# Patient Record
Sex: Male | Born: 1950 | Race: Black or African American | Hispanic: No | Smoking: Former smoker
Health system: Southern US, Community
[De-identification: ages and names within clinical notes are randomized; demographics above are authoritative.]

## PROBLEM LIST (undated history)

## (undated) DIAGNOSIS — L309 Dermatitis, unspecified: Secondary | ICD-10-CM

## (undated) DIAGNOSIS — F1911 Other psychoactive substance abuse, in remission: Secondary | ICD-10-CM

## (undated) DIAGNOSIS — N529 Male erectile dysfunction, unspecified: Secondary | ICD-10-CM

## (undated) DIAGNOSIS — J45909 Unspecified asthma, uncomplicated: Secondary | ICD-10-CM

## (undated) DIAGNOSIS — J302 Other seasonal allergic rhinitis: Secondary | ICD-10-CM

## (undated) DIAGNOSIS — K219 Gastro-esophageal reflux disease without esophagitis: Secondary | ICD-10-CM

## (undated) DIAGNOSIS — Z87828 Personal history of other (healed) physical injury and trauma: Secondary | ICD-10-CM

## (undated) DIAGNOSIS — M2550 Pain in unspecified joint: Secondary | ICD-10-CM

## (undated) DIAGNOSIS — M1990 Unspecified osteoarthritis, unspecified site: Secondary | ICD-10-CM

## (undated) DIAGNOSIS — G8929 Other chronic pain: Secondary | ICD-10-CM

## (undated) DIAGNOSIS — C61 Malignant neoplasm of prostate: Secondary | ICD-10-CM

## (undated) HISTORY — DX: Malignant neoplasm of prostate: C61

## (undated) HISTORY — DX: Male erectile dysfunction, unspecified: N52.9

## (undated) HISTORY — DX: Unspecified osteoarthritis, unspecified site: M19.90

---

## 1997-11-30 ENCOUNTER — Emergency Department (HOSPITAL_COMMUNITY): Admission: EM | Admit: 1997-11-30 | Discharge: 1997-11-30 | Payer: Self-pay | Admitting: Emergency Medicine

## 1998-05-13 ENCOUNTER — Emergency Department (HOSPITAL_COMMUNITY): Admission: EM | Admit: 1998-05-13 | Discharge: 1998-05-13 | Payer: Self-pay | Admitting: Emergency Medicine

## 1998-05-13 ENCOUNTER — Encounter: Payer: Self-pay | Admitting: Emergency Medicine

## 1998-08-08 ENCOUNTER — Emergency Department (HOSPITAL_COMMUNITY): Admission: EM | Admit: 1998-08-08 | Discharge: 1998-08-08 | Payer: Self-pay | Admitting: Emergency Medicine

## 1998-08-08 ENCOUNTER — Encounter: Payer: Self-pay | Admitting: Emergency Medicine

## 1999-02-04 ENCOUNTER — Emergency Department (HOSPITAL_COMMUNITY): Admission: EM | Admit: 1999-02-04 | Discharge: 1999-02-04 | Payer: Self-pay

## 2000-10-23 ENCOUNTER — Emergency Department (HOSPITAL_COMMUNITY): Admission: EM | Admit: 2000-10-23 | Discharge: 2000-10-23 | Payer: Self-pay | Admitting: Emergency Medicine

## 2001-04-27 ENCOUNTER — Emergency Department (HOSPITAL_COMMUNITY): Admission: EM | Admit: 2001-04-27 | Discharge: 2001-04-27 | Payer: Self-pay | Admitting: Emergency Medicine

## 2001-06-29 ENCOUNTER — Emergency Department (HOSPITAL_COMMUNITY): Admission: EM | Admit: 2001-06-29 | Discharge: 2001-06-29 | Payer: Self-pay | Admitting: Emergency Medicine

## 2003-11-08 ENCOUNTER — Emergency Department (HOSPITAL_COMMUNITY): Admission: EM | Admit: 2003-11-08 | Discharge: 2003-11-08 | Payer: Self-pay | Admitting: Emergency Medicine

## 2003-12-02 ENCOUNTER — Ambulatory Visit (HOSPITAL_COMMUNITY): Admission: RE | Admit: 2003-12-02 | Discharge: 2003-12-02 | Payer: Self-pay | Admitting: Family Medicine

## 2004-01-10 ENCOUNTER — Ambulatory Visit (HOSPITAL_COMMUNITY): Admission: RE | Admit: 2004-01-10 | Discharge: 2004-01-10 | Payer: Self-pay | Admitting: Orthopedic Surgery

## 2004-01-10 ENCOUNTER — Ambulatory Visit (HOSPITAL_BASED_OUTPATIENT_CLINIC_OR_DEPARTMENT_OTHER): Admission: RE | Admit: 2004-01-10 | Discharge: 2004-01-10 | Payer: Self-pay | Admitting: Orthopedic Surgery

## 2004-01-10 HISTORY — PX: ROTATOR CUFF REPAIR: SHX139

## 2004-02-21 ENCOUNTER — Ambulatory Visit: Payer: Self-pay | Admitting: Family Medicine

## 2004-03-02 ENCOUNTER — Encounter: Admission: RE | Admit: 2004-03-02 | Discharge: 2004-05-10 | Payer: Self-pay | Admitting: Orthopedic Surgery

## 2004-03-08 ENCOUNTER — Ambulatory Visit: Payer: Self-pay | Admitting: Family Medicine

## 2004-04-03 ENCOUNTER — Ambulatory Visit: Payer: Self-pay | Admitting: *Deleted

## 2004-04-03 ENCOUNTER — Ambulatory Visit: Payer: Self-pay | Admitting: Family Medicine

## 2004-10-18 ENCOUNTER — Ambulatory Visit: Payer: Self-pay | Admitting: Family Medicine

## 2004-11-20 ENCOUNTER — Ambulatory Visit: Payer: Self-pay | Admitting: Family Medicine

## 2005-06-12 ENCOUNTER — Ambulatory Visit: Payer: Self-pay | Admitting: Family Medicine

## 2005-06-13 ENCOUNTER — Encounter (INDEPENDENT_AMBULATORY_CARE_PROVIDER_SITE_OTHER): Payer: Self-pay | Admitting: Family Medicine

## 2005-07-11 ENCOUNTER — Encounter (INDEPENDENT_AMBULATORY_CARE_PROVIDER_SITE_OTHER): Payer: Self-pay | Admitting: Family Medicine

## 2005-07-11 LAB — CONVERTED CEMR LAB: PSA: 2.75 ng/mL

## 2005-07-12 ENCOUNTER — Ambulatory Visit: Payer: Self-pay | Admitting: Family Medicine

## 2005-09-19 ENCOUNTER — Emergency Department (HOSPITAL_COMMUNITY): Admission: EM | Admit: 2005-09-19 | Discharge: 2005-09-19 | Payer: Self-pay | Admitting: Emergency Medicine

## 2005-09-28 ENCOUNTER — Ambulatory Visit: Payer: Self-pay | Admitting: *Deleted

## 2005-11-28 ENCOUNTER — Emergency Department (HOSPITAL_COMMUNITY): Admission: EM | Admit: 2005-11-28 | Discharge: 2005-11-28 | Payer: Self-pay | Admitting: Emergency Medicine

## 2005-12-26 ENCOUNTER — Emergency Department (HOSPITAL_COMMUNITY): Admission: EM | Admit: 2005-12-26 | Discharge: 2005-12-26 | Payer: Self-pay | Admitting: Emergency Medicine

## 2006-01-02 ENCOUNTER — Ambulatory Visit: Payer: Self-pay | Admitting: Family Medicine

## 2006-03-06 ENCOUNTER — Emergency Department (HOSPITAL_COMMUNITY): Admission: EM | Admit: 2006-03-06 | Discharge: 2006-03-06 | Payer: Self-pay | Admitting: Emergency Medicine

## 2006-03-14 ENCOUNTER — Ambulatory Visit: Payer: Self-pay | Admitting: Family Medicine

## 2006-06-10 ENCOUNTER — Ambulatory Visit: Payer: Self-pay | Admitting: Family Medicine

## 2006-06-18 ENCOUNTER — Ambulatory Visit (HOSPITAL_COMMUNITY): Admission: RE | Admit: 2006-06-18 | Discharge: 2006-06-18 | Payer: Self-pay | Admitting: Family Medicine

## 2006-09-26 ENCOUNTER — Emergency Department (HOSPITAL_COMMUNITY): Admission: EM | Admit: 2006-09-26 | Discharge: 2006-09-26 | Payer: Self-pay | Admitting: Emergency Medicine

## 2006-10-04 ENCOUNTER — Ambulatory Visit: Payer: Self-pay | Admitting: Family Medicine

## 2006-11-02 ENCOUNTER — Emergency Department (HOSPITAL_COMMUNITY): Admission: EM | Admit: 2006-11-02 | Discharge: 2006-11-03 | Payer: Self-pay | Admitting: Emergency Medicine

## 2006-11-22 ENCOUNTER — Encounter (INDEPENDENT_AMBULATORY_CARE_PROVIDER_SITE_OTHER): Payer: Self-pay | Admitting: Family Medicine

## 2006-11-22 DIAGNOSIS — N400 Enlarged prostate without lower urinary tract symptoms: Secondary | ICD-10-CM | POA: Insufficient documentation

## 2006-11-22 DIAGNOSIS — Z87448 Personal history of other diseases of urinary system: Secondary | ICD-10-CM | POA: Insufficient documentation

## 2006-11-22 DIAGNOSIS — J45909 Unspecified asthma, uncomplicated: Secondary | ICD-10-CM | POA: Insufficient documentation

## 2006-11-22 DIAGNOSIS — M5137 Other intervertebral disc degeneration, lumbosacral region: Secondary | ICD-10-CM

## 2006-11-26 DIAGNOSIS — M169 Osteoarthritis of hip, unspecified: Secondary | ICD-10-CM

## 2006-11-26 DIAGNOSIS — M1710 Unilateral primary osteoarthritis, unspecified knee: Secondary | ICD-10-CM

## 2006-11-26 DIAGNOSIS — E1165 Type 2 diabetes mellitus with hyperglycemia: Secondary | ICD-10-CM

## 2007-01-01 ENCOUNTER — Encounter (INDEPENDENT_AMBULATORY_CARE_PROVIDER_SITE_OTHER): Payer: Self-pay | Admitting: *Deleted

## 2007-04-29 ENCOUNTER — Ambulatory Visit: Payer: Self-pay | Admitting: Family Medicine

## 2007-04-29 LAB — CONVERTED CEMR LAB
ALT: 12 units/L (ref 0–53)
AST: 10 units/L (ref 0–37)
Albumin: 4 g/dL (ref 3.5–5.2)
Alkaline Phosphatase: 83 units/L (ref 39–117)
Basophils Relative: 0 % (ref 0–1)
Calcium: 9.4 mg/dL (ref 8.4–10.5)
Chloride: 107 meq/L (ref 96–112)
Eosinophils Relative: 3 % (ref 0–5)
Glucose, Bld: 187 mg/dL — ABNORMAL HIGH (ref 70–99)
HDL: 70 mg/dL (ref >39)
Lymphocytes Relative: 34 % (ref 12–46)
MCHC: 32.2 g/dL (ref 30.0–36.0)
MCV: 91.1 fL (ref 78.0–100.0)
Microalb, Ur: 0.37 mg/dL (ref 0.00–1.89)
Monocytes Absolute: 0.6 10*3/uL (ref 0.1–1.0)
Neutro Abs: 3.8 10*3/uL (ref 1.7–7.7)
Neutrophils Relative %: 55 % (ref 43–77)
Platelets: 284 10*3/uL (ref 150–400)
Sodium: 141 meq/L (ref 135–145)
Total Protein: 7.3 g/dL (ref 6.0–8.3)
Triglycerides: 103 mg/dL (ref <150)
VLDL: 21 mg/dL (ref 0–40)

## 2007-09-18 ENCOUNTER — Ambulatory Visit: Payer: Self-pay | Admitting: Internal Medicine

## 2008-01-02 ENCOUNTER — Ambulatory Visit: Payer: Self-pay | Admitting: Internal Medicine

## 2008-01-23 ENCOUNTER — Ambulatory Visit: Payer: Self-pay | Admitting: Family Medicine

## 2008-06-08 ENCOUNTER — Emergency Department (HOSPITAL_COMMUNITY): Admission: EM | Admit: 2008-06-08 | Discharge: 2008-06-08 | Payer: Self-pay | Admitting: Emergency Medicine

## 2008-06-18 ENCOUNTER — Emergency Department (HOSPITAL_COMMUNITY): Admission: EM | Admit: 2008-06-18 | Discharge: 2008-06-18 | Payer: Self-pay | Admitting: Emergency Medicine

## 2008-06-24 ENCOUNTER — Ambulatory Visit: Payer: Self-pay | Admitting: Family Medicine

## 2008-10-14 ENCOUNTER — Ambulatory Visit: Payer: Self-pay | Admitting: Family Medicine

## 2008-12-17 ENCOUNTER — Ambulatory Visit: Payer: Self-pay | Admitting: Family Medicine

## 2008-12-30 ENCOUNTER — Ambulatory Visit: Payer: Self-pay | Admitting: Family Medicine

## 2009-05-06 ENCOUNTER — Ambulatory Visit: Payer: Self-pay | Admitting: Family Medicine

## 2009-11-18 ENCOUNTER — Ambulatory Visit: Payer: Self-pay | Admitting: Family Medicine

## 2009-11-18 LAB — CONVERTED CEMR LAB
ALT: 21 units/L (ref 0–53)
Albumin: 4.3 g/dL (ref 3.5–5.2)
Alkaline Phosphatase: 51 units/L (ref 39–117)
BUN: 23 mg/dL (ref 6–23)
Basophils Absolute: 0 10*3/uL (ref 0.0–0.1)
Basophils Relative: 0 % (ref 0–1)
Cholesterol: 163 mg/dL (ref 0–200)
Creatinine, Ser: 0.94 mg/dL (ref 0.40–1.50)
Eosinophils Absolute: 0.2 10*3/uL (ref 0.0–0.7)
Glucose, Bld: 131 mg/dL — ABNORMAL HIGH (ref 70–99)
Lymphs Abs: 2.1 10*3/uL (ref 0.7–4.0)
MCHC: 31.8 g/dL (ref 30.0–36.0)
MCV: 92.8 fL (ref 78.0–100.0)
PSA: 4.52 ng/mL — ABNORMAL HIGH (ref 0.10–4.00)
Potassium: 4.6 meq/L (ref 3.5–5.3)
Sodium: 142 meq/L (ref 135–145)
Total Bilirubin: 0.3 mg/dL (ref 0.3–1.2)
Total CHOL/HDL Ratio: 2.8
Total Protein: 6.9 g/dL (ref 6.0–8.3)
VLDL: 13 mg/dL (ref 0–40)
WBC: 6.9 10*3/uL (ref 4.0–10.5)

## 2009-11-22 ENCOUNTER — Ambulatory Visit (HOSPITAL_COMMUNITY): Admission: RE | Admit: 2009-11-22 | Discharge: 2009-11-22 | Payer: Self-pay | Admitting: Family Medicine

## 2010-05-07 ENCOUNTER — Encounter: Payer: Self-pay | Admitting: Family Medicine

## 2010-05-09 ENCOUNTER — Encounter (INDEPENDENT_AMBULATORY_CARE_PROVIDER_SITE_OTHER): Payer: Self-pay | Admitting: Family Medicine

## 2010-05-09 LAB — CONVERTED CEMR LAB: Microalb, Ur: 0.5 mg/dL (ref 0.00–1.89)

## 2010-09-01 NOTE — Op Note (Signed)
NAMEOSRIC, KLOPF            ACCOUNT NO.:  [id number]   MEDICAL RECORD NO.:  [id number]          PATIENT TYPE:  AMB   LOCATION:  DSC                          FACILITY:  [facility]   PHYSICIAN:  John L. Rendall III, M.D.DATE OF BIRTH:  [birth year]   DATE OF PROCEDURE:  01/10/2004  DATE OF DISCHARGE:                                 OPERATIVE REPORT   PREOPERATIVE DIAGNOSIS:  Massive rotator cuff tear, right shoulder with  retraction--chronic.   POSTOPERATIVE DIAGNOSIS:  Massive rotator cuff tear, right shoulder with  retraction--chronic.   OPERATION PERFORMED:  Acromioplasty and repair of  chronic massive rotator  cuff tear.   SURGEON:  John L. Rendall, M.D.   ANESTHESIA:  General.   DESCRIPTION OF PROCEDURE:  Under general anesthesia, the patient was placed  semisitting with a bump under the shoulder and the arm was prepared with  DuraPrep and draped as a sterile field.  An oblique 3 inch incision was made  beginning at the anterolateral acromion extending in the line of the deltoid  fibers.  Dissection was carried through skin and subcutaneous tissue.  The  deltoid was taken down from the anterolateral acromion with electrocautery.  The deltoid was split for approximately 3.5 cm.  An acromioplasty was  carried out.  Following this, an extensive bursectomy was carried out.  About 10% of the deep fibers of the cuff were found to be still attached to  the greater tuberosity but 95% of the tendon was totally avulsed and  retracted posteriorly.  It was brought forward.  The end was freshened.  A  trough was made at the greater tuberosity.  Four drill holes were made and  two OrthoCore sutures were placed in a modified Tujima stitch pulling the  tendon down into the trough.  It is sort of an L-shaped avulsion and the  side-to-side repair with the supraspinatus anteriorly was repaired side-to-  side with #2 Ethibond.  Once this was completed, the shoulder was stable  through a good  range of motion.  The deltoid was then reattached to the  acromion with 2-0 Ethibond.  The deltoid fibers were reapproximated with 2-0  Vicryl, subcutaneous with 2-0 Vicryl and skin with clips.   OPERATIVE TIME:  Approximately one hour.   The shoulder has a shoulder block and it was injected with Marcaine with  epinephrine with 4 mg of morphine.  Shoulder immobilizer was applied.  The  patient was kept overnight.  He was given preoperative Kefzol to be released  in the morning with Percocet to return to the office in one week.       JLR/MEDQ  D:  01/10/2004  T:  01/10/2004  Job:  [id number]

## 2011-01-23 ENCOUNTER — Other Ambulatory Visit: Payer: Self-pay | Admitting: Gastroenterology

## 2011-01-23 ENCOUNTER — Ambulatory Visit (HOSPITAL_COMMUNITY)
Admission: RE | Admit: 2011-01-23 | Discharge: 2011-01-23 | Disposition: A | Discharge status: Home / Self Care | Payer: Medicare Other | Source: Ambulatory Visit | Attending: Gastroenterology | Admitting: Gastroenterology

## 2011-01-23 DIAGNOSIS — N400 Enlarged prostate without lower urinary tract symptoms: Secondary | ICD-10-CM | POA: Insufficient documentation

## 2011-01-23 DIAGNOSIS — D126 Benign neoplasm of colon, unspecified: Secondary | ICD-10-CM | POA: Insufficient documentation

## 2011-01-23 DIAGNOSIS — E119 Type 2 diabetes mellitus without complications: Secondary | ICD-10-CM | POA: Insufficient documentation

## 2011-01-23 DIAGNOSIS — I10 Essential (primary) hypertension: Secondary | ICD-10-CM | POA: Insufficient documentation

## 2011-01-23 DIAGNOSIS — J45909 Unspecified asthma, uncomplicated: Secondary | ICD-10-CM | POA: Insufficient documentation

## 2011-01-23 DIAGNOSIS — Z1211 Encounter for screening for malignant neoplasm of colon: Secondary | ICD-10-CM | POA: Insufficient documentation

## 2011-01-23 LAB — GLUCOSE, CAPILLARY: Glucose-Capillary: 104 mg/dL — ABNORMAL HIGH (ref 70–99)

## 2011-06-15 ENCOUNTER — Encounter: Payer: Self-pay | Admitting: *Deleted

## 2011-06-15 DIAGNOSIS — C61 Malignant neoplasm of prostate: Secondary | ICD-10-CM | POA: Insufficient documentation

## 2011-06-15 DIAGNOSIS — I10 Essential (primary) hypertension: Secondary | ICD-10-CM | POA: Insufficient documentation

## 2011-06-15 DIAGNOSIS — M1990 Unspecified osteoarthritis, unspecified site: Secondary | ICD-10-CM | POA: Insufficient documentation

## 2011-06-15 NOTE — Progress Notes (Signed)
Path:05/01/11= Prostate ZOX:[id number], Gleason=3+3=6,Volume=68.8cc,PSA=7.40 PSA 2012=7.40 2011=4.52 2009=3.25 2007=2.75 No Dysuria, does have frequency voiding, has arthritic pain generalized, has knot left forehead above eyebrow,states:calcium deoposit"  Single, 4sons,2 daughters,Maternal 2  Uncles Prostate Ca,  Allergies:NKDA

## 2011-06-18 ENCOUNTER — Ambulatory Visit
Admission: RE | Admit: 2011-06-18 | Discharge: 2011-06-18 | Disposition: A | Discharge status: Home / Self Care | Payer: Medicare Other | Source: Ambulatory Visit | Attending: Radiation Oncology | Admitting: Radiation Oncology

## 2011-06-18 ENCOUNTER — Encounter: Payer: Self-pay | Admitting: Radiation Oncology

## 2011-06-18 DIAGNOSIS — Z8042 Family history of malignant neoplasm of prostate: Secondary | ICD-10-CM | POA: Insufficient documentation

## 2011-06-18 DIAGNOSIS — Z7982 Long term (current) use of aspirin: Secondary | ICD-10-CM | POA: Insufficient documentation

## 2011-06-18 DIAGNOSIS — E119 Type 2 diabetes mellitus without complications: Secondary | ICD-10-CM | POA: Insufficient documentation

## 2011-06-18 DIAGNOSIS — C61 Malignant neoplasm of prostate: Secondary | ICD-10-CM | POA: Insufficient documentation

## 2011-06-18 DIAGNOSIS — Z79899 Other long term (current) drug therapy: Secondary | ICD-10-CM | POA: Insufficient documentation

## 2011-06-18 DIAGNOSIS — I10 Essential (primary) hypertension: Secondary | ICD-10-CM | POA: Insufficient documentation

## 2011-06-18 NOTE — Progress Notes (Signed)
Please see the Nurse Progress Note in the MD Initial Consult Encounter for this patient. 

## 2011-06-18 NOTE — Progress Notes (Signed)
Encounter addended by: Lowella Petties, RN on: 06/18/2011  2:51 PM<BR>     Documentation filed: Visit Diagnoses, Notes Section

## 2011-06-18 NOTE — Progress Notes (Signed)
Radiation Oncology         [phone] ________________________________  Initial outpatient Consultation  Name: Cole Conrad MRN: [id number]  Date: 06/18/2011  DOB: 1950-06-22  CC:No primary provider on file.  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 male who is is followed by the health service clinic. He had a PSA level 2.75 on 07/12/2005. This increased to 3.25 on 04/29/2007. His PSA increased further to 4.52 on 11/20/2009. In short interval followup, his PSA on 05/09/2010 increased to 7.4. The patient was counseled referred to light urology rate was evaluated on 11/29/2010. Digital rectal exam at that time revealed a 3+ prostate with no nodules.  The patient proceeded to transrectal ultrasound with prostate biopsy under the care of Dr. Vernie Ammons on January 15 of 2013. The prostate volume was measured to be 68.8 cubic centimeter. There were some hypoechoic lesions in the right mid gland. 12 core biopsies were obtained in total and 2/12 were positive in the left lateral base and left lateral mid sections at 10% and 5% involvement, respectively. The patient reviewed the biopsy results with Dr. Vernie Ammons, and he is currently been referred today for discussion of possible radiation treatment options.  PREVIOUS RADIATION THERAPY: No  PAST MEDICAL HISTORY:  has a past medical history of Prostate cancer (05/01/2011 BX); Arthritis; Asthma; Diabetes mellitus; Hypertension; ED (erectile dysfunction); DJD (degenerative joint disease); and DJD (degenerative joint disease).    PAST SURGICAL HISTORY: Past Surgical History  Procedure Date  . Rotator cuff repair   . Prostate biopsy     FAMILY HISTORY: family history includes Prostate cancer in his maternal uncle.  SOCIAL HISTORY:  reports that he has never smoked.  He does not have any smokeless tobacco history on file. He reports that he drinks alcohol. He reports that he does not use illicit drugs.  ALLERGIES: Review of patient's allergies indicates no known allergies.  MEDICATIONS:  Current Outpatient Prescriptions  Medication Sig Dispense Refill  . aspirin 81 MG tablet Take 81 mg by mouth daily.      . famotidine (PEPCID) 20 MG tablet Take 20 mg by mouth daily.      Marland Kitchen glimepiride (AMARYL) 4 MG tablet Take 4 mg by mouth daily before breakfast.      . lisinopril (PRINIVIL,ZESTRIL) 5 MG tablet Take 5 mg by mouth daily.      . metformin (FORTAMET) 1000 MG (OSM) 24 hr tablet Take 1,000 mg by mouth 2 (two) times daily with a meal.       . Multiple Vitamins-Minerals (MULTIVITAMIN PO) Take 1 tablet by mouth daily.      . naproxen sodium (ANAPROX) 220 MG tablet Take 220 mg by mouth as needed.      . traMADol (ULTRAM) 50 MG tablet Take 50 mg by mouth every 6 (six) hours as needed.        REVIEW OF SYSTEMS:  A 15 point review of systems is documented in the electronic medical record. This was obtained by the nursing staff. However, I reviewed this with the patient to discuss relevant findings and make appropriate changes.  Pertinent items are noted in HPI.  the patient did fill out a urinary symptom questionnaire. He described awaking 3 times nightly with nocturia and had an overall score of 16 on the IP SS scale suggesting moderate  urinary outflow chart of symptoms. He also fill out the erectile function questionnaire indicating that he remains sexually active and is able to achieve erections capable of completion of sexual activity at or above half the time.   PHYSICAL EXAM:  weight is 210 lb 4.8 oz (95.391 kg). His oral temperature is 99.4 F (37.4 C). His blood pressure is 120/74 and his pulse is 68. His respiration is 20.   Patient is in no acute distress today. Is alert and oriented. Please note the digital rectal exam findings described above. No prostate  nodules were noted.  LABORATORY DATA:  Lab Results  Component Value Date   WBC 6.9 11/18/2009   HGB 12.7* 11/18/2009   HCT 40.0 11/18/2009   MCV 92.8 11/18/2009   PLT 268 11/18/2009   Lab Results  Component Value Date   NA 142 11/18/2009   K 4.6 11/18/2009   CL 108 11/18/2009   CO2 20 11/18/2009   Lab Results  Component Value Date   ALT 21 11/18/2009   AST 14 11/18/2009   ALKPHOS 51 11/18/2009   BILITOT 0.3 11/18/2009     RADIOGRAPHY: No results found.    IMPRESSION: The patient is a very nice 61 year old gentleman with stage TI C. adenocarcinoma prostate Gleason score 3+3 PSA of 7.4. He falls into in the favorable risk group and is eligible for a variety of treatment options including active surveillance, prostate brachy therapy, prostatectomy, and external beam radiation treatment.  He does have a relatively large prostate gland but only moderate urinary symptoms and no medical therapy for obstructive symptoms as yet. In that sense, I do not think that he would necessarily require neoadjuvant Lupron therapy. Rather, it may be worth considering initiation of Avodart for modest gland downsizing along with possible Flomax or other urinary symptom medication.  PLAN: Today I reviewed with Cole Conrad the findings and workup thus far.  We discussed the natural history of prostate cancer.  We reviewed the the implications of T-stage, Gleason's Score, and PSA on decision-making and outcomes in prostate cancer.  We discussed radiation treatment in the management of prostate cancer with regard to the logistics and delivery of external beam radiation treatment as well as the logistics and delivery of prostate brachytherapy.  We compared and contrasted each of these approaches and also compared these against prostatectomy.  The patient expressed interest in prostate brachytherapy.  I filled out a patient counseling form for him with relevant treatment diagrams and we retained a copy for our records.   The  patient would like to proceed with prostate brachytherapy.  I will move forward with scheduling the procedure in the near future.     I enjoyed meeting Cole Conrad today.  I will look forward to participating in the care of this very nice gentleman.   I spent 60 minutes minutes face to face with the patient and more than 50% of that time was spent in counseling and/or coordination of care.   ------------------------------------------------  Artist Pais. Kathrynn Running, M.D.

## 2011-06-19 ENCOUNTER — Telehealth: Payer: Self-pay | Admitting: *Deleted

## 2011-06-19 NOTE — Telephone Encounter (Signed)
XXX

## 2011-06-26 ENCOUNTER — Ambulatory Visit (HOSPITAL_BASED_OUTPATIENT_CLINIC_OR_DEPARTMENT_OTHER)
Admission: RE | Admit: 2011-06-26 | Discharge: 2011-06-26 | Disposition: A | Discharge status: Home / Self Care | Payer: Medicare Other | Source: Ambulatory Visit | Attending: Urology | Admitting: Urology

## 2011-06-26 ENCOUNTER — Other Ambulatory Visit: Payer: Self-pay

## 2011-06-26 ENCOUNTER — Ambulatory Visit
Admission: RE | Admit: 2011-06-26 | Discharge: 2011-06-26 | Disposition: A | Discharge status: Home / Self Care | Payer: Medicare Other | Source: Ambulatory Visit | Attending: Anesthesiology | Admitting: Anesthesiology

## 2011-06-26 DIAGNOSIS — Z7982 Long term (current) use of aspirin: Secondary | ICD-10-CM | POA: Insufficient documentation

## 2011-06-26 DIAGNOSIS — C61 Malignant neoplasm of prostate: Secondary | ICD-10-CM | POA: Insufficient documentation

## 2011-06-26 DIAGNOSIS — Z79899 Other long term (current) drug therapy: Secondary | ICD-10-CM | POA: Insufficient documentation

## 2011-06-26 DIAGNOSIS — E119 Type 2 diabetes mellitus without complications: Secondary | ICD-10-CM | POA: Insufficient documentation

## 2011-06-26 DIAGNOSIS — I10 Essential (primary) hypertension: Secondary | ICD-10-CM | POA: Insufficient documentation

## 2011-06-28 ENCOUNTER — Telehealth: Payer: Self-pay | Admitting: *Deleted

## 2011-06-28 NOTE — Telephone Encounter (Signed)
XXXX 

## 2011-06-29 ENCOUNTER — Ambulatory Visit
Admission: RE | Admit: 2011-06-29 | Discharge: 2011-06-29 | Disposition: A | Discharge status: Home / Self Care | Payer: Medicare Other | Source: Ambulatory Visit | Attending: Radiation Oncology | Admitting: Radiation Oncology

## 2011-06-29 DIAGNOSIS — C61 Malignant neoplasm of prostate: Secondary | ICD-10-CM

## 2011-07-02 ENCOUNTER — Encounter: Payer: Self-pay | Admitting: Radiation Oncology

## 2011-07-02 NOTE — Progress Notes (Signed)
  Radiation Oncology         [phone] ________________________________  Name: Saurav Crumble MRN: [id number]  Date: 07/02/2011  DOB: [birth year]  SIMULATION AND TREATMENT PLANNING NOTE PUBIC ARCH STUDY  CC:No primary provider on file.  Garnett Farm, MD  DIAGNOSIS: 61 year old gentlemen with stage T1c adenocarcinoma of the prostate with a Gleason of 3+3 and a PSA of 7.4.  NARRATIVE:  The patient presented today for evaluation for possible prostate seed implant. He was brought to the radiation planning suite and placed supine on the CT couch. A 3-dimensional image study set was obtained in upload to the planning computer. There, on each axial slice, I contoured the prostate gland. Then, using three-dimensional radiation planning tools I reconstructed the prostate in view of the structures from the transperineal needle pathway to assess for possible pubic arch interference. In doing so, I did not appreciate any pubic arch interference. Also, the patient's prostate volume was estimated based on the drawn structure. The volume was 55 cc.  Given the pubic arch appearance and prostate volume, patient remains a good candidate to proceed with prostate seed implant. Today, he freely provided informed written consent to proceed.    PLAN: The patient will undergo prostate seed implant to 145 Gy.   ________________________________  Artist Pais. Kathrynn Running, M.D.

## 2011-07-26 ENCOUNTER — Telehealth: Payer: Self-pay | Admitting: *Deleted

## 2011-07-26 NOTE — Telephone Encounter (Signed)
CALLED PATIENT TO INFORM THAT IMPLANT DATE BEING MOVED TO MAY 17 AT 9:30 AM, SPOKE W/PATIENT AND HE IS AWARE OF IMPLANT BEING MOVED

## 2011-08-30 ENCOUNTER — Telehealth: Payer: Self-pay | Admitting: *Deleted

## 2011-08-30 NOTE — Telephone Encounter (Signed)
Called patient to remind of appt., spoke with patient and he is aware of this appt. 

## 2011-08-31 LAB — COMPREHENSIVE METABOLIC PANEL
ALT: 33 U/L (ref 0–53)
AST: 23 U/L (ref 0–37)
Albumin: 3.7 g/dL (ref 3.5–5.2)
Alkaline Phosphatase: 63 U/L (ref 39–117)
BUN: 21 mg/dL (ref 6–23)
CO2: 25 mEq/L (ref 19–32)
Calcium: 9 mg/dL (ref 8.4–10.5)
Chloride: 104 mEq/L (ref 96–112)
Creatinine, Ser: 1.04 mg/dL (ref 0.50–1.35)
GFR calc Af Amer: 90 mL/min — ABNORMAL LOW (ref >90)
GFR calc non Af Amer: 77 mL/min — ABNORMAL LOW (ref >90)
Glucose, Bld: 81 mg/dL (ref 70–99)
Potassium: 4.2 mEq/L (ref 3.5–5.1)
Sodium: 138 mEq/L (ref 135–145)
Total Bilirubin: 0.3 mg/dL (ref 0.3–1.2)
Total Protein: 7.3 g/dL (ref 6.0–8.3)

## 2011-08-31 LAB — CBC
HCT: 39.5 % (ref 39.0–52.0)
Hemoglobin: 13.7 g/dL (ref 13.0–17.0)
MCH: 30.6 pg (ref 26.0–34.0)
MCHC: 34.7 g/dL (ref 30.0–36.0)
MCV: 88.2 fL (ref 78.0–100.0)
Platelets: 266 10*3/uL (ref 150–400)
RBC: 4.48 MIL/uL (ref 4.22–5.81)
RDW: 12.9 % (ref 11.5–15.5)
WBC: 7.9 10*3/uL (ref 4.0–10.5)

## 2011-08-31 LAB — APTT: aPTT: 29 seconds (ref 24–37)

## 2011-08-31 LAB — PROTIME-INR
INR: 0.94 (ref 0.00–1.49)
Prothrombin Time: 12.8 seconds (ref 11.6–15.2)

## 2011-09-03 ENCOUNTER — Encounter (HOSPITAL_BASED_OUTPATIENT_CLINIC_OR_DEPARTMENT_OTHER): Payer: Self-pay | Admitting: *Deleted

## 2011-09-03 NOTE — Progress Notes (Signed)
NPO AFTER MN. ARRIVES AT 0945. CURRENT LAB RESULTS , EKG AND CXR IN EPIC. WILL TAKE PRILOSEC AM OF SURG. W/  SIP OF WATER AND DO FLEET ENEMA.

## 2011-09-04 NOTE — Progress Notes (Signed)
Encounter addended by: Lowella Petties, RN on: 09/04/2011 10:17 AM<BR>     Documentation filed: Charges VN

## 2011-09-06 ENCOUNTER — Telehealth: Payer: Self-pay | Admitting: *Deleted

## 2011-09-06 NOTE — Telephone Encounter (Signed)
CALLED PATIENT TO REMIND OF PROCEDURE FOR 09-07-11, CONFIRMED PROCEDURE WITH PATIENT.

## 2011-09-06 NOTE — H&P (Signed)
History of Present Illness   Adenocarcinoma of the prostate: He was found to have a rising PSA. He was noted on DRE to have no nodularity or induration however his prostate was noted to be enlarged.  PSAs:  2007 - 2.75 2009 - 3.25 2011 - 4.52 2012 - 7.40    On 04/30/10 he underwent TRUS/BX which revealed a 68.8 cc prostate with no obvious lesions.  Pathology: adenocarcinoma Gleason score 3+3 = 6 in 2/12 cores from the posterior left lobe of the prostate with one core positive for 10% and the second core positive for 5%.   Past Medical History Problems  1. History of  Arthritis V13.4 2. History of  Asthma 493.90 3. History of  Diabetes Mellitus 250.00 4. History of  Hypertension 401.9  Surgical History Problems  1. History of  Biopsy Of The Prostate Needle 2. History of  Rotator Cuff Repair  Current Meds 1. Aleve TABS; Therapy: (Recorded:15Aug2012) to 2. Aspirin 81 MG Oral Tablet; Therapy: (Recorded:15Aug2012) to 3. Famotidine 20 MG Oral Tablet; Therapy: (Recorded:15Aug2012) to 4. Glimepiride 4 MG Oral Tablet; Therapy: (Recorded:15Aug2012) to 5. Lisinopril 5 MG Oral Tablet; Therapy: (Recorded:15Aug2012) to 6. MetFORMIN HCl 1000 MG Oral Tablet; Therapy: (Recorded:15Aug2012) to 7. Multi-Vitamin TABS; Therapy: (Recorded:15Aug2012) to 8. TraMADol HCl 50 MG Oral Tablet; Therapy: 05Nov2012 to  Allergies Medication  1. No Known Drug Allergies  Family History Problems  1. Family history of  Death In The Family Father passed at age 2 from heart problems 2. Maternal uncle's history of  Diabetes Mellitus V18.0 3. Maternal aunt's history of  Diabetes Mellitus V18.0 4. Family history of  Family Health Status Number Of Children 4 sons and 2 daughters 5. Maternal history of  Hypertension V17.49 6. Son's history of  Hypertension V17.49 7. Maternal uncle's history of  Prostate Cancer V16.42  Social History Problems  1. Caffeine Use 2 glasses per day 2. Marital History -  Single 3. Never A Smoker 4. Unemployed Denied  5. History of  Alcohol Use  Results/Data      Partin table results: Probability that this is indolent cancer is 14%. His probability of organ confined disease is 83%, the probability of extracapsular extension is 10%, the probability of seminal vesicle involvement is 3% and there is a 1.6% probability of lymph node involvement. His five-year progression free probability with brachytherapy is 85% and with radical prostatectomy he is 5 and 10 year progression free probability is 97 and 96% respectively.  Review of Systems Genitourinary, constitutional, skin, eye, otolaryngeal, hematologic/lymphatic, cardiovascular, pulmonary, endocrine, musculoskeletal, gastrointestinal, neurological and psychiatric system(s) were reviewed and pertinent findings if present are noted.  Genitourinary: urinary frequency, nocturia and urinary stream starts and stops.  Integumentary: skin rash/lesion and pruritus.  ENT: sinus problems.  Musculoskeletal: back pain and joint pain.    Vitals Vital Signs  BMI Calculated: 32.51 BSA Calculated: 2.13 Height: 5 ft 8.5 in Weight: 217 lb  Blood Pressure: 128 / 78 Temperature: 98 F Heart Rate: 70  Physical Exam Constitutional: Well nourished and well developed . No acute distress. The patient appears well hydrated.  ENT:. The ears and nose are normal in appearance.  Neck: The appearance of the neck is normal.  Pulmonary: No respiratory distress.  Cardiovascular: Heart rate and rhythm are normal.  Abdomen: The abdomen is flat. The abdomen is soft and nontender. No suprapubic tenderness. No CVA tenderness. Bowel sounds are normal. No hernias are palpable. No hepatosplenomegaly noted.  Rectal: Rectal exam demonstrates rectal tenderness and  an external hemorrhoid, but normal sphincter tone and the anus is normal on inspection. Estimated prostate size is 3+. Normal rectal tone, no rectal masses, prostate is smooth, symmetric  and non-tender. The prostate has no nodularity, is not indurated, is not tender and is not fluctuant. The perineum is normal on inspection, no perineal tenderness.  Genitourinary: Examination of the penis demonstrates a normal meatus. The penis is circumcised. The scrotum is normal in appearance. The right testis is palpably normal, not enlarged and non-tender. The left testis is normal, not enlarged and non-tender.  Lymphatics: The femoral and inguinal nodes are not enlarged or tender.  Skin: Normal skin turgor and normal skin color and pigmentation.  Neuro/Psych:. Mood and affect are appropriate.    Assessment Assessed  1. Adenocarcinoma Of The Prostate Gland 185 1    Plan   Discussion/Summary       The patient was counseled about the natural history of prostate cancer and the standard treatment options that are available for prostate cancer. It was explained to him how his age and life expectancy, clinical stage, Gleason score, and PSA affect his prognosis, the decision to proceed with additional staging studies, as well as how that information influences recommended treatment strategies. We discussed the roles for active surveillance, radiation therapy, surgical therapy, androgen deprivation, as well as ablative therapy options for the treatment of prostate cancer as appropriate to his individual cancer situation. We discussed the risks and benefits of these options with regard to their impact on cancer control and also in terms of potential adverse events, complications, and impact on quiality of life particularly related to urinary, bowel, and sexual function. The patient was encouraged to ask questions throughout the discussion today and all questions were answered to his stated satisfaction. In addition, the patient was provided with and/or directed to appropriate resources and literature for further education about prostate cancer and treatment options.   45/50 minutes were spent in face  to face consultation with patient today.     I went over all the treatment options with him again. He did not want to consider active surveillance. We discussed surgery versus radiation and he was more interested in radiation. We discussed external beam versus radioactive seeds. He wanted to know about seeds primarily so we discussed this at length. His prostate measured 69 cc and therefore we discussed downsizing the prostate with Lupron and its potential benefits and side effects. I am not 100% sure that downsizing would be necessary however I'm going to schedule him an appointment with Dr. Kathrynn Running and then after he has reviewed the case a decision will be made as to whether downsizing we'll be necessary prior to radioactive seed implantation which is how he would like to proceed.

## 2011-09-07 ENCOUNTER — Ambulatory Visit (HOSPITAL_COMMUNITY): Payer: Medicare Other

## 2011-09-07 ENCOUNTER — Encounter (HOSPITAL_BASED_OUTPATIENT_CLINIC_OR_DEPARTMENT_OTHER): Payer: Self-pay | Admitting: Anesthesiology

## 2011-09-07 ENCOUNTER — Encounter (HOSPITAL_BASED_OUTPATIENT_CLINIC_OR_DEPARTMENT_OTHER)
Admission: RE | Disposition: A | Discharge status: Home / Self Care | Payer: Self-pay | Source: Ambulatory Visit | Attending: Urology

## 2011-09-07 ENCOUNTER — Ambulatory Visit (HOSPITAL_BASED_OUTPATIENT_CLINIC_OR_DEPARTMENT_OTHER): Payer: Medicare Other | Admitting: Anesthesiology

## 2011-09-07 ENCOUNTER — Encounter (HOSPITAL_BASED_OUTPATIENT_CLINIC_OR_DEPARTMENT_OTHER): Payer: Self-pay | Admitting: *Deleted

## 2011-09-07 ENCOUNTER — Ambulatory Visit (HOSPITAL_BASED_OUTPATIENT_CLINIC_OR_DEPARTMENT_OTHER)
Admission: RE | Admit: 2011-09-07 | Discharge: 2011-09-07 | Disposition: A | Discharge status: Home / Self Care | Payer: Medicare Other | Source: Ambulatory Visit | Attending: Urology | Admitting: Urology

## 2011-09-07 DIAGNOSIS — I10 Essential (primary) hypertension: Secondary | ICD-10-CM | POA: Insufficient documentation

## 2011-09-07 DIAGNOSIS — C61 Malignant neoplasm of prostate: Secondary | ICD-10-CM | POA: Insufficient documentation

## 2011-09-07 DIAGNOSIS — E119 Type 2 diabetes mellitus without complications: Secondary | ICD-10-CM | POA: Insufficient documentation

## 2011-09-07 DIAGNOSIS — Z7982 Long term (current) use of aspirin: Secondary | ICD-10-CM | POA: Insufficient documentation

## 2011-09-07 DIAGNOSIS — Z79899 Other long term (current) drug therapy: Secondary | ICD-10-CM | POA: Insufficient documentation

## 2011-09-07 HISTORY — DX: Dermatitis, unspecified: L30.9

## 2011-09-07 HISTORY — PX: RADIOACTIVE SEED IMPLANT: SHX5150

## 2011-09-07 HISTORY — DX: Personal history of other (healed) physical injury and trauma: Z87.828

## 2011-09-07 HISTORY — DX: Pain in unspecified joint: M25.50

## 2011-09-07 HISTORY — PX: CYSTOSCOPY: SHX5120

## 2011-09-07 HISTORY — DX: Other chronic pain: G89.29

## 2011-09-07 HISTORY — DX: Gastro-esophageal reflux disease without esophagitis: K21.9

## 2011-09-07 HISTORY — DX: Other seasonal allergic rhinitis: J30.2

## 2011-09-07 HISTORY — DX: Other psychoactive substance abuse, in remission: F19.11

## 2011-09-07 HISTORY — DX: Unspecified asthma, uncomplicated: J45.909

## 2011-09-07 LAB — GLUCOSE, CAPILLARY: Glucose-Capillary: 109 mg/dL — ABNORMAL HIGH (ref 70–99)

## 2011-09-07 SURGERY — INSERTION, RADIATION SOURCE, PROSTATE
Anesthesia: General | Site: Prostate | Wound class: Clean Contaminated

## 2011-09-07 MED ORDER — FENTANYL CITRATE 0.05 MG/ML IJ SOLN
INTRAMUSCULAR | Status: DC | PRN
  Administered 2011-09-07 (×4): 25 ug via INTRAVENOUS
  Administered 2011-09-07: 100 ug via INTRAVENOUS
  Administered 2011-09-07 (×4): 25 ug via INTRAVENOUS

## 2011-09-07 MED ORDER — METOCLOPRAMIDE HCL 5 MG/ML IJ SOLN
INTRAMUSCULAR | Status: DC | PRN
  Administered 2011-09-07: 5 mg via INTRAVENOUS

## 2011-09-07 MED ORDER — IOHEXOL 350 MG/ML SOLN
INTRAVENOUS | Status: DC | PRN
  Administered 2011-09-07: 7 mL

## 2011-09-07 MED ORDER — MIDAZOLAM HCL 5 MG/5ML IJ SOLN
INTRAMUSCULAR | Status: DC | PRN
  Administered 2011-09-07: 2 mg via INTRAVENOUS

## 2011-09-07 MED ORDER — ONDANSETRON HCL 4 MG/2ML IJ SOLN
INTRAMUSCULAR | Status: DC | PRN
  Administered 2011-09-07: 4 mg via INTRAVENOUS

## 2011-09-07 MED ORDER — PROMETHAZINE HCL 25 MG/ML IJ SOLN: 6.2500 mg | INTRAMUSCULAR | Status: DC | PRN

## 2011-09-07 MED ORDER — CIPROFLOXACIN HCL 500 MG PO TABS: 500.0000 mg | ORAL_TABLET | Freq: Two times a day (BID) | ORAL | Status: AC

## 2011-09-07 MED ORDER — HYDROCODONE-ACETAMINOPHEN 10-325 MG PO TABS: 1.0000 | ORAL_TABLET | ORAL | Status: AC | PRN

## 2011-09-07 MED ORDER — PROPOFOL 10 MG/ML IV EMUL
INTRAVENOUS | Status: DC | PRN
  Administered 2011-09-07: 200 mg via INTRAVENOUS
  Administered 2011-09-07: 30 mg via INTRAVENOUS

## 2011-09-07 MED ORDER — STERILE WATER FOR IRRIGATION IR SOLN
Status: DC | PRN
  Administered 2011-09-07: 3000 mL

## 2011-09-07 MED ORDER — LACTATED RINGERS IV SOLN
INTRAVENOUS | Status: DC
  Administered 2011-09-07 (×2): via INTRAVENOUS

## 2011-09-07 MED ORDER — DEXAMETHASONE SODIUM PHOSPHATE 4 MG/ML IJ SOLN
INTRAMUSCULAR | Status: DC | PRN
  Administered 2011-09-07: 8 mg via INTRAVENOUS

## 2011-09-07 MED ORDER — FENTANYL CITRATE 0.05 MG/ML IJ SOLN: 25.0000 ug | INTRAMUSCULAR | Status: DC | PRN

## 2011-09-07 MED ORDER — LIDOCAINE HCL (CARDIAC) 20 MG/ML IV SOLN
INTRAVENOUS | Status: DC | PRN
  Administered 2011-09-07: 60 mg via INTRAVENOUS

## 2011-09-07 MED ORDER — FLEET ENEMA 7-19 GM/118ML RE ENEM: 1.0000 | ENEMA | Freq: Once | RECTAL | Status: DC

## 2011-09-07 MED ORDER — CIPROFLOXACIN IN D5W 400 MG/200ML IV SOLN
400.0000 mg | INTRAVENOUS | Status: AC
  Administered 2011-09-07: 400 mg via INTRAVENOUS

## 2011-09-07 SURGICAL SUPPLY — 25 items
BAG URINE DRAINAGE (UROLOGICAL SUPPLIES) ×3 IMPLANT
BLADE SURG ROTATE 9660 (MISCELLANEOUS) ×3 IMPLANT
CATH FOLEY 2WAY SLVR  5CC 16FR (CATHETERS) ×2
CATH FOLEY 2WAY SLVR 5CC 16FR (CATHETERS) ×4 IMPLANT
CATH ROBINSON RED A/P 20FR (CATHETERS) ×3 IMPLANT
CLOTH BEACON ORANGE TIMEOUT ST (SAFETY) ×3 IMPLANT
COVER MAYO STAND STRL (DRAPES) ×3 IMPLANT
COVER TABLE BACK 60X90 (DRAPES) ×3 IMPLANT
DRSG TEGADERM 4X4.75 (GAUZE/BANDAGES/DRESSINGS) ×3 IMPLANT
DRSG TEGADERM 8X12 (GAUZE/BANDAGES/DRESSINGS) ×3 IMPLANT
GLOVE BIO SURGEON STRL SZ7.5 (GLOVE) IMPLANT
GLOVE BIO SURGEON STRL SZ8 (GLOVE) ×6 IMPLANT
GLOVE ECLIPSE 8.0 STRL XLNG CF (GLOVE) ×5 IMPLANT
GOWN STRL REIN XL XLG (GOWN DISPOSABLE) ×2 IMPLANT
GOWN XL W/COTTON TOWEL STD (GOWNS) ×2 IMPLANT
HOLDER FOLEY CATH W/STRAP (MISCELLANEOUS) ×3 IMPLANT
IV NS IRRIG 3000ML ARTHROMATIC (IV SOLUTION) IMPLANT
IV WATER IRR. 1000ML (IV SOLUTION) ×2 IMPLANT
PACK CYSTOSCOPY (CUSTOM PROCEDURE TRAY) ×3 IMPLANT
SPONGE GAUZE 4X4 12PLY (GAUZE/BANDAGES/DRESSINGS) ×1 IMPLANT
SYRINGE 10CC LL (SYRINGE) ×3 IMPLANT
UNDERPAD 30X30 INCONTINENT (UNDERPADS AND DIAPERS) ×6 IMPLANT
WATER STERILE IRR 3000ML UROMA (IV SOLUTION) ×1 IMPLANT
WATER STERILE IRR 500ML POUR (IV SOLUTION) ×3 IMPLANT
nucletron selectseed ×1 IMPLANT

## 2011-09-07 NOTE — Op Note (Signed)
  Radiation Oncology         [phone] ________________________________  Name: Cole Conrad MRN: [id number]  Date: 06/20/2011  DOB: Sep 01, 1950       Prostate Seed Implant  CC:No primary provider on file.  No ref. 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  RADIATION DOSE: 145 Gy, definitive therapy.  TECHNIQUE: Cole Conrad was brought to the operating room with the urologist. He was placed in the dorsolithotomy position. He was catheterized and a rectal tube was inserted. The perineum was shaved, prepped and draped. The ultrasound probe was then introduced into the rectum to see the prostate gland.  TREATMENT DEVICE: A needle grid was attached to the ultrasound probe stand and anchor needles were placed.  COMPLEX ISODOSE CALCULATION: The prostate was imaged in 3D using a sagittal sweep of the prostate probe. These images were transferred to the planning computer. There, the prostate, urethra and rectum were defined on each axial reconstructed image. Then, the software created an optimized plan and a few seed positions were adjusted. Then the accepted plan was uploaded to the seed Selectron afterloading unit.  SPECIAL TREATMENT PROCEDURE/SUPERVISION AND HANDLING: The Nucletron FIRST system was used to place the needles under sagittal guidance. A total of 24 needles were used to deposit 84 seeds in the prostate gland. The individual seed activity was 49.056 mCi for a total implant activity of 0.584 mCi.  COMPLEX SIMULATION: At the end of the procedure, an anterior radiograph of the pelvis was obtained to document seed positioning and count. Cystoscopy was performed to check the urethra and bladder.  MICRODOSIMETRY: At the end of the procedure, the patient was emitting 0.48 mrem/hr at 1 meter. Accordingly, he was considered safe  for hospital discharge.  PLAN: The patient will return to the radiation oncology clinic for post implant CT dosimetry in three weeks.   ________________________________  Artist Pais Kathrynn Running, M.D.

## 2011-09-07 NOTE — Anesthesia Postprocedure Evaluation (Signed)
  Anesthesia Post-op Note  Patient: Cole Conrad  Procedure(s) Performed: Procedure(s) (LRB): RADIOACTIVE SEED IMPLANT (N/A) CYSTOSCOPY (N/A)  Patient Location: PACU  Anesthesia Type: General  Level of Consciousness: awake and alert   Airway and Oxygen Therapy: Patient Spontanous Breathing  Post-op Pain: mild  Post-op Assessment: Post-op Vital signs reviewed, Patient's Cardiovascular Status Stable, Respiratory Function Stable, Patent Airway and No signs of Nausea or vomiting  Post-op Vital Signs: stable  Complications: No apparent anesthesia complications

## 2011-09-07 NOTE — Transfer of Care (Signed)
Immediate Anesthesia Transfer of Care Note  Patient: Cole Conrad  Procedure(s) Performed: Procedure(s) (LRB): RADIOACTIVE SEED IMPLANT (N/A) CYSTOSCOPY (N/A)  Patient Location: PACU  Anesthesia Type: General  Level of Consciousness: drowsy  Airway & Oxygen Therapy: Patient Spontanous Breathing and Patient connected to face mask oxygen  Post-op Assessment: Report given to PACU RN and Post -op Vital signs reviewed and stable  Post vital signs: Reviewed and stable  Complications: No apparent anesthesia complications

## 2011-09-07 NOTE — Interval H&P Note (Signed)
History and Physical Interval Note:  09/07/2011 10:29 AM  Cole Conrad  has presented today for surgery, with the diagnosis of PROSTATE CANCER  The various methods of treatment have been discussed with the patient and family. After consideration of risks, benefits and other options for treatment, the patient has consented to  Procedure(s) (LRB): RADIOACTIVE SEED IMPLANT (N/A) as a surgical intervention .  The patients' history has been reviewed, patient examined, no change in status, stable for surgery.  I have reviewed the patients' chart and labs.  Questions were answered to the patient's satisfaction.     Garnett Farm

## 2011-09-07 NOTE — Anesthesia Procedure Notes (Signed)
Procedure Name: LMA Insertion Date/Time: 09/07/2011 11:27 AM Performed by: Norva Pavlov Pre-anesthesia Checklist: Patient identified, Emergency Drugs available, Suction available and Patient being monitored Patient Re-evaluated:Patient Re-evaluated prior to inductionOxygen Delivery Method: Circle System Utilized Preoxygenation: Pre-oxygenation with 100% oxygen Intubation Type: IV induction Ventilation: Mask ventilation without difficulty LMA: LMA inserted LMA Size: 5.0 Number of attempts: 1 Airway Equipment and Method: bite block Placement Confirmation: positive ETCO2 Tube secured with: Tape Dental Injury: Teeth and Oropharynx as per pre-operative assessment

## 2011-09-07 NOTE — Anesthesia Preprocedure Evaluation (Signed)
Anesthesia Evaluation  Patient identified by MRN, date of birth, ID band Patient awake    Reviewed: Allergy & Precautions, H&P , NPO status , Patient's Chart, lab work & pertinent test results  Airway Mallampati: II TM Distance: >3 FB Neck ROM: Full    Dental No notable dental hx.    Pulmonary asthma ,  breath sounds clear to auscultation  Pulmonary exam normal       Cardiovascular Exercise Tolerance: Good hypertension, Pt. on medications Rhythm:Regular Rate:Normal     Neuro/Psych PSYCHIATRIC DISORDERS negative neurological ROS     GI/Hepatic negative GI ROS, Neg liver ROS, GERD-  Medicated and Controlled,  Endo/Other  negative endocrine ROSDiabetes mellitus-, Type 2, Oral Hypoglycemic Agents  Renal/GU negative Renal ROS  negative genitourinary   Musculoskeletal negative musculoskeletal ROS (+)   Abdominal (+) + obese,   Peds negative pediatric ROS (+)  Hematology negative hematology ROS (+)   Anesthesia Other Findings   Reproductive/Obstetrics negative OB ROS                           Anesthesia Physical Anesthesia Plan  ASA: III  Anesthesia Plan: General   Post-op Pain Management:    Induction: Intravenous  Airway Management Planned: LMA  Additional Equipment:   Intra-op Plan:   Post-operative Plan: Extubation in OR  Informed Consent: I have reviewed the patients History and Physical, chart, labs and discussed the procedure including the risks, benefits and alternatives for the proposed anesthesia with the patient or authorized representative who has indicated his/her understanding and acceptance.   Dental advisory given  Plan Discussed with: CRNA  Anesthesia Plan Comments:         Anesthesia Quick Evaluation

## 2011-09-07 NOTE — Op Note (Signed)
PATIENT:  Cole Conrad  PRE-OPERATIVE DIAGNOSIS:  Adenocarcinoma of the prostate  POST-OPERATIVE DIAGNOSIS:  Same  PROCEDURE:  Procedure(s): 1. I-125 radioactive seed implantation 2. Cystoscopy  SURGEON:  Surgeon(s): Garnett Farm  Radiation oncologist: Dr. Margaretmary Dys  ANESTHESIA:  General  EBL:  Minimal  DRAINS: 16 French Foley catheter  INDICATION: Cole Conrad  Description of procedure: After informed consent the patient was brought to the major OR, placed on the table and administered general anesthesia. He was then moved to the modified lithotomy position with his perineum perpendicular to the floor. His perineum and genitalia were then sterilely prepped. An official timeout was then performed. A 16 French Foley catheter was then placed in the bladder and filled with dilute contrast, a rectal tube was placed in the rectum and the transrectal ultrasound probe was placed in the rectum and affixed to the stand. He was then sterilely draped.  Real time ultrasonography was used along with the seed planning software spot-pro version 3.1-00. This was used to develop the seed plan including the number of needles as well as number of seeds required for complete and adequate coverage. Real-time ultrasonography was then used along with the previously developed plan and the Nucletron device to implant a total of 84 seeds using 24 needles. This proceeded without difficulty or complication.  A Foley catheter was then removed as well as the transrectal ultrasound probe and rectal probe. Flexible cystoscopy was then performed using the 17 French flexible scope which revealed a normal urethra throughout its length down to the sphincter which appeared intact. The prostatic urethra revealed bilobar hypertrophy but no evidence of obstruction, seeds, spacers or lesions. The bladder was then entered and fully and systematically inspected. The ureteral orifices were noted to be of normal  configuration and position. The mucosa revealed no evidence of tumors. There were also no stones identified within the bladder. I noted no seeds or spacers on the floor of the bladder and retroflexion of the scope revealed no seeds protruding from the base of the prostate.  The cystoscope was then removed and a new 16 French Foley catheter was then inserted and the balloon was filled with 10 cc of sterile water. This was connected to closed system drainage and the patient was awakened and taken to recovery room in stable and satisfactory condition. He tolerated procedure well and there were no intraoperative complications.

## 2011-09-07 NOTE — Discharge Instructions (Signed)
DISCHARGE INSTRUCTIONS FOR PROSTATE SEED IMPLANTATION  Removal of catheter Remove the foley catheter after 24 hours ( day after the procedure).can be done easily by cutting the side port of the catheter, whichallow the balloon to deflate.  You will see 1-2 teaspoons of clear water as the balloon deflates and then the catheter can be slid out without difficulty.        Cut here  Antibiotics You may be given a prescription for an antibiotic to take when you arrive home. If so, be sure to take every tablet in the bottle, even if you are feeling better before the prescription is finished. If you begin itching, notice a rash or start to swell on your trunk, arms, legs and/or throat, immediately stop taking the antibiotic and call your Urologist. Diet Resume your usual diet when you return home. To keep your bowels moving easily and softly, drink prune, apple and cranberry juice at room temperature. You may also take a stool softener, such as Colace, which is available without prescription at local pharmacies. Daily activities   No driving or heavy lifting for at least two days after the implant.   No bike riding, horseback riding or riding lawn mowers for the first month after the implant.   Any strenuous physical activity should be approved by your doctor before you resume it. Sexual relations You may resume sexual relations two weeks after the procedure. A condom should be used for the first two weeks. Your semen may be dark brown or black; this is normal and is related bleeding that may have occurred during the implant. Postoperative swelling Expect swelling and bruising of the scrotum and perineum (the area between the scrotum and anus). Both the swelling and the bruising should resolve in l or 2 weeks. Ice packs and over- the-counter medications such as Tylenol, Advil or Aleve may lessen your discomfort. Postoperative urination Most men experience burning on urination and/or urinary frequency.  If this becomes bothersome, contact your Urologist.  Medication can be prescribed to relieve these problems.  It is normal to have some blood in your urine for a few days after the implant. Special instructions related to the seeds It is unlikely that you will pass an Iodine-125 seed in your urine. The seeds are silver in color and are about as large as a grain of rice. If you pass a seed, do not handle it with your fingers. Use a spoon to place it in an envelope or jar in place this in base occluded area such as the garage or basement for return to the radiation clinic at your convenience.  Contact your doctor for   Temperature greater than 101 F   Increasing pain   Inability to urinate Follow-up  You should have follow up with your urologist and radiation oncologist about 3 weeks after the procedure. General information regarding Iodine seeds   Iodine-125 is a low energy radioactive material. It is not deeply penetrating and loses energy at short distances. Your prostate will absorb the radiation. Objects that are touched or used by the patient do not become radioactive.   Body wastes (urine and stool) or body fluids (saliva, tears, semen or blood) are not radioactive.   The Nuclear Regulatory Commission [facility]) has determined that no radiation precautions are needed for patients undergoing Iodine-125 seed implantation. The [facility] states that such patients do not present a risk to the people around them, including young children and pregnant women. However, in keeping with the general principle  that radiation exposure should be kept as low reasonably possible, we suggest the following:   Children and pets should not sit on the patient's lap for the first two (2) weeks after the implant.   Pregnant (or possibly pregnant) women should avoid prolonged, close contact with the patient for the first two (2) weeks after the implant.   A distance of three (3) feet is acceptable. At a distance of three (3)  feet, there is no limit to the length of time anyone can be with the patientRadioactive Seed Implant Home Care Instructions   Activity:    Rest for the remainder of the day.  Do not drive or            operate equipment today.  You may resume normal     activities in a few days as instructed by your      physician, without risk of harmful radiation       exposure to those around you, provided you follow     the time and distance precautions on the Radiation     Oncology Instruction Sheet.   Meals: Drink plenty of lipuids and eat light foods, such as     gelatin or soup this evening .  You may return to normal meal    plan tomorrow.  Return To Work: You may return to work as instructed by Designer, multimedia.  Special Instruction: Remove foley catheter ***.  To remove foley, cut      short end of  "Y" on foley catheter.  Drain water.  After the    water has drained, pull catheter from bladder.   If any seeds are    found, use tweezers to pick up seeds and place in a glass    container of any kind and bring to your physician's office.  Call your physician if any of these symptoms occur:  Persistent or heavy bleeding Urine stream diminishes or stops completely after catheter is removed Fever equal to or greater than 101 degrees F Cloudy urine with a strong foul odor Severe pain  You may feel some burning pain and/or hesitancy when you urinate after the catheter is removed.  These symptoms may increase over the next few weeks, but should diminish within forur to six weeks.  Applying moist heat to the lower abdomen or a hot tub bath may help relieve the pain.  If the discomfort becomes severe, please call your physician for additional medications.  Follow-up (Date of Return Visit to Physician): ***  Patient:_______________________________   @date @  Nurse:________________________________ @date @   Post Anesthesia Home Care Instructions  Activity: Get plenty of rest for the remainder of the day.  A responsible adult should stay with you for 24 hours following the procedure.  For the next 24 hours, DO NOT: -Drive a car -Advertising copywriter -Drink alcoholic beverages -Take any medication unless instructed by your physician -Make any legal decisions or sign important papers.  Meals: Start with liquid foods such as gelatin or soup. Progress to regular foods as tolerated. Avoid greasy, spicy, heavy foods. If nausea and/or vomiting occur, drink only clear liquids until the nausea and/or vomiting subsides. Call your physician if vomiting continues.  Special Instructions/Symptoms: Your throat may feel dry or sore from the anesthesia or the breathing tube placed in your throat during surgery. If this causes discomfort, gargle with warm salt water. The discomfort should disappear within 24 hours.   Post Anesthesia Home Care Instructions  Activity: Get plenty of  rest for the remainder of the day. A responsible adult should stay with you for 24 hours following the procedure.  For the next 24 hours, DO NOT: -Drive a car -Advertising copywriter -Drink alcoholic beverages -Take any medication unless instructed by your physician -Make any legal decisions or sign important papers.  Meals: Start with liquid foods such as gelatin or soup. Progress to regular foods as tolerated. Avoid greasy, spicy, heavy foods. If nausea and/or vomiting occur, drink only clear liquids until the nausea and/or vomiting subsides. Call your physician if vomiting continues.  Special Instructions/Symptoms: Your throat may feel dry or sore from the anesthesia or the breathing tube placed in your throat during surgery. If this causes discomfort, gargle with warm salt water. The discomfort should disappear within 24 hours.    Marland Kitchen

## 2011-09-11 ENCOUNTER — Encounter (HOSPITAL_BASED_OUTPATIENT_CLINIC_OR_DEPARTMENT_OTHER): Payer: Self-pay | Admitting: Urology

## 2011-09-11 LAB — GLUCOSE, CAPILLARY: Glucose-Capillary: 132 mg/dL — ABNORMAL HIGH (ref 70–99)

## 2011-09-11 NOTE — Progress Notes (Signed)
Had blood with BM.   Had taken stool softener and the movement was very large.  Instructed to get off of feet and increase fluid and touch base with surgeon if noted more blood.  Patient stated the blood went away after the BM.

## 2011-09-26 ENCOUNTER — Telehealth: Payer: Self-pay | Admitting: *Deleted

## 2011-09-26 NOTE — Telephone Encounter (Signed)
CALLED PATIENT TO REMIND OF APPTS. FOR 09-27-11, LVM FOR A RETURN CALL

## 2011-09-27 ENCOUNTER — Ambulatory Visit
Admission: RE | Admit: 2011-09-27 | Discharge: 2011-09-27 | Disposition: A | Discharge status: Home / Self Care | Payer: Medicare Other | Source: Ambulatory Visit | Attending: Radiation Oncology | Admitting: Radiation Oncology

## 2011-09-27 ENCOUNTER — Encounter: Payer: Self-pay | Admitting: Radiation Oncology

## 2011-09-27 VITALS — BP 130/88 | HR 80 | Temp 98.2°F | Wt 199.7 lb

## 2011-09-27 DIAGNOSIS — Z8042 Family history of malignant neoplasm of prostate: Secondary | ICD-10-CM | POA: Insufficient documentation

## 2011-09-27 DIAGNOSIS — Z79899 Other long term (current) drug therapy: Secondary | ICD-10-CM | POA: Insufficient documentation

## 2011-09-27 DIAGNOSIS — E119 Type 2 diabetes mellitus without complications: Secondary | ICD-10-CM | POA: Insufficient documentation

## 2011-09-27 DIAGNOSIS — I10 Essential (primary) hypertension: Secondary | ICD-10-CM | POA: Insufficient documentation

## 2011-09-27 DIAGNOSIS — C61 Malignant neoplasm of prostate: Secondary | ICD-10-CM

## 2011-09-27 DIAGNOSIS — Z7982 Long term (current) use of aspirin: Secondary | ICD-10-CM | POA: Insufficient documentation

## 2011-09-27 NOTE — Progress Notes (Signed)
Radiation Oncology         ([phone]) [phone] ________________________________  Name: Cole Conrad MRN: [id number]  Date: 09/27/2011  DOB: Nov 18, 1950  Follow-Up Visit Note  CC: No primary provider on file.  Garnett Farm, MD  Diagnosis:   61 year old gentleman with stage TI C. adenocarcinoma prostate with Gleason score 3+3 PSA of 7.4  Interval Since Last Radiation:  1 months  Narrative:  The patient returns today for routine follow-up.  He is complaining of increased urinary frequency and urinary hesitation symptoms. He filled out a questionnaire regarding urinary function today providing and overall IPSS score of 13 characterizing his symptoms as moderate.  His pre-implant score was 16. He denies any bowel symptoms except difficulty distinguishing between a full bladder and full rectum.  ALLERGIES:   has no known allergies.  Meds: Current Outpatient Prescriptions  Medication Sig Dispense Refill  . aspirin 81 MG tablet Take 81 mg by mouth daily.      . cyclobenzaprine (FLEXERIL) 10 MG tablet Take 10 mg by mouth 3 (three) times daily.      Marland Kitchen glimepiride (AMARYL) 4 MG tablet Take 4 mg by mouth daily before breakfast.      . lisinopril (PRINIVIL,ZESTRIL) 5 MG tablet Take 5 mg by mouth daily.       . Multiple Vitamins-Minerals (MULTIVITAMIN PO) Take 1 tablet by mouth daily.      . naproxen sodium (ANAPROX) 220 MG tablet Take 220 mg by mouth as needed.      Marland Kitchen omeprazole (PRILOSEC) 20 MG capsule Take 20 mg by mouth every morning.      . traMADol (ULTRAM) 50 MG tablet Take 50 mg by mouth every 6 (six) hours as needed.      . metformin (FORTAMET) 1000 MG (OSM) 24 hr tablet Take 1,000 mg by mouth 2 (two) times daily with a meal.         Physical Findings: The patient is in no acute distress. Patient is alert and oriented.  weight is 199 lb 11.2 oz (90.583 kg). His temperature is 98.2 F (36.8 C). His blood pressure is 130/88 and his pulse is 80. .  No significant changes.  Lab  Findings: Lab Results  Component Value Date   WBC 7.9 08/31/2011   HGB 13.7 08/31/2011   HCT 39.5 08/31/2011   MCV 88.2 08/31/2011   PLT 266 08/31/2011    Radiographic Findings:  Patient underwent CT imaging in our clinic for post implant dosimetry. The CT appears to demonstrate an adequate distribution of radioactive seeds throughout the prostate gland. There no seeds in her near the rectum. I suspect the final radiation plan and dosimetry will show appropriate coverage of the prostate gland.   Impression: The patient is recovering from the effects of radiation. His urinary symptoms should gradually improve over the next 4-6 months. We talked about this today. He is encouraged by his improvement already and is otherwise please with his outcome.   Plan: Today, I spent time talking to the patient about his prostate seed implant and resolving urinary symptoms. Which for long-term followup for prostate cancer following seed implant. He understands that ongoing PSA determinations and digital rectal exams will help perform surveillance to rule out disease recurrence. He understands what to expect with his PSA measures. Patient was also educated today about some of the long-term effects would radiation including the Small risk for rectal bleeding and possibly erectile dysfunction. Talked about some of the general management approaches to these potential complications.  However, I did encourage the patient to contact her office or return at any point if he has questions or concerns related to his previous radiation and prostate cancer.   _____________________________________  Artist Pais. Kathrynn Running, M.D.

## 2011-09-27 NOTE — Progress Notes (Signed)
Patient here for routine follow up post post seed implant. Denies pain on urination just has feeling of heaviness.IPSS score 13 Continued back pain radiating down right leg. Needs to get refill for flexeril.

## 2011-09-27 NOTE — Progress Notes (Signed)
  Radiation Oncology         [phone]) [phone] ________________________________  Name: Cole Conrad MRN: [id number]  Date: 09/27/2011  DOB: August 03, 1950  COMPLEX SIMULATION NOTE  NARRATIVE:  The patient was brought to the CT Simulation planning suite today following prostate seed implantation approximately one month ago.  Identity was confirmed.  All relevant records and images related to the planned course of therapy were reviewed.  Then, the patient was set-up supine.  CT images were obtained.  The CT images were loaded into the planning software.  Then the prostate and rectum were contoured.  Treatment planning then occurred.  The implanted iodine 125 seeds were identified by the physics staff for projection of radiation distribution  I have requested : 3D Simulation  I have requested a DVH of the following structures: Prostate and rectum.    ________________________________  Artist Pais Kathrynn Running, M.D.

## 2011-10-22 ENCOUNTER — Encounter: Payer: Self-pay | Admitting: Radiation Oncology

## 2011-10-28 NOTE — Progress Notes (Signed)
  Radiation Oncology         ([phone]) [phone] ________________________________  Name: Demarco Bacci MRN: [id number]  Date: 10/22/2011  DOB: 01/25/1951  3-D Planning Note Prostate Brachytherapy  CC:  Dr. Vernie Ammons  Diagnosis: 61 year old gentleman with stage T1c adenocarcinoma of the prostate with a Gleason's score of 3+3 and PSA of 7.4  Narrative: Dwyane Dee returned following prostate seed implantation for post implant planning. He underwent CT scan to delineate the three-dimensional structures of the pelvis and demonstrate the radiation distribution.  Results:   Prostate Coverage - The dose of radiation delivered to the 90% or more of the prostate gland (D90) was 117.77% of the prescription dose. This exceeds our goal of greater than 90%. Rectal Sparing - The volume of rectal tissue receiving the prescription dose or higher was 0.18 cc. This falls under our thresholds tolerance of 1.0 cc.  Impression: The prostate seed implant appears to show adequate target coverage and appropriate rectal sparing.  Plan:  The patient will continue to follow with urology for ongoing PSA determinations. I would anticipate a high likelihood for local tumor control with minimal risk for rectal morbidity.   Artist Pais Kathrynn Running, M.D.

## 2011-11-11 ENCOUNTER — Inpatient Hospital Stay (HOSPITAL_COMMUNITY)
Admission: EM | Admit: 2011-11-11 | Discharge: 2011-11-19 | DRG: 682 | Disposition: A | Discharge status: Home Health | Payer: Medicare Other | Attending: Family Medicine | Admitting: Family Medicine

## 2011-11-11 ENCOUNTER — Emergency Department (HOSPITAL_COMMUNITY): Payer: Medicare Other

## 2011-11-11 ENCOUNTER — Encounter (HOSPITAL_COMMUNITY): Payer: Self-pay | Admitting: *Deleted

## 2011-11-11 DIAGNOSIS — K560 Paralytic ileus: Secondary | ICD-10-CM | POA: Diagnosis present

## 2011-11-11 DIAGNOSIS — Z87448 Personal history of other diseases of urinary system: Secondary | ICD-10-CM

## 2011-11-11 DIAGNOSIS — M1710 Unilateral primary osteoarthritis, unspecified knee: Secondary | ICD-10-CM

## 2011-11-11 DIAGNOSIS — A419 Sepsis, unspecified organism: Secondary | ICD-10-CM | POA: Diagnosis present

## 2011-11-11 DIAGNOSIS — E1169 Type 2 diabetes mellitus with other specified complication: Secondary | ICD-10-CM | POA: Diagnosis present

## 2011-11-11 DIAGNOSIS — N179 Acute kidney failure, unspecified: Principal | ICD-10-CM | POA: Diagnosis present

## 2011-11-11 DIAGNOSIS — J45909 Unspecified asthma, uncomplicated: Secondary | ICD-10-CM | POA: Diagnosis present

## 2011-11-11 DIAGNOSIS — M1610 Unilateral primary osteoarthritis, unspecified hip: Secondary | ICD-10-CM | POA: Diagnosis present

## 2011-11-11 DIAGNOSIS — R339 Retention of urine, unspecified: Secondary | ICD-10-CM | POA: Diagnosis present

## 2011-11-11 DIAGNOSIS — D649 Anemia, unspecified: Secondary | ICD-10-CM | POA: Diagnosis present

## 2011-11-11 DIAGNOSIS — IMO0001 Reserved for inherently not codable concepts without codable children: Secondary | ICD-10-CM | POA: Diagnosis present

## 2011-11-11 DIAGNOSIS — I10 Essential (primary) hypertension: Secondary | ICD-10-CM | POA: Diagnosis present

## 2011-11-11 DIAGNOSIS — M169 Osteoarthritis of hip, unspecified: Secondary | ICD-10-CM | POA: Diagnosis present

## 2011-11-11 DIAGNOSIS — E162 Hypoglycemia, unspecified: Secondary | ICD-10-CM

## 2011-11-11 DIAGNOSIS — T50905A Adverse effect of unspecified drugs, medicaments and biological substances, initial encounter: Secondary | ICD-10-CM

## 2011-11-11 DIAGNOSIS — E875 Hyperkalemia: Secondary | ICD-10-CM | POA: Diagnosis present

## 2011-11-11 DIAGNOSIS — E871 Hypo-osmolality and hyponatremia: Secondary | ICD-10-CM | POA: Diagnosis present

## 2011-11-11 DIAGNOSIS — K219 Gastro-esophageal reflux disease without esophagitis: Secondary | ICD-10-CM | POA: Diagnosis present

## 2011-11-11 DIAGNOSIS — Z79899 Other long term (current) drug therapy: Secondary | ICD-10-CM

## 2011-11-11 DIAGNOSIS — M51379 Other intervertebral disc degeneration, lumbosacral region without mention of lumbar back pain or lower extremity pain: Secondary | ICD-10-CM | POA: Diagnosis present

## 2011-11-11 DIAGNOSIS — C61 Malignant neoplasm of prostate: Secondary | ICD-10-CM | POA: Diagnosis present

## 2011-11-11 DIAGNOSIS — Z7982 Long term (current) use of aspirin: Secondary | ICD-10-CM

## 2011-11-11 DIAGNOSIS — K567 Ileus, unspecified: Secondary | ICD-10-CM | POA: Diagnosis not present

## 2011-11-11 DIAGNOSIS — N400 Enlarged prostate without lower urinary tract symptoms: Secondary | ICD-10-CM

## 2011-11-11 DIAGNOSIS — R509 Fever, unspecified: Secondary | ICD-10-CM | POA: Diagnosis present

## 2011-11-11 DIAGNOSIS — M5137 Other intervertebral disc degeneration, lumbosacral region: Secondary | ICD-10-CM | POA: Diagnosis present

## 2011-11-11 DIAGNOSIS — M1990 Unspecified osteoarthritis, unspecified site: Secondary | ICD-10-CM

## 2011-11-11 DIAGNOSIS — R21 Rash and other nonspecific skin eruption: Secondary | ICD-10-CM | POA: Diagnosis present

## 2011-11-11 DIAGNOSIS — E11649 Type 2 diabetes mellitus with hypoglycemia without coma: Secondary | ICD-10-CM

## 2011-11-11 LAB — HEPATIC FUNCTION PANEL
ALT: 40 U/L (ref 0–53)
AST: 23 U/L (ref 0–37)
Albumin: 3 g/dL — ABNORMAL LOW (ref 3.5–5.2)
Alkaline Phosphatase: 69 U/L (ref 39–117)
Total Protein: 7.1 g/dL (ref 6.0–8.3)

## 2011-11-11 LAB — BASIC METABOLIC PANEL
BUN: 75 mg/dL — ABNORMAL HIGH (ref 6–23)
BUN: 76 mg/dL — ABNORMAL HIGH (ref 6–23)
CO2: 14 mEq/L — ABNORMAL LOW (ref 19–32)
Calcium: 8.8 mg/dL (ref 8.4–10.5)
Creatinine, Ser: 10.34 mg/dL — ABNORMAL HIGH (ref 0.50–1.35)
GFR calc Af Amer: 6 mL/min — ABNORMAL LOW (ref >90)
GFR calc non Af Amer: 5 mL/min — ABNORMAL LOW (ref >90)
GFR calc non Af Amer: 5 mL/min — ABNORMAL LOW (ref >90)
Glucose, Bld: 23 mg/dL — CL (ref 70–99)
Glucose, Bld: 33 mg/dL — CL (ref 70–99)
Potassium: 6.4 mEq/L (ref 3.5–5.1)
Potassium: 7 mEq/L (ref 3.5–5.1)

## 2011-11-11 LAB — PROTIME-INR: INR: 1.04 (ref 0.00–1.49)

## 2011-11-11 LAB — URINALYSIS, ROUTINE W REFLEX MICROSCOPIC
Bilirubin Urine: NEGATIVE
Glucose, UA: NEGATIVE mg/dL
Hgb urine dipstick: NEGATIVE
Ketones, ur: NEGATIVE mg/dL
Nitrite: NEGATIVE
Specific Gravity, Urine: 1.021 (ref 1.005–1.030)
pH: 5.5 (ref 5.0–8.0)

## 2011-11-11 LAB — CBC WITH DIFFERENTIAL/PLATELET
Basophils Relative: 0 % (ref 0–1)
Eosinophils Relative: 1 % (ref 0–5)
HCT: 35.1 % — ABNORMAL LOW (ref 39.0–52.0)
Hemoglobin: 12.1 g/dL — ABNORMAL LOW (ref 13.0–17.0)
Lymphocytes Relative: 10 % — ABNORMAL LOW (ref 12–46)
MCH: 29.7 pg (ref 26.0–34.0)
MCHC: 34.5 g/dL (ref 30.0–36.0)
Neutro Abs: 5.7 10*3/uL (ref 1.7–7.7)
Neutrophils Relative %: 80 % — ABNORMAL HIGH (ref 43–77)
RBC: 4.07 MIL/uL — ABNORMAL LOW (ref 4.22–5.81)

## 2011-11-11 LAB — PROCALCITONIN: Procalcitonin: 1.53 ng/mL

## 2011-11-11 LAB — GLUCOSE, CAPILLARY: Glucose-Capillary: 38 mg/dL — CL (ref 70–99)

## 2011-11-11 MED ORDER — SODIUM BICARBONATE 8.4 % IV SOLN
INTRAVENOUS | Status: DC
  Administered 2011-11-12 – 2011-11-13 (×3): via INTRAVENOUS
  Filled 2011-11-11 (×5): qty 150

## 2011-11-11 MED ORDER — DEXTROSE 50 % IV SOLN
INTRAVENOUS | Status: AC
  Administered 2011-11-11: 50 mL via INTRAVENOUS
  Filled 2011-11-11: qty 50

## 2011-11-11 MED ORDER — OCTREOTIDE ACETATE 100 MCG/ML IJ SOLN
100.0000 ug | Freq: Once | INTRAMUSCULAR | Status: DC
  Filled 2011-11-11: qty 1

## 2011-11-11 MED ORDER — SODIUM BICARBONATE 8.4 % IV SOLN
100.0000 meq | Freq: Once | INTRAVENOUS | Status: AC
  Administered 2011-11-11: 100 meq via INTRAVENOUS
  Filled 2011-11-11 (×2): qty 50

## 2011-11-11 MED ORDER — HYDROCODONE-ACETAMINOPHEN 10-325 MG PO TABS
1.0000 | ORAL_TABLET | Freq: Four times a day (QID) | ORAL | Status: DC | PRN
  Administered 2011-11-12 – 2011-11-18 (×6): 1 via ORAL
  Filled 2011-11-11 (×6): qty 1

## 2011-11-11 MED ORDER — DEXTROSE 50 % IV SOLN
INTRAVENOUS | Status: AC
  Administered 2011-11-11: 1
  Filled 2011-11-11: qty 50

## 2011-11-11 MED ORDER — ONDANSETRON HCL 4 MG/2ML IJ SOLN: 4.0000 mg | Freq: Three times a day (TID) | INTRAMUSCULAR | Status: AC | PRN

## 2011-11-11 MED ORDER — ACETAMINOPHEN 650 MG RE SUPP: 650.0000 mg | Freq: Four times a day (QID) | RECTAL | Status: DC | PRN

## 2011-11-11 MED ORDER — DEXTROSE 50 % IV SOLN
1.0000 | Freq: Once | INTRAVENOUS | Status: AC
  Administered 2011-11-11 – 2011-11-12 (×5): 50 mL via INTRAVENOUS

## 2011-11-11 MED ORDER — SODIUM BICARBONATE 650 MG PO TABS
650.0000 mg | ORAL_TABLET | Freq: Two times a day (BID) | ORAL | Status: DC
  Administered 2011-11-11 – 2011-11-14 (×6): 650 mg via ORAL
  Filled 2011-11-11 (×8): qty 1

## 2011-11-11 MED ORDER — ACETAMINOPHEN 325 MG PO TABS
650.0000 mg | ORAL_TABLET | Freq: Four times a day (QID) | ORAL | Status: DC | PRN
  Administered 2011-11-12 – 2011-11-16 (×6): 650 mg via ORAL
  Filled 2011-11-11 (×6): qty 2

## 2011-11-11 MED ORDER — SODIUM CHLORIDE 0.9 % IJ SOLN
3.0000 mL | Freq: Two times a day (BID) | INTRAMUSCULAR | Status: DC
  Administered 2011-11-12: 3 mL via INTRAVENOUS
  Administered 2011-11-12: 10 mL via INTRAVENOUS
  Administered 2011-11-12 – 2011-11-18 (×9): 3 mL via INTRAVENOUS

## 2011-11-11 MED ORDER — DEXTROSE-NACL 5-0.9 % IV SOLN
INTRAVENOUS | Status: DC
  Administered 2011-11-11: 22:00:00 via INTRAVENOUS

## 2011-11-11 MED ORDER — DEXTROSE 50 % IV SOLN
INTRAVENOUS | Status: AC
  Administered 2011-11-11: 50 mL
  Filled 2011-11-11: qty 50

## 2011-11-11 MED ORDER — CYCLOBENZAPRINE HCL 10 MG PO TABS
10.0000 mg | ORAL_TABLET | Freq: Three times a day (TID) | ORAL | Status: DC
  Administered 2011-11-11 – 2011-11-19 (×22): 10 mg via ORAL
  Filled 2011-11-11 (×27): qty 1

## 2011-11-11 MED ORDER — SODIUM CHLORIDE 0.9 % IV SOLN: INTRAVENOUS | Status: DC

## 2011-11-11 MED ORDER — DEXTROSE 10 % IV SOLN
INTRAVENOUS | Status: DC
  Administered 2011-11-12: 05:00:00 via INTRAVENOUS
  Administered 2011-11-12: 100 mL via INTRAVENOUS
  Administered 2011-11-12: via INTRAVENOUS
  Administered 2011-11-12: 100 mL via INTRAVENOUS

## 2011-11-11 MED ORDER — PANTOPRAZOLE SODIUM 40 MG PO TBEC
40.0000 mg | DELAYED_RELEASE_TABLET | Freq: Every day | ORAL | Status: DC
  Filled 2011-11-11: qty 1

## 2011-11-11 MED ORDER — SODIUM POLYSTYRENE SULFONATE 15 GM/60ML PO SUSP
30.0000 g | Freq: Once | ORAL | Status: AC
  Administered 2011-11-11: 30 g via ORAL
  Filled 2011-11-11 (×2): qty 60

## 2011-11-11 MED ORDER — SODIUM CHLORIDE 0.9 % IV BOLUS (SEPSIS)
500.0000 mL | Freq: Once | INTRAVENOUS | Status: AC
  Administered 2011-11-11: 500 mL via INTRAVENOUS

## 2011-11-11 MED ORDER — ALUM & MAG HYDROXIDE-SIMETH 200-200-20 MG/5ML PO SUSP: 30.0000 mL | Freq: Four times a day (QID) | ORAL | Status: DC | PRN

## 2011-11-11 MED ORDER — KCL IN DEXTROSE-NACL 20-5-0.45 MEQ/L-%-% IV SOLN
Freq: Once | INTRAVENOUS | Status: AC
  Administered 2011-11-11: 20:00:00 via INTRAVENOUS
  Filled 2011-11-11: qty 1000

## 2011-11-11 MED ORDER — ENOXAPARIN SODIUM 40 MG/0.4ML ~~LOC~~ SOLN
40.0000 mg | Freq: Every day | SUBCUTANEOUS | Status: DC
  Administered 2011-11-11: 40 mg via SUBCUTANEOUS
  Filled 2011-11-11 (×2): qty 0.4

## 2011-11-11 MED ORDER — LIDOCAINE HCL 2 % EX GEL
CUTANEOUS | Status: AC
  Administered 2011-11-11: 10
  Filled 2011-11-11: qty 10

## 2011-11-11 MED ORDER — PIPERACILLIN-TAZOBACTAM IN DEX 2-0.25 GM/50ML IV SOLN
2.2500 g | Freq: Three times a day (TID) | INTRAVENOUS | Status: DC
  Administered 2011-11-11 – 2011-11-12 (×2): 2.25 g via INTRAVENOUS
  Filled 2011-11-11 (×4): qty 50

## 2011-11-11 MED ORDER — ASPIRIN EC 81 MG PO TBEC
81.0000 mg | DELAYED_RELEASE_TABLET | Freq: Every day | ORAL | Status: DC
  Administered 2011-11-12 – 2011-11-19 (×8): 81 mg via ORAL
  Filled 2011-11-11 (×9): qty 1

## 2011-11-11 NOTE — ED Notes (Signed)
Pts CBG continues to drop, last reading 28, addt amp of Dextrose given, Dr. Lavera Guise notified. Pt A & O, no deficits noted. 2nd IV est, addt labs drawn. Meds given per MD order, EKG done, foley cath placed. Family at bedside.

## 2011-11-11 NOTE — ED Provider Notes (Signed)
History     CSN: [id number]  Arrival date & time 11/11/11  4098   First MD Initiated Contact with Patient 11/11/11 1958      Chief Complaint  Patient presents with  . Hypoglycemia    (Consider location/radiation/quality/duration/timing/severity/associated sxs/prior treatment) HPI Comments: Pt comes in with cc of Altered mental status. Pt has hx of NIDDM, prostate CA with a new stimulator not on chemo or radiation. Family reports, that patient has been a little off today. His sugar in the morning was slightly low, and they called the diabetic clinic and the instructions given were to feed him well and check his blood sugar periodically. Pt later in the day, started being a little confused, and slurred speech - and so he was rushed to the ER. OT at arrival was in the 30s, and patient was given some d50 and his mentation improved spontaneously.  Pt denies any n/v/diarrhea/abd pain. He has no fevers, chills. He reports no increased meds taken. He has no hx of similar complains in the recent past, and no meds changes have been made recently. Pt does indicate that his speech is lightly slurred, but he has no other neurologic complains and the face appears symmetric to the family. Pt has no dysphagia.  The history is provided by the patient, a relative and medical records.    Past Medical History  Diagnosis Date  . ED (erectile dysfunction)   . Mild asthma SEASONAL -  PT HAS NO INHALER  . Diabetes mellitus ORAL MEDS    TYPE II  . Prostate cancer 05/01/2011 BX    ADENOCARCINOMA,gLEASON= 3+3=6,VOLUNE=68.8CC ,PSA=7.40  . Arthritis LUMBAR BACK, SHOULDER, LEFT KNEE, RIGHT WRIST  . DJD (degenerative joint disease)   . Chronic joint pain   . GERD (gastroesophageal reflux disease)   . Seasonal allergies   . 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  1997 (15 YRS AGO)    STATES NO DRUG USE SINCE  . History of head injury 2010-  CLOSED  W/ CONCUSSION--    NO RESIDUAL  . Eczema     Past Surgical History  Procedure Date  . Rotator cuff repair 01-10-2004    RIGHT  . Radioactive seed implant 09/07/2011    Procedure: RADIOACTIVE SEED IMPLANT;  Surgeon: Garnett Farm, MD;  Location: [facility];  Service: Urology;  Laterality: N/A;  84  seeds implanted  . Cystoscopy 09/07/2011    Procedure: CYSTOSCOPY;  Surgeon: Garnett Farm, MD;  Location: [facility];  Service: Urology;  Laterality: N/A;  no seeds noted in bladder    Family History  Problem Relation Age of Onset  . Prostate cancer Maternal Uncle     History  Substance Use Topics  . Smoking status: Former Smoker    Types: Cigarettes    Quit date: 09/02/1973  . Smokeless tobacco: Never Used   Comment: QUIT SMOKING 1970'S  . Alcohol Use: No      Review of Systems  Constitutional: Positive for activity change. Negative for fever, chills and appetite change.  HENT: Negative for neck pain.   Eyes: Negative for visual disturbance.  Respiratory: Negative for cough, chest tightness and shortness of breath.   Cardiovascular: Negative for chest pain.  Gastrointestinal: Negative for abdominal pain and abdominal distention.  Genitourinary: Negative for dysuria, enuresis and difficulty urinating.  Musculoskeletal: Negative for arthralgias.  Neurological: Positive for dizziness, tremors, speech difficulty and weakness. Negative for syncope, light-headedness  and headaches.  Psychiatric/Behavioral: Negative for confusion.    Allergies  Review of patient's allergies indicates no known allergies.  Home Medications   Current Outpatient Rx  Name Route Sig Dispense Refill  . ASPIRIN 81 MG PO TABS Oral Take 81 mg by mouth daily.    Marland Kitchen CETIRIZINE HCL 10 MG PO TABS Oral Take 10 mg by mouth daily.    . CYCLOBENZAPRINE HCL 10 MG PO TABS Oral Take 10 mg by mouth 3 (three) times daily.    Marland Kitchen DOCUSATE SODIUM 100 MG PO CAPS Oral Take 100 mg by mouth 2 (two) times  daily.    Marland Kitchen GLIMEPIRIDE 4 MG PO TABS Oral Take 4 mg by mouth daily before breakfast.    . HYDROCODONE-ACETAMINOPHEN 10-325 MG PO TABS Oral Take 1-2 tablets by mouth every 6 (six) hours as needed. Pain    . LISINOPRIL 5 MG PO TABS Oral Take 5 mg by mouth daily.     Marland Kitchen METFORMIN HCL ER (OSM) 1000 MG PO TB24 Oral Take 1,000 mg by mouth 2 (two) times daily with a meal.     . MULTIVITAMIN PO Oral Take 1 tablet by mouth daily.    Marland Kitchen NAPROXEN SODIUM 220 MG PO TABS Oral Take 220 mg by mouth as needed.    Marland Kitchen OMEPRAZOLE 20 MG PO CPDR Oral Take 20 mg by mouth every morning.    Marland Kitchen TRAMADOL HCL 50 MG PO TABS Oral Take 50 mg by mouth every 6 (six) hours as needed.      BP 105/87  Pulse 67  Temp 99.2 F (37.3 C) (Rectal)  Resp 16  SpO2 97%  Physical Exam  Nursing note and vitals reviewed. Constitutional: He is oriented to person, place, and time. He appears well-developed.  HENT:  Head: Normocephalic and atraumatic.  Eyes: Conjunctivae and EOM are normal. Pupils are equal, round, and reactive to light.  Neck: Normal range of motion. Neck supple.  Cardiovascular: Normal rate and regular rhythm.   Pulmonary/Chest: Effort normal and breath sounds normal.  Abdominal: Soft. Bowel sounds are normal. He exhibits no distension. There is no tenderness. There is no rebound and no guarding.  Neurological: He is alert and oriented to person, place, and time. No cranial nerve deficit. Coordination normal.       CN 2-12 intact. No dysarthria appreciated by me. No aphasia, cerebellar exam is norma and sensory exam is normal. NIHSS - 0  Skin: Skin is warm. He is not diaphoretic.    ED Course  Procedures (including critical care time)  Labs Reviewed  GLUCOSE, CAPILLARY - Abnormal; Notable for the following:    Glucose-Capillary 26 (*)     All other components within normal limits  GLUCOSE, CAPILLARY - Abnormal; Notable for the following:    Glucose-Capillary 38 (*)     All other components within normal  limits  CBC WITH DIFFERENTIAL - Abnormal; Notable for the following:    RBC 4.07 (*)     Hemoglobin 12.1 (*)     HCT 35.1 (*)     Neutrophils Relative 80 (*)     Lymphocytes Relative 10 (*)     All other components within normal limits  BASIC METABOLIC PANEL - Abnormal; Notable for the following:    Sodium 128 (*)     Potassium 6.4 (*)     Chloride 90 (*)     CO2 11 (*)     Glucose, Bld 23 (*)     BUN 75 (*)  Creatinine, Ser 10.34 (*)     GFR calc non Af Amer 5 (*)     GFR calc Af Amer 6 (*)     All other components within normal limits  APTT  PROTIME-INR  URINALYSIS, ROUTINE W REFLEX MICROSCOPIC  HEMOGLOBIN A1C  URINE CULTURE  CULTURE, BLOOD (ROUTINE X 2)  CULTURE, BLOOD (ROUTINE X 2)  BASIC METABOLIC PANEL  LACTIC ACID, PLASMA   Ct Head Wo Contrast  11/11/2011  *RADIOLOGY REPORT*  Clinical Data: Dizziness.  Hypoglycemia.  Headache.  Recent seed implants for prostate cancer on 10/13/2011.  CT HEAD WITHOUT CONTRAST  Technique:  Contiguous axial images were obtained from the base of the skull through the vertex without contrast.  Comparison: 06/08/2008  Findings: There is no intra or extra-axial fluid collection or mass lesion.  The basilar cisterns and ventricles have a normal appearance.  There is no CT evidence for acute infarction or hemorrhage.  Bone windows show no acute findings. 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  IMPRESSION: No evidence for acute intracranial abnormality.  Original Report Authenticated By: Patterson Hammersmith, M.D.     1. Hypoglycemia   2. Medication adverse effect       MDM  Pt with NIDDM comes in with cc of hypoglycemia. Pt on metformin and sulfonylurea. No OD, no decrease in intake. We will recheck the blood sugar, and if still low, we will have to get d5, and octreotride on board. Pt already encouraged to eat.  Also, patient has slurred speech. NIHSS is 0, as i dont appreciate any slurred speech.  Still we will get  CT head, but with hypoglycemia, likely think that the "slurring" is secondary to that. Pt has no hx of strokes.         Derwood Kaplan, MD 11/11/11 2111

## 2011-11-11 NOTE — ED Notes (Signed)
ZOX:WRUE<AV> Expected date:11/11/11<BR> Expected time: 6:50 PM<BR> Means of arrival:Ambulance<BR> Comments:<BR> V70.1

## 2011-11-11 NOTE — ED Notes (Signed)
Pt attempting to eat sandwich at this time

## 2011-11-11 NOTE — ED Notes (Signed)
Pt drowsy but alert when aroused, MD notified of CBG and addt Dextrose given, family at bedside

## 2011-11-11 NOTE — H&P (Signed)
[facility] History and Physical  Tyren Dugar OZH:[id number] DOB: 07-17-1950 DOA: 11/11/2011   PCP: Georganna Skeans, MD   Chief Complaint:  Chief Complaint  Patient presents with  . Hypoglycemia     HPI:  61 yo man with prostate cancer s/p radioactive seed implants in 08/2011 was brought in today to [facility] emergency room with persistent hypoglycemia. The patient reports that he has not had problems like this before. He reports some chills at home and chronic back pain. He reports dysuria and that he has not had any changes in his diabetes medications. EMS came to the house one time this morning treated him with D50 and left. The wife had to call EMS again because he was hypoglycemic again later during the day. When he arrived in the emergency room his CBGs were in the 20s. He had 800 cc of urine in his bladder and was unable to void.  He was also noted to have a diffuse skin rash that apparently started only today. The patient was confused during hypoglycemia but he was alert and oriented after receiving D50. He denies any cough, sputum production, diarrhea.   Review of Systems:  Chronic joint pains, difficulty urination, chronic back pain. All the systems reviewed and per history of present illness or negative  Past Medical History  Diagnosis Date  . ED (erectile dysfunction)   . Mild asthma SEASONAL -  PT HAS NO INHALER  . Diabetes mellitus ORAL MEDS    TYPE II  . Prostate cancer 05/01/2011 BX    ADENOCARCINOMA,gLEASON= 3+3=6,VOLUNE=68.8CC ,PSA=7.40  . Arthritis LUMBAR BACK, SHOULDER, LEFT KNEE, RIGHT WRIST  . DJD (degenerative joint disease)   . Chronic joint pain   . GERD (gastroesophageal reflux disease)   . Seasonal allergies   . 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  1997 (15 YRS AGO)    STATES NO DRUG USE SINCE  . History of head injury 2010-  CLOSED  W/ CONCUSSION--   NO RESIDUAL  . Eczema    Past Surgical  History  Procedure Date  . Rotator cuff repair 01-10-2004    RIGHT  . Radioactive seed implant 09/07/2011    Procedure: RADIOACTIVE SEED IMPLANT;  Surgeon: Garnett Farm, MD;  Location: [facility];  Service: Urology;  Laterality: N/A;  84  seeds implanted  . Cystoscopy 09/07/2011    Procedure: CYSTOSCOPY;  Surgeon: Garnett Farm, MD;  Location: [facility];  Service: Urology;  Laterality: N/A;  no seeds noted in bladder   Social History:  reports that he quit smoking about 38 years ago. His smoking use included Cigarettes. He has never used smokeless tobacco. He reports that he does not drink alcohol or use illicit drugs. The patient lives home with his wife   No Known Allergies  Family History  Problem Relation Age of Onset  . Prostate cancer Maternal Uncle      Prior to Admission medications   Medication Sig Start Date End Date Taking? Authorizing Provider  aspirin 81 MG tablet Take 81 mg by mouth daily.   Yes Historical Provider, MD  cetirizine (ZYRTEC) 10 MG tablet Take 10 mg by mouth daily.   Yes Historical Provider, MD  cyclobenzaprine (FLEXERIL) 10 MG tablet Take 10 mg by mouth 3 (three) times daily.   Yes Historical Provider, MD  docusate sodium (COLACE) 100 MG capsule Take 100 mg by mouth 2 (two) times daily.   Yes Historical Provider,  MD  glimepiride (AMARYL) 4 MG tablet Take 4 mg by mouth daily before breakfast.   Yes Historical Provider, MD  HYDROcodone-acetaminophen (NORCO) 10-325 MG per tablet Take 1-2 tablets by mouth every 6 (six) hours as needed. Pain   Yes Historical Provider, MD  lisinopril (PRINIVIL,ZESTRIL) 5 MG tablet Take 5 mg by mouth daily.    Yes Historical Provider, MD  metformin (FORTAMET) 1000 MG (OSM) 24 hr tablet Take 1,000 mg by mouth 2 (two) times daily with a meal.    Yes Historical Provider, MD  Multiple Vitamins-Minerals (MULTIVITAMIN PO) Take 1 tablet by mouth daily.   Yes Historical Provider, MD  naproxen sodium  (ANAPROX) 220 MG tablet Take 220 mg by mouth as needed.   Yes Historical Provider, MD  omeprazole (PRILOSEC) 20 MG capsule Take 20 mg by mouth every morning.   Yes Historical Provider, MD  traMADol (ULTRAM) 50 MG tablet Take 50 mg by mouth every 6 (six) hours as needed.   Yes Historical Provider, MD   Physical Exam: Filed Vitals:   11/11/11 1902 11/11/11 2025  BP: 130/106 105/87  Pulse: 98 67  Temp: 99.2 F (37.3 C)   TempSrc: Rectal   Resp:  16  SpO2: 100% 97%     General:  Alert and oriented x3 after hypoglycemia was corrected  Eyes: Pupil equal and round react to light accommodation, extraocular movements intact  ENT: Normal appearing external ears, no pharyngeal exudate  Neck: No JVD no thyromegaly  Cardiovascular: Regular rate and rhythm, without murmurs rubs or gallops  Respiratory: Clear to auscultation bilaterally without wheezes rhonchi crackles  Abdomen: Slightly distended, significant tenderness in the suprapubic area where there is a palpable mass  Skin: Changes of chronic eczema on the right forearm, macular nonblanching rash throughout the legs, chest, back  Musculoskeletal: Significant tenderness over the joints of hips knees and lower back  Psychiatric: Appropriate and euthymic  Neurologic: Able to ambulate without difficulty, cranial nerves 3-12 are intact  Labs on Admission:  Basic Metabolic Panel:  Lab 11/11/11 1610  NA 128*  K 6.4*  CL 90*  CO2 11*  GLUCOSE 23*  BUN 75*  CREATININE 10.34*  CALCIUM 8.8  MG --  PHOS --   Liver Function Tests: No results found for this basename: AST:5,ALT:5,ALKPHOS:5,BILITOT:5,PROT:5,ALBUMIN:5 in the last 168 hours No results found for this basename: LIPASE:5,AMYLASE:5 in the last 168 hours No results found for this basename: AMMONIA:5 in the last 168 hours CBC:  Lab 11/11/11 2005  WBC 7.1  NEUTROABS 5.7  HGB 12.1*  HCT 35.1*  MCV 86.2  PLT 309   Cardiac Enzymes: No results found for this basename:  CKTOTAL:5,CKMB:5,CKMBINDEX:5,TROPONINI:5 in the last 168 hours  BNP (last 3 results) No results found for this basename: PROBNP:3 in the last 8760 hours CBG:  Lab 11/11/11 1953 11/11/11 1851  GLUCAP 38* 26*    Radiological Exams on Admission: Ct Head Wo Contrast  11/11/2011  *RADIOLOGY REPORT*  Clinical Data: Dizziness.  Hypoglycemia.  Headache.  Recent seed implants for prostate cancer on 10/13/2011.  CT HEAD WITHOUT CONTRAST  Technique:  Contiguous axial images were obtained from the base of the skull through the vertex without contrast.  Comparison: 06/08/2008  Findings: There is no intra or extra-axial fluid collection or mass lesion.  The basilar cisterns and ventricles have a normal appearance.  There is no CT evidence for acute infarction or hemorrhage.  Bone windows show no acute findings. 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  IMPRESSION: No evidence for acute intracranial abnormality.  Original Report Authenticated By: Patterson Hammersmith, M.D.    EKG is getting done as I am dictating  Assessment/Plan Principal Problem:  *Acute renal failure Active Problems:  Hypoglycemia associated with diabetes  Hyperkalemia  Urinary retention  DM, UNCOMPLICATED, TYPE II, UNCONTROLLED  ASTHMA  DEGENERATIVE JOINT DISEASE, HIPS  DEGENERATIVE DISC DISEASE, LUMBOSACRAL SPINE  Prostate cancer  Hypertension  Anemia  Hyponatremia  Rash   1. Acute renal failure due to urinary retention-patient had a Foley catheter placed in the emergency room with 1000 cc of urine coming out immediately. He will be placed on IV fluids and careful monitoring of intake and output will be done to make sure that he stays on the positive side. He will be taken off of ACE inhibitors and nonsteroidal anti-inflammatory drugs. He will need urological consultation in the morning.  2. Metabolic acidosis with anion gap of 17 - most likely due to renal failure with urinary retention. I will send also  for lactic acid. Patient will received intravenous and oral bicarbonate 3. Hyperkalemia due to ACE inhibitors and urinary retention - will treat with IV bicarbonate oral Kayexalate and repeat basic metabolic profile. Emergent EKG is getting obtained now. I am avoiding treating with IV insulin as patient has severe hypoglycemia. 4. Severe hypoglycemia due to sulfonylurea in the setting of worsening renal function. I suspect the patient will stay hypoglycemic through the night. Will treat with IV dextrose drip, regular diet. Patient may require D. 10 infusion 5. Mild anemia-without bleeding we'll monitor 6. Diffuse body rash and chills - there's a potential concern for bacterial infection - will send blood cultures and urine culture and start empiric Zosyn. Alternatively the patient could have just a allergic reaction to one of his medications. 7. Prostate cancer status post radioactive seed implants  Code Status: Full Family Communication: Mother and wife at bedside Disposition Plan: Home  Time spent: One hour  Jaiel Saraceno [facility] Pager [phone]  If 7PM-7AM, please contact night-coverage [email or web address] Password [facility] 11/11/2011, 9:12 PM

## 2011-11-11 NOTE — Progress Notes (Signed)
ANTIBIOTIC CONSULT NOTE - INITIAL  Pharmacy Consult for Zosyn Indication: r/o UTI  No Known Allergies  Patient Measurements:   TBW: 91 kg  Vital Signs: Temp: 99.2 F (37.3 C) (07/28 1902) Temp src: Rectal (07/28 1902) BP: 105/87 mmHg (07/28 2025) Pulse Rate: 67  (07/28 2025) Intake/Output from previous day:   Intake/Output from this shift:    Labs:  [facility] 11/11/11 2005  WBC 7.1  HGB 12.1*  PLT 309  LABCREA --  CREATININE 10.34*   The CrCl is unknown because both a height and weight (above a minimum accepted value) are required for this calculation. No results found for this basename: VANCOTROUGH:2,VANCOPEAK:2,VANCORANDOM:2,GENTTROUGH:2,GENTPEAK:2,GENTRANDOM:2,TOBRATROUGH:2,TOBRAPEAK:2,TOBRARND:2,AMIKACINPEAK:2,AMIKACINTROU:2,AMIKACIN:2, in the last 72 hours   Microbiology: No results found for this or any previous visit (from the past 720 hour(s)).  Medical History: Past Medical History  Diagnosis Date  . ED (erectile dysfunction)   . Mild asthma SEASONAL -  PT HAS NO INHALER  . Diabetes mellitus ORAL MEDS    TYPE II  . Prostate cancer 05/01/2011 BX    ADENOCARCINOMA,gLEASON= 3+3=6,VOLUNE=68.8CC ,PSA=7.40  . Arthritis LUMBAR BACK, SHOULDER, LEFT KNEE, RIGHT WRIST  . DJD (degenerative joint disease)   . Chronic joint pain   . GERD (gastroesophageal reflux disease)   . Seasonal allergies   . 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  1997 (15 YRS AGO)    STATES NO DRUG USE SINCE  . History of head injury 2010-  CLOSED  W/ CONCUSSION--   NO RESIDUAL  . Eczema     Medications:  Anti-infectives     Start     Dose/Rate Route Frequency Ordered Stop   11/11/11 2200   piperacillin-tazobactam (ZOSYN) IVPB 2.25 g        2.25 g 100 mL/hr over 30 Minutes Intravenous 3 times per day 11/11/11 2142           Assessment:  61 yo male with renal failure, dysuria, severe hypoglycemia (probable sulfonylurea  reaction)  Diffuse body rash/chills noted-begin Zosyn empirically  Clearance < 10 ml/min, anticipate will improve with hydration, etc  Goal of Therapy:  Antibiotic dose and schedule appropriate for renal function  Plan:  Zosyn 2.25 gm q8h, adjust dose/schedule as renal function improves. Monitor cultures.  Otho Bellows PharmD Pager [phone] 11/11/2011,9:57 PM

## 2011-11-11 NOTE — ED Notes (Signed)
Pt presents with blood sugar of 26, AMS, clammy, confused

## 2011-11-12 DIAGNOSIS — M5137 Other intervertebral disc degeneration, lumbosacral region: Secondary | ICD-10-CM

## 2011-11-12 DIAGNOSIS — C61 Malignant neoplasm of prostate: Secondary | ICD-10-CM

## 2011-11-12 LAB — GLUCOSE, CAPILLARY
Glucose-Capillary: 28 mg/dL — CL (ref 70–99)
Glucose-Capillary: 97 mg/dL (ref 70–99)
Glucose-Capillary: 21 mg/dL — CL (ref 70–99)
Glucose-Capillary: 41 mg/dL — CL (ref 70–99)
Glucose-Capillary: 49 mg/dL — ABNORMAL LOW (ref 70–99)
Glucose-Capillary: 65 mg/dL — ABNORMAL LOW (ref 70–99)
Glucose-Capillary: 144 mg/dL — ABNORMAL HIGH (ref 70–99)
Glucose-Capillary: 87 mg/dL (ref 70–99)
Glucose-Capillary: 73 mg/dL (ref 70–99)
Glucose-Capillary: 156 mg/dL — ABNORMAL HIGH (ref 70–99)
Glucose-Capillary: 113 mg/dL — ABNORMAL HIGH (ref 70–99)

## 2011-11-12 LAB — HEMOGLOBIN A1C: Mean Plasma Glucose: 146 mg/dL — ABNORMAL HIGH (ref <117)

## 2011-11-12 LAB — BASIC METABOLIC PANEL
BUN: 61 mg/dL — ABNORMAL HIGH (ref 6–23)
BUN: 32 mg/dL — ABNORMAL HIGH (ref 6–23)
BUN: 20 mg/dL (ref 6–23)
CO2: 20 mEq/L (ref 19–32)
CO2: 25 mEq/L (ref 19–32)
Calcium: 8.6 mg/dL (ref 8.4–10.5)
Chloride: 95 mEq/L — ABNORMAL LOW (ref 96–112)
Chloride: 96 mEq/L (ref 96–112)
Creatinine, Ser: 7.12 mg/dL — ABNORMAL HIGH (ref 0.50–1.35)
Creatinine, Ser: 3.34 mg/dL — ABNORMAL HIGH (ref 0.50–1.35)
Creatinine, Ser: 2.1 mg/dL — ABNORMAL HIGH (ref 0.50–1.35)
GFR calc Af Amer: 9 mL/min — ABNORMAL LOW (ref >90)
GFR calc Af Amer: 22 mL/min — ABNORMAL LOW (ref >90)
Glucose, Bld: 154 mg/dL — ABNORMAL HIGH (ref 70–99)
Glucose, Bld: 60 mg/dL — ABNORMAL LOW (ref 70–99)
Potassium: 5.7 mEq/L — ABNORMAL HIGH (ref 3.5–5.1)

## 2011-11-12 LAB — CBC
Hemoglobin: 11.3 g/dL — ABNORMAL LOW (ref 13.0–17.0)
MCH: 29.8 pg (ref 26.0–34.0)
Platelets: 303 10*3/uL (ref 150–400)
RBC: 3.79 MIL/uL — ABNORMAL LOW (ref 4.22–5.81)
WBC: 6.1 10*3/uL (ref 4.0–10.5)

## 2011-11-12 LAB — COMPREHENSIVE METABOLIC PANEL
BUN: 45 mg/dL — ABNORMAL HIGH (ref 6–23)
CO2: 22 mEq/L (ref 19–32)
Calcium: 8.6 mg/dL (ref 8.4–10.5)
Chloride: 96 mEq/L (ref 96–112)
Creatinine, Ser: 4.78 mg/dL — ABNORMAL HIGH (ref 0.50–1.35)
GFR calc Af Amer: 14 mL/min — ABNORMAL LOW (ref >90)
GFR calc non Af Amer: 12 mL/min — ABNORMAL LOW (ref >90)
Glucose, Bld: 42 mg/dL — CL (ref 70–99)
Total Bilirubin: 0.2 mg/dL — ABNORMAL LOW (ref 0.3–1.2)

## 2011-11-12 MED ORDER — ENOXAPARIN SODIUM 30 MG/0.3ML ~~LOC~~ SOLN
30.0000 mg | SUBCUTANEOUS | Status: DC
  Administered 2011-11-12: 30 mg via SUBCUTANEOUS
  Filled 2011-11-12 (×2): qty 0.3

## 2011-11-12 MED ORDER — DEXTROSE 50 % IV SOLN
50.0000 mL | Freq: Once | INTRAVENOUS | Status: AC | PRN
  Administered 2011-11-12: 50 mL via INTRAVENOUS
  Filled 2011-11-12: qty 50

## 2011-11-12 MED ORDER — DEXTROSE 50 % IV SOLN
INTRAVENOUS | Status: AC
  Administered 2011-11-12: 50 mL via INTRAVENOUS
  Filled 2011-11-12: qty 50

## 2011-11-12 MED ORDER — DEXTROSE 50 % IV SOLN
50.0000 mL | Freq: Once | INTRAVENOUS | Status: AC | PRN
  Administered 2011-11-12: 50 mL via INTRAVENOUS

## 2011-11-12 MED ORDER — DEXTROSE 50 % IV SOLN
INTRAVENOUS | Status: AC
  Administered 2011-11-12: 50 mL
  Filled 2011-11-12: qty 50

## 2011-11-12 MED ORDER — DEXTROSE 50 % IV SOLN
50.0000 mL | Freq: Once | INTRAVENOUS | Status: AC | PRN
  Filled 2011-11-12: qty 50

## 2011-11-12 MED ORDER — MUPIROCIN 2 % EX OINT
1.0000 "application " | TOPICAL_OINTMENT | Freq: Two times a day (BID) | CUTANEOUS | Status: AC
  Administered 2011-11-12 – 2011-11-16 (×10): 1 via NASAL
  Filled 2011-11-12: qty 22

## 2011-11-12 MED ORDER — CHLORHEXIDINE GLUCONATE CLOTH 2 % EX PADS
6.0000 | MEDICATED_PAD | Freq: Every day | CUTANEOUS | Status: DC
  Administered 2011-11-12: 6 via TOPICAL

## 2011-11-12 MED ORDER — DEXTROSE 50 % IV SOLN: 50.0000 mL | Freq: Once | INTRAVENOUS | Status: DC | PRN

## 2011-11-12 MED ORDER — PNEUMOCOCCAL VAC POLYVALENT 25 MCG/0.5ML IJ INJ
0.5000 mL | INJECTION | Freq: Once | INTRAMUSCULAR | Status: AC
  Administered 2011-11-12: 0.5 mL via INTRAMUSCULAR
  Filled 2011-11-12: qty 0.5

## 2011-11-12 MED ORDER — PIPERACILLIN-TAZOBACTAM 3.375 G IVPB
3.3750 g | Freq: Three times a day (TID) | INTRAVENOUS | Status: DC
  Administered 2011-11-12 – 2011-11-17 (×15): 3.375 g via INTRAVENOUS
  Filled 2011-11-12 (×18): qty 50

## 2011-11-12 NOTE — Progress Notes (Signed)
ANTIBIOTIC CONSULT NOTE  Pharmacy Consult for Zosyn Indication: Sepsis, urinary  No Known Allergies  Patient Measurements: Height: 5\' 8"  (172.7 cm) Weight: 204 lb 9.4 oz (92.8 kg) IBW/kg (Calculated) : 68.4  TBW: 91 kg  Vital Signs: Temp: 98.8 F (37.1 C) (07/29 1200) Temp src: Oral (07/29 1200) BP: 134/74 mmHg (07/29 1200) Pulse Rate: 93  (07/29 1200) Intake/Output from previous day: 07/28 0701 - 07/29 0700 In: 3190 [P.O.:1440; I.V.:1650; IV Piggyback:100] Out: 5750 [Urine:5750] Intake/Output from this shift: Total I/O In: 1040 [P.O.:240; I.V.:800] Out: 1150 [Urine:1150]  Labs:  Basename 11/12/11 1102 11/12/11 0445 11/12/11 0051 11/11/11 2005  WBC -- 6.1 -- 7.1  HGB -- 11.3* -- 12.1*  PLT -- 303 -- 309  LABCREA -- -- -- --  CREATININE 3.34* 4.78* 7.12* --   Estimated Creatinine Clearance: 26.3 ml/min (by C-G formula based on Cr of 3.34).   Microbiology: Recent Results (from the past 720 hour(s))  MRSA PCR SCREENING     Status: Abnormal   Collection Time   11/11/11 11:26 PM      Component Value Range Status Comment   MRSA by PCR POSITIVE (*) NEGATIVE Final     Medical History:   Medications:  Anti-infectives     Start     Dose/Rate Route Frequency Ordered Stop   11/11/11 2200   piperacillin-tazobactam (ZOSYN) IVPB 2.25 g        2.25 g 100 mL/hr over 30 Minutes Intravenous 3 times per day 11/11/11 2142           Assessment:  61 yo male with renal failure, dysuria, severe hypoglycemia (probable sulfonylurea reaction)  Day #3 Zosyn for severe sepsis  Initial dose 2.25gm IV q8h due to CrCl < 10 ml/min, now improving with hydration  Goal of Therapy:  Antibiotic dose and schedule appropriate for renal function  Plan:   Increase Zosyn 3.375gm IV q8h (4hr extended infusions) Follow up renal function & cultures  Loralee Pacas, PharmD, BCPS Pager: [phone] 11/12/2011,12:56 PM

## 2011-11-12 NOTE — Progress Notes (Signed)
CARE MANAGEMENT NOTE 11/12/2011  Patient:  MAXEN, ROWLAND   Account Number:  [id number]  Date Initiated:  11/12/2011  Documentation initiated by:  DAVIS,RHONDA  Subjective/Objective Assessment:   admission glucose 11, k+6.4, ams,iv bicarb.,iv d50,     Action/Plan:   lives at home   Anticipated DC Date:  11/15/2011   Anticipated DC Plan:  HOME/SELF CARE  In-house referral  NA      DC Planning Services  NA      [facility] Choice  NA   Choice offered to / List presented to:  NA   DME arranged  NA      DME agency  NA     HH arranged  NA      HH agency  NA   Status of service:  In process, will continue to follow Medicare Important Message given?  NA - LOS <3 / Initial given by admissions (If response is "NO", the following Medicare IM given date fields will be blank) Date Medicare IM given:   Date Additional Medicare IM given:    Discharge Disposition:    Per UR Regulation:  Reviewed for med. necessity/level of care/duration of stay  If discussed at Long Length of Stay Meetings, dates discussed:    Comments:  [id number] Earlene Plater, RN, BSN, CCM: CHART REVIEWED AND UPDATED. NO DISCHARGE NEEDS PRESENT AT THIS TIME. CASE MANAGEMENT [phone]

## 2011-11-12 NOTE — Progress Notes (Addendum)
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  States he has no fever or chills at present.  States that he recently saw his urologist Who prescribed him increasing doses of Vesicare.  Patient has been taking his diabetic medications Which have included metformin and Amaryl, over the past couple of days in addition to Mobic, Naprosyn and trazodone Denies nausea, vomiting, blurred vision, double vision, chest pain, shortness of breath   Objective    Interim History: Database reviewed in entirety Nursing reports no complaints or issues this morning  Subjective: Telemetry = sinus rhythm, occasional PVCs  Objective: Filed Vitals:   11/12/11 0200 11/12/11 0400 11/12/11 0600 11/12/11 0800  BP: 135/81 151/87 142/81   Pulse: 103 99 99   Temp:  100.7 F (38.2 C)  98.3 F (36.8 C)  TempSrc:  Oral  Oral  Resp: 26 24 22    Height:      Weight:      SpO2: 100% 98% 98%     Intake/Output Summary (Last 24 hours) at 11/12/11 8119 Last data filed at 11/12/11 0700  Gross per 24 hour  Intake   3190 ml  Output   5750 ml  Net  -2560 ml    Exam:  General: Alert pleasant African American male in no distress.  Noted cyst over left eyebrow Cardiovascular: S1-S2 no murmur rub or gallop regular rate rhythm Respiratory: Clinically clear no tactile vocal resonance and fremitus Abdomen: Soft nontender nondistended no rebound no  guarding no organomegaly no CVA tenderness Skin fine petechial rash over upper torso and back Neuro motor grossly intact, sensory grossly intact, moving all 4 limbs  Data Reviewed: Basic Metabolic Panel:  Lab 11/12/11 1478 11/12/11 0051 11/11/11 2130 11/11/11 2005  NA 132* 130* 125* 128*  K 5.3* 5.7* -- --  CL 96 95* 90* 90*  CO2 22 20 14* 11*  GLUCOSE 42* 154* 33* 23*  BUN 45* 61* 76* 75*  CREATININE 4.78* 7.12* 9.84* 10.34*  CALCIUM 8.6 8.4 8.8 8.8  MG -- -- -- --  PHOS -- -- -- --   Liver Function Tests:  Lab 11/12/11 0445 11/11/11 2130  AST 24 23  ALT 40 40  ALKPHOS 64 69  BILITOT 0.2* 0.1*  PROT 6.7 7.1  ALBUMIN 2.8* 3.0*   No results found for this basename: LIPASE:5,AMYLASE:5 in the last 168 hours No results found for this basename: AMMONIA:5 in the last 168 hours CBC:  Lab 11/12/11 0445 11/11/11 2005  WBC 6.1 7.1  NEUTROABS -- 5.7  HGB 11.3* 12.1*  HCT 31.5* 35.1*  MCV 83.1 86.2  PLT 303 309   Cardiac Enzymes: No results found for this basename: CKTOTAL:5,CKMB:5,CKMBINDEX:5,TROPONINI:5 in the last 168 hours BNP: No components found with this basename: POCBNP:5 CBG:  Lab 11/12/11 0742 11/12/11 0336 11/12/11 0303 11/12/11 0109 11/12/11 0044  GLUCAP 144* 105* 21* 97 29*    Recent Results (from the past 240 hour(s))  MRSA PCR SCREENING     Status: Abnormal  Collection Time   11/11/11 11:26 PM      Component Value Range Status Comment   MRSA by PCR POSITIVE (*) NEGATIVE Final      Studies:              All Imaging reviewed and is as per above notation   Scheduled Meds:   . aspirin EC  81 mg Oral Daily  . Chlorhexidine Gluconate Cloth  6 each Topical Q0600  . cyclobenzaprine  10 mg Oral TID  . dextrose 5 % and 0.45 % NaCl with KCl 20 mEq/L   Intravenous Once  . dextrose  1 ampule Intravenous Once  . dextrose      . dextrose      . dextrose      . dextrose      . enoxaparin (LOVENOX) injection  40 mg Subcutaneous QHS  . lidocaine      .  mupirocin ointment  1 application Nasal BID  . pantoprazole  40 mg Oral Q1200  . piperacillin-tazobactam (ZOSYN)  IV  2.25 g Intravenous Q8H  . pneumococcal 23 valent vaccine  0.5 mL Intramuscular Once  . sodium bicarbonate  100 mEq Intravenous Once  . sodium bicarbonate  650 mg Oral BID  . sodium chloride  500 mL Intravenous Once  . sodium chloride  3 mL Intravenous Q12H  . sodium polystyrene  30 g Oral Once  . DISCONTD: octreotide  100 mcg Subcutaneous Once   Continuous Infusions:   . dextrose 100 mL/hr at 11/12/11 0528  .  sodium bicarbonate infusion 1000 mL 100 mL/hr at 11/12/11 0019  . DISCONTD: sodium chloride    . DISCONTD: dextrose 5 % and 0.9% NaCl 100 mL/hr at 11/11/11 2157     Assessment/Plan: 1. Severe sepsis likely secondary to urinary origin-continue Zosyn IV for now.  Continue IV fluid repletion.  Precipitants would include most likely urinary source, given recent instrumentation of bladder and history of prostate cancer-will consider involving urology in patient's care and followup blood cultures, urine cultures.  Patient is hemodynamically stable currently but does have some low grade temperature elevations and spiked to 100.7 overnight. 2. Lactic acidosis-multifactorial.  Potentially secondary to Amaryl and metformin use.  See below 3. Acute renal insufficiency-baseline creatinine is 1.04  08/31/2011 and on admission 11/12/2011 was 10.34.  Continue hydration with IV fluids however will need volume as well as dextrose given patient has significant renal insufficiency from baseline.  We will attempt to change to D5 normal saline later today.  Continue oral bicarbonate for now 4. Hyponatremia-possibly secondary to severe sepsis vs. acute kidney injury-see above discussion regarding fluids 5. Severe hypoglycemia-potentially secondary to #3, and altered pharmacokinetics of metformin/sulfonylurea-should fully return as kidney function results-if not, would consider checking a free  am cortisol 6. Pain in lower abdomen-patient states he has some discomfort in the stomach and was started on Vesicare which was titrated upwards-this has a 3-5% risk of urinary tract infection and about a 1% risk of urinary retention and could be the etiology presenting illness. 7. Hypertension-hold hypertensive medications in view of acute renal insufficiency. 8. History of prostate cancer-will consult urology/renal if renal insuff does not resolve.  Code Status: Full Family Communication: None at bedside-patient updated in detail Disposition Plan: ICU for now given multiple issues.  Likely transition to step down/telemetry 1-2 days   Pleas Koch, MD  [facility] Pager [phone] 11/12/2011, 8:08 AM    LOS: 1 day

## 2011-11-13 LAB — GLUCOSE, CAPILLARY
Glucose-Capillary: 127 mg/dL — ABNORMAL HIGH (ref 70–99)
Glucose-Capillary: 181 mg/dL — ABNORMAL HIGH (ref 70–99)
Glucose-Capillary: 230 mg/dL — ABNORMAL HIGH (ref 70–99)
Glucose-Capillary: 233 mg/dL — ABNORMAL HIGH (ref 70–99)

## 2011-11-13 LAB — BASIC METABOLIC PANEL
BUN: 13 mg/dL (ref 6–23)
CO2: 22 mEq/L (ref 19–32)
CO2: 22 mEq/L (ref 19–32)
Calcium: 8.6 mg/dL (ref 8.4–10.5)
Calcium: 8.5 mg/dL (ref 8.4–10.5)
Calcium: 8.6 mg/dL (ref 8.4–10.5)
Chloride: 91 mEq/L — ABNORMAL LOW (ref 96–112)
Creatinine, Ser: 1.48 mg/dL — ABNORMAL HIGH (ref 0.50–1.35)
Creatinine, Ser: 1.41 mg/dL — ABNORMAL HIGH (ref 0.50–1.35)
Creatinine, Ser: 1.16 mg/dL (ref 0.50–1.35)
GFR calc non Af Amer: 50 mL/min — ABNORMAL LOW (ref >90)
GFR calc non Af Amer: 67 mL/min — ABNORMAL LOW (ref >90)
Sodium: 128 mEq/L — ABNORMAL LOW (ref 135–145)

## 2011-11-13 LAB — URINE CULTURE

## 2011-11-13 MED ORDER — VANCOMYCIN HCL 1000 MG IV SOLR
750.0000 mg | Freq: Two times a day (BID) | INTRAVENOUS | Status: DC
  Administered 2011-11-13 – 2011-11-15 (×5): 750 mg via INTRAVENOUS
  Filled 2011-11-13 (×5): qty 750

## 2011-11-13 MED ORDER — INSULIN ASPART 100 UNIT/ML ~~LOC~~ SOLN
0.0000 [IU] | Freq: Three times a day (TID) | SUBCUTANEOUS | Status: DC
  Administered 2011-11-13: 2 [IU] via SUBCUTANEOUS
  Administered 2011-11-13: 3 [IU] via SUBCUTANEOUS
  Administered 2011-11-14 (×2): 2 [IU] via SUBCUTANEOUS
  Administered 2011-11-14: 5 [IU] via SUBCUTANEOUS
  Administered 2011-11-15 (×2): 2 [IU] via SUBCUTANEOUS
  Administered 2011-11-15 – 2011-11-16 (×2): 1 [IU] via SUBCUTANEOUS
  Administered 2011-11-16: 2 [IU] via SUBCUTANEOUS
  Administered 2011-11-16: 1 [IU] via SUBCUTANEOUS
  Administered 2011-11-17: 2 [IU] via SUBCUTANEOUS
  Administered 2011-11-17: 1 [IU] via SUBCUTANEOUS
  Administered 2011-11-17 – 2011-11-18 (×3): 2 [IU] via SUBCUTANEOUS
  Administered 2011-11-19: 3 [IU] via SUBCUTANEOUS
  Administered 2011-11-19: 2 [IU] via SUBCUTANEOUS

## 2011-11-13 MED ORDER — HYDRALAZINE HCL 25 MG PO TABS
25.0000 mg | ORAL_TABLET | Freq: Four times a day (QID) | ORAL | Status: DC
  Administered 2011-11-13 – 2011-11-15 (×8): 25 mg via ORAL
  Filled 2011-11-13 (×13): qty 1

## 2011-11-13 MED ORDER — SODIUM CHLORIDE 0.9 % IV SOLN
INTRAVENOUS | Status: DC
  Administered 2011-11-13: 10:00:00 via INTRAVENOUS
  Administered 2011-11-13: 1000 mL via INTRAVENOUS

## 2011-11-13 MED ORDER — ENOXAPARIN SODIUM 40 MG/0.4ML ~~LOC~~ SOLN
40.0000 mg | SUBCUTANEOUS | Status: DC
  Administered 2011-11-13: 30 mg via SUBCUTANEOUS
  Administered 2011-11-14 – 2011-11-18 (×5): 40 mg via SUBCUTANEOUS
  Filled 2011-11-13 (×7): qty 0.4

## 2011-11-13 MED ORDER — HYDRALAZINE HCL 20 MG/ML IJ SOLN
10.0000 mg | Freq: Once | INTRAMUSCULAR | Status: AC
  Administered 2011-11-13: 10 mg via INTRAVENOUS
  Filled 2011-11-13: qty 1

## 2011-11-13 MED ORDER — TAMSULOSIN HCL 0.4 MG PO CAPS
0.4000 mg | ORAL_CAPSULE | Freq: Every day | ORAL | Status: DC
  Administered 2011-11-13 – 2011-11-18 (×6): 0.4 mg via ORAL
  Filled 2011-11-13 (×7): qty 1

## 2011-11-13 MED ORDER — CHLORHEXIDINE GLUCONATE CLOTH 2 % EX PADS
6.0000 | MEDICATED_PAD | Freq: Every day | CUTANEOUS | Status: DC
  Administered 2011-11-13 – 2011-11-18 (×6): 6 via TOPICAL

## 2011-11-13 NOTE — Progress Notes (Signed)
Pt's cbg decreased to ~230 within 2 hours after the D10 was turned off.  Cbg remained at 230 4 hours after D10 turned off.  BMET resulted, showing a continued improvement in K, BUN/CREAT, CO2.  Per K.Schorr, NP: 3 amps NaBicarb 150 meq D5 turned down to 50.  Will continue to monitor cbgs q 2 hours.   Pt's blood pressure increased to 176/104.  Confirmed and reported.  10 mg hydralazine given. BP 149/92 1 hour later.

## 2011-11-13 NOTE — Progress Notes (Signed)
ANTIBIOTIC CONSULT NOTE  Pharmacy Consult for Zosyn/Vanc Indication: Sepsis, urinary  No Known Allergies  Patient Measurements: Height: 5\' 8"  (172.7 cm) Weight: 204 lb 9.4 oz (92.8 kg) IBW/kg (Calculated) : 68.4   Vital Signs: Temp: 100.6 F (38.1 C) (07/30 0800) Temp src: Oral (07/30 0800) BP: 156/83 mmHg (07/30 0800) Pulse Rate: 111  (07/30 0800) Intake/Output from previous day: 07/29 0701 - 07/30 0700 In: 3690 [P.O.:240; I.V.:3350; IV Piggyback:100] Out: 5000 [Urine:5000] Intake/Output from this shift: Total I/O In: 200 [I.V.:150; IV Piggyback:50] Out: 1070 [Urine:1070]  Labs:  Basename 11/13/11 0320 11/12/11 1917 11/12/11 1102 11/12/11 0445 11/11/11 2005  WBC -- -- -- 6.1 7.1  HGB -- -- -- 11.3* 12.1*  PLT -- -- -- 303 309  LABCREA -- -- -- -- --  CREATININE 1.48* 2.10* 3.34* -- --   Estimated Creatinine Clearance: 59.4 ml/min (by C-G formula based on Cr of 1.48). 54 ml/mim/1.40m2 (normalized)   Microbiology: Recent Results (from the past 720 hour(s))  CULTURE, BLOOD (ROUTINE X 2)     Status: Normal (Preliminary result)   Collection Time   11/11/11  9:46 PM      Component Value Range Status Comment   Specimen Description BLOOD RIGHT ANTECUBITAL   Final    Special Requests BOTTLES DRAWN AEROBIC ONLY 5CC   Final    Culture  Setup Time 11/12/2011 01:40   Final    Culture     Final    Value:        BLOOD CULTURE RECEIVED NO GROWTH TO DATE CULTURE WILL BE HELD FOR 5 DAYS BEFORE ISSUING A FINAL NEGATIVE REPORT   Report Status PENDING   Incomplete   CULTURE, BLOOD (ROUTINE X 2)     Status: Normal (Preliminary result)   Collection Time   11/11/11 10:00 PM      Component Value Range Status Comment   Specimen Description BLOOD RIGHT HAND   Final    Special Requests BOTTLES DRAWN AEROBIC ONLY 2CC   Final    Culture  Setup Time 11/12/2011 01:40   Final    Culture     Final    Value:        BLOOD CULTURE RECEIVED NO GROWTH TO DATE CULTURE WILL BE HELD FOR 5 DAYS  BEFORE ISSUING A FINAL NEGATIVE REPORT   Report Status PENDING   Incomplete   MRSA PCR SCREENING     Status: Abnormal   Collection Time   11/11/11 11:26 PM      Component Value Range Status Comment   MRSA by PCR POSITIVE (*) NEGATIVE Final     Medical History:   Medications:  Anti-infectives     Start     Dose/Rate Route Frequency Ordered Stop   11/12/11 1400   piperacillin-tazobactam (ZOSYN) IVPB 3.375 g        3.375 g 12.5 mL/hr over 240 Minutes Intravenous Every 8 hours 11/12/11 1259     11/11/11 2200   piperacillin-tazobactam (ZOSYN) IVPB 2.25 g  Status:  Discontinued        2.25 g 100 mL/hr over 30 Minutes Intravenous 3 times per day 11/11/11 2142 11/12/11 1259         Assessment:  61 yo male with renal failure, dysuria, severe hypoglycemia (probable sulfonylurea reaction)  Day #3 Zosyn for severe sepsis, now adding vancomycin  Renal function much improved  Goal of Therapy:  Antibiotic dose and schedule appropriate for renal function Vancomycin troughs 15-20 mcg/ml  Plan:   Vancomycin 750mg   IV q12h  Check trough at steady state  Continue Zosyn 3.375gm IV q8h (4hr extended infusions) Follow up renal function & cultures  Loralee Pacas, PharmD, BCPS Pager: [phone] 11/13/2011,9:25 AM

## 2011-11-13 NOTE — Progress Notes (Signed)
[facility] follow-up progress note (same day)   Spoke with Dr. Ottelin who recommends keeping in foley for about 2 weeks and a voiding trial should be attempted in 2 weeks out-patient.  Rec's starting flomax 04. Mg as well Appreciate input  Jai Saatvik Thielman, MD [facility] (P) [phone]  

## 2011-11-13 NOTE — Progress Notes (Addendum)
PROGRESS NOTE  Cole Conrad ZOX:[id number] DOB: 1951-04-01 DOA: 11/11/2011 PCP: Georganna Skeans, MD  Brief narrative: 61 yr old male admitted 7/29 with severe sepsis, potentially 2/2 to Urinary cause.  He stented in potentially severe sepsis and was noted to be on multiple diabetic agents that can cause renal insufficiency.  He has improved to the point that he was transferred out of the ICU to SDU status.  His ABX have been adjusted and he remains hemodynamically stable.  I have consulted Urology 7/30 for recommendations and his family is aware of our thoughts.  Past medical history-As per Problem list Chart reviewed as below- H/o massive Rotator cuff tear, s/p acromioplasty 12/2003 diag c AdenoCa Prostate, S/p TRUS/BX 04/30/10, Gleason score elevated Arthritis Asthma DM Htn Prostate seed implantation  Consultants:  Urology, 7/30  Procedures:  CT head 10/2011 = no evidence for acute intracranial abnormality  Antibiotics:  Zosyn 7/28  Vancomycin started 7/30  Blood Cult x 2 pending  UC pending   Subjective  Chills at present.  Didn't sleep well overnight.  No nausea no vomiting no chest pain no shortness of breath Had 2 episodes of diarrhea yesterday. Denies nausea, vomiting, blurred vision, double vision, chest pain, shortness of breath    Objective    Interim History: Database reviewed in entirety   Subjective: Telemetry = sinus rhythm, occasional PVCs  Objective: Filed Vitals:   11/13/11 0400 11/13/11 0430 11/13/11 0600 11/13/11 0800  BP: 176/104 172/100 149/92 156/83  Pulse: 81 84 96 111  Temp: 98.4 F (36.9 C)   100.6 F (38.1 C)  TempSrc: Oral   Oral  Resp: 13 26 30 28   Height:      Weight:      SpO2: 97% 100% 98% 99%    Intake/Output Summary (Last 24 hours) at 11/13/11 0835 Last data filed at 11/13/11 0800  Gross per 24 hour  Intake   3570 ml  Output   5900 ml  Net  -2330 ml    Exam:  General: Alert pleasant African American male in no  distress.  Noted cyst over left eyebrow Cardiovascular: S1-S2 no murmur rub or gallop regular rate rhythm Respiratory: Clinically clear no tactile vocal resonance and fremitus Abdomen: Soft nontender nondistended no rebound no guarding no organomegaly no CVA tenderness Skin fine petechial rash over upper torso and back-patient states today that he has a history of eczema. Patient has another rash which is vesicular papular over the upper back and torso and which remains about the same as it has been since admit Neuro motor grossly intact, sensory grossly intact, moving all 4 limbs  Data Reviewed: Basic Metabolic Panel:  Lab 11/13/11 8119 11/12/11 1917 11/12/11 1102 11/12/11 0445 11/12/11 0051  NA 128* 131* 134* 132* 130*  K 4.6 5.0 -- -- --  CL 91* 95* 96 96 95*  CO2 26 25 24 22 20   GLUCOSE 234* 144* 60* 42* 154*  BUN 14 20 32* 45* 61*  CREATININE 1.48* 2.10* 3.34* 4.78* 7.12*  CALCIUM 8.6 8.6 8.6 8.6 8.4  MG -- -- -- -- --  PHOS -- -- -- -- --   Liver Function Tests:  Lab 11/12/11 0445 11/11/11 2130  AST 24 23  ALT 40 40  ALKPHOS 64 69  BILITOT 0.2* 0.1*  PROT 6.7 7.1  ALBUMIN 2.8* 3.0*   No results found for this basename: LIPASE:5,AMYLASE:5 in the last 168 hours No results found for this basename: AMMONIA:5 in the last 168 hours CBC:  Lab 11/12/11  0445 11/11/11 2005  WBC 6.1 7.1  NEUTROABS -- 5.7  HGB 11.3* 12.1*  HCT 31.5* 35.1*  MCV 83.1 86.2  PLT 303 309   Cardiac Enzymes: No results found for this basename: CKTOTAL:5,CKMB:5,CKMBINDEX:5,TROPONINI:5 in the last 168 hours BNP: No components found with this basename: POCBNP:5 CBG:  Lab 11/13/11 0629 11/13/11 0416 11/13/11 0147 11/13/11 0012 11/12/11 2232  GLUCAP 233* 230* 223* 270* 181*    Recent Results (from the past 240 hour(s))  MRSA PCR SCREENING     Status: Abnormal   Collection Time   11/11/11 11:26 PM      Component Value Range Status Comment   MRSA by PCR POSITIVE (*) NEGATIVE Final       Studies:              All Imaging reviewed and is as per above notation   Scheduled Meds:    . aspirin EC  81 mg Oral Daily  . Chlorhexidine Gluconate Cloth  6 each Topical Daily  . cyclobenzaprine  10 mg Oral TID  . enoxaparin (LOVENOX) injection  30 mg Subcutaneous Q24H  . hydrALAZINE  10 mg Intravenous Once  . insulin aspart  0-9 Units Subcutaneous TID WC  . mupirocin ointment  1 application Nasal BID  . piperacillin-tazobactam (ZOSYN)  IV  3.375 g Intravenous Q8H  . pneumococcal 23 valent vaccine  0.5 mL Intramuscular Once  . sodium bicarbonate  650 mg Oral BID  . sodium chloride  3 mL Intravenous Q12H  . DISCONTD: Chlorhexidine Gluconate Cloth  6 each Topical Q0600  . DISCONTD: pantoprazole  40 mg Oral Q1200  . DISCONTD: piperacillin-tazobactam (ZOSYN)  IV  2.25 g Intravenous Q8H   Continuous Infusions:    .  sodium bicarbonate infusion 1000 mL 50 mL/hr at 11/13/11 0500  . DISCONTD: dextrose Stopped (11/13/11 0015)     Assessment/Plan: 1. Severe sepsis likely secondary to urinary origin-continue Zosyn IV for now.  Continue IV fluid repletion.  Precipitants would include most likely urinary source, given recent instrumentation of bladder and history of prostate cancer-will consider involving urology,. Blood cultures, urine cultures are still pending.  Patient is hemodynamically stable currently but does have some low grade temperature elevations and spiked to 100.6 overnight-he has had diarrhea which is most likely consistent with antibiotic use and I have low suspicion that this is C. difficile colitis at present time.  Hemodynamically he has improved from yesterday.  Tachycardia is likely related to some anxiety-although could be secondary to infectious state as well-Would not at this stage broaden coverage from Iv Zosyn.  Will monitor overnight in step down. 2. Undifferentiated Lactic acidosis-multifactorial.  Potentially secondary to Amaryl and metformin use +/- sepsis.   Anion gap on admission was 28.  Current anion gap is 11.  3. Acute renal insufficiency-multifactorial-secondary to acute renal failure from Metformin + Acute Urinary retention secondary to Vesicare. baseline creatinine is 1.04  08/31/2011 and on admission 11/12/2011 was 10.34.  Discontinue  7/30+ bicarbonate.  Repeat be met every 12 hours 4. Hyponatremia-possibly secondary to severe sepsis vs. acute kidney injury-patient is not really in and is only drinking.  Depending on next BMET, may elect to continue IVF NS 100 cc/hr 5. Severe hypoglycemia-potentially secondary to #3, and altered pharmacokinetics of metformin/sulfonylurea-should fully return as kidney function results. 6. Pain in lower abdomen-patient states he has some discomfort in the stomach and was started on Vesicare which was titrated upwards-this has a 3-5% risk of urinary tract infection and about a  1% risk of urinary retention and could be the etiology presenting illness. 7. Hypertension-was on lisinopril at home.  Will hold for now and place on scheduled hydralazine 25 mg every 6 hourly with parameters 8. History of prostate cancer/Acute urinary retention-consulted urology this am.  Code Status: Full Family Communication: None at bedside-patient updated in detail Disposition Plan: SDU for now given multiple issues.  Likely transition to step down/telemetry 1-2 days.  Consulted Urologist Dr. Patsi Sears 7/30 for rec's.   Pleas Koch, MD  [facility] Pager [phone] 11/13/2011, 8:35 AM  Addend-Patient spiked a temp to 102.  Will rpt cultures.  Will add Vanc given immunocompromised (cancer) state.    LOS: 2 days

## 2011-11-13 NOTE — Progress Notes (Signed)
Pt currently on D10 @100  ml/hr NaBicarb 150 D5 @100  ml/hr and Pt's cbgs rapidly increasing and at midnight cbg=270. K. Schorr, NP paged. Orders received to turn D10 off and continue to monitor cbgs q 2 hours.

## 2011-11-13 NOTE — Progress Notes (Signed)
Foley clamped one hour and patient co bladder pressure.Foley unclamped for urine output

## 2011-11-14 ENCOUNTER — Inpatient Hospital Stay (HOSPITAL_COMMUNITY): Payer: Medicare Other

## 2011-11-14 DIAGNOSIS — R509 Fever, unspecified: Secondary | ICD-10-CM

## 2011-11-14 DIAGNOSIS — I10 Essential (primary) hypertension: Secondary | ICD-10-CM

## 2011-11-14 LAB — GLUCOSE, CAPILLARY: Glucose-Capillary: 259 mg/dL — ABNORMAL HIGH (ref 70–99)

## 2011-11-14 LAB — URINALYSIS, ROUTINE W REFLEX MICROSCOPIC
Bilirubin Urine: NEGATIVE
Specific Gravity, Urine: 1.016 (ref 1.005–1.030)
pH: 8 (ref 5.0–8.0)

## 2011-11-14 LAB — BASIC METABOLIC PANEL
BUN: 11 mg/dL (ref 6–23)
BUN: 13 mg/dL (ref 6–23)
CO2: 19 mEq/L (ref 19–32)
Calcium: 8.4 mg/dL (ref 8.4–10.5)
Calcium: 8.2 mg/dL — ABNORMAL LOW (ref 8.4–10.5)
Creatinine, Ser: 1.13 mg/dL (ref 0.50–1.35)
Creatinine, Ser: 1.27 mg/dL (ref 0.50–1.35)
GFR calc non Af Amer: 69 mL/min — ABNORMAL LOW (ref >90)
GFR calc non Af Amer: 60 mL/min — ABNORMAL LOW (ref >90)
Glucose, Bld: 246 mg/dL — ABNORMAL HIGH (ref 70–99)
Glucose, Bld: 207 mg/dL — ABNORMAL HIGH (ref 70–99)
Sodium: 128 mEq/L — ABNORMAL LOW (ref 135–145)

## 2011-11-14 LAB — CBC WITH DIFFERENTIAL/PLATELET
Basophils Relative: 1 % (ref 0–1)
Eosinophils Absolute: 0.1 10*3/uL (ref 0.0–0.7)
Hemoglobin: 12.4 g/dL — ABNORMAL LOW (ref 13.0–17.0)
Lymphs Abs: 1 10*3/uL (ref 0.7–4.0)
MCH: 29.5 pg (ref 26.0–34.0)
MCHC: 34.9 g/dL (ref 30.0–36.0)
MCV: 84.5 fL (ref 78.0–100.0)
Monocytes Absolute: 0.7 10*3/uL (ref 0.1–1.0)
Neutro Abs: 6.6 10*3/uL (ref 1.7–7.7)
Neutrophils Relative %: 78 % — ABNORMAL HIGH (ref 43–77)

## 2011-11-14 LAB — URINE MICROSCOPIC-ADD ON

## 2011-11-14 MED ORDER — SODIUM BICARBONATE 650 MG PO TABS
650.0000 mg | ORAL_TABLET | Freq: Two times a day (BID) | ORAL | Status: DC
  Administered 2011-11-14 – 2011-11-19 (×10): 650 mg via ORAL
  Filled 2011-11-14 (×11): qty 1

## 2011-11-14 MED ORDER — SIMETHICONE 80 MG PO CHEW
160.0000 mg | CHEWABLE_TABLET | Freq: Four times a day (QID) | ORAL | Status: DC | PRN
  Administered 2011-11-14: 160 mg via ORAL
  Filled 2011-11-14 (×2): qty 2

## 2011-11-14 MED ORDER — ENSURE COMPLETE PO LIQD
237.0000 mL | Freq: Two times a day (BID) | ORAL | Status: DC
  Administered 2011-11-14 (×2): 237 mL via ORAL

## 2011-11-14 MED ORDER — SIMETHICONE 80 MG PO CHEW
160.0000 mg | CHEWABLE_TABLET | Freq: Four times a day (QID) | ORAL | Status: DC
  Administered 2011-11-14 – 2011-11-19 (×18): 160 mg via ORAL
  Filled 2011-11-14 (×22): qty 2

## 2011-11-14 NOTE — Plan of Care (Signed)
Problem: Phase I Progression Outcomes Goal: OOB as tolerated unless otherwise ordered Outcome: Progressing Pt transferred from the bed to the chair twice last night with no difficulty.   Problem: Phase II Progression Outcomes Goal: Obtain order to discontinue catheter if appropriate Outcome: Not Progressing Note from MD to leave foley for two weeks and perform an out patient voiding trial.

## 2011-11-14 NOTE — Progress Notes (Signed)
[facility] PROGRESS NOTE  Cole Conrad ZOX:[id number] DOB: 1950/05/12 DOA: 11/11/2011 PCP: Georganna Skeans, MD  Assessment/Plan: Principal Problem:  *Acute renal failure Active Problems:  DM, UNCOMPLICATED, TYPE II, UNCONTROLLED  ASTHMA  DEGENERATIVE JOINT DISEASE, HIPS  DEGENERATIVE DISC DISEASE, LUMBOSACRAL SPINE  Prostate cancer  Hypertension  Hypoglycemia associated with diabetes  Anemia  Hyperkalemia  Hyponatremia  Urinary retention  Rash  Fever 1. Severe sepsis Unknown etiology. Patient still with fever spikes with temp of 101.4 last night at 7pm, likely secondary to urinary origin- Continue IV fluid repletion. Precipitants would include most likely urinary source, given recent instrumentation of bladder and history of prostate cancer-will consider involving urology,. Blood cultures pending, urine cultures obtained on admission negative . Patient is hemodynamically stable currently but does have some low grade temperature elevations and spiked to 101.4 overnight-he hasn't had diarrhea x 3 days. Will repeat UA with cultures and sensitivities, check acute abdominal series. Hemodynamically he has improved. Tachycardia is likely related to some anxiety-although could be secondary to infectious state as well. Continue IV Zosyn and IV vancomycin. Will monitor overnight in step down. 2. Undifferentiated Lactic acidosis-multifactorial. Potentially secondary to Amaryl and metformin use +/- sepsis. Anion gap on admission was 28. Current anion gap is 11.  3. Acute renal insufficiency-multifactorial-secondary to acute renal failure from Metformin + Acute Urinary retention secondary to Vesicare. baseline creatinine is 1.04 08/31/2011 and on admission 11/12/2011 was 10.34. Discontinue sodium bicarbonate. Repeat be met every 12 hours 4. Hyponatremia-possibly secondary to severe sepsis vs. acute kidney injury-patient with poor oral intake, only drinking. Increae IVF. 5. Severe  hypoglycemia-potentially secondary to #3, and altered pharmacokinetics of metformin/sulfonylurea-should fully return as kidney function results. Improved. Off D5 and D10. CBGs > 100. Follow. 6. Pain in lower abdomen-patient states he has some discomfort in the stomach and was started on Vesicare which was titrated upwards-this has a 3-5% risk of urinary tract infection and about a 1% risk of urinary retention and could be the etiology presenting illness. 7. Hypertension-was on lisinopril at home. Will hold for now, continue scheduled hydralazine 25 mg every 6 hourly with parameters 8. History of prostate cancer/Acute urinary retention-consulted urology yesterday per Dr Mahala Menghini. Consult pending. Patient started on flomax and foley catheter to be kept in for next 2 weeks with voiding trial as outpatient per Dr Pandora Leiter note. 9. Fever- unkown etiology. Will repeat UA with cultures, check acute abdominal series. Repeat blood cultures pending. Continue empiric IV vancomycin and zosyn. Follow 10. Rash- Diffuse maculopapular rash. Slight improvement per patient. ?? Etiology. Myabe hypersensitivity reaction. Follow. 1.    Code Status: Full Family Communication: updated patient and daughter at bedside. Disposition Plan: Home when medically stable.   Brief narrative: 61 yr old male admitted 7/29 with severe sepsis, potentially 2/2 to Urinary cause. He stented in potentially severe sepsis and was noted to be on multiple diabetic agents that can cause renal insufficiency. He has improved to the point that he was transferred out of the ICU to SDU status. His ABX have been adjusted and he remains hemodynamically stable. I have consulted Urology 7/30 for recommendations and his family is aware of our thoughts.   Consultants:  Urology 7/30.Marland Kitchenper Dr Mahala Menghini, but no note seen.  Procedures: CT head 10/2011 = no evidence for acute intracranial abnormality     Antibiotics: Zosyn 7/28  Vancomycin started 7/30   Blood Cult x 2 pending  UC pending   HPI/Subjective: Patient states had 2 episodes of loose stool 3 days ago and  no BM since then. Patient feeling better however complaining of abdominal gas, and poor oral intake, states does not really like hospital food.  Objective: Filed Vitals:   11/14/11 0500 11/14/11 0520 11/14/11 0600 11/14/11 0800  BP:  172/96 168/91   Pulse: 92 89 90   Temp:    98.8 F (37.1 C)  TempSrc:    Oral  Resp: 27 28 26    Height:      Weight:      SpO2: 98% 100% 98%     Intake/Output Summary (Last 24 hours) at 11/14/11 0951 Last data filed at 11/14/11 0600  Gross per 24 hour  Intake   3070 ml  Output   2250 ml  Net    820 ml    Exam: General: Alert pleasant African American male in no distress. Noted cyst over left eyebrow  Cardiovascular: Tachy S1-S2 no murmur rub or gallop regular rate rhythm  Respiratory: Clinically clear no tactile vocal resonance and fremitus  Abdomen: Soft nontender nondistended no rebound no guarding no organomegaly no CVA tenderness  Skin fine petechial rash over upper torso and back-patient states that he has a history of eczema.  Patient has another rash which is vesicular papular over the upper back and torso and which slightly improved per patient since admit  Neuro motor grossly intact, sensory grossly intact, moving all 4 limbs       Data Reviewed: Basic Metabolic Panel:  Lab 11/13/11 1610 11/13/11 0945 11/13/11 0320 11/12/11 1917 11/12/11 1102  NA 130* 127* 128* 131* 134*  K 4.0 4.3 4.6 5.0 5.1  CL 97 91* 91* 95* 96  CO2 22 22 26 25 24   GLUCOSE 178* 261* 234* 144* 60*  BUN 11 13 14 20  32*  CREATININE 1.16 1.41* 1.48* 2.10* 3.34*  CALCIUM 8.6 8.5 8.6 8.6 8.6  MG -- -- -- -- --  PHOS -- -- -- -- --   Liver Function Tests:  Lab 11/12/11 0445 11/11/11 2130  AST 24 23  ALT 40 40  ALKPHOS 64 69  BILITOT 0.2* 0.1*  PROT 6.7 7.1  ALBUMIN 2.8* 3.0*   No results found for this basename: LIPASE:5,AMYLASE:5 in  the last 168 hours No results found for this basename: AMMONIA:5 in the last 168 hours CBC:  Lab 11/14/11 0315 11/12/11 0445 11/11/11 2005  WBC 8.5 6.1 7.1  NEUTROABS 6.6 -- 5.7  HGB 12.4* 11.3* 12.1*  HCT 35.5* 31.5* 35.1*  MCV 84.5 83.1 86.2  PLT 311 303 309   Cardiac Enzymes: No results found for this basename: CKTOTAL:5,CKMB:5,CKMBINDEX:5,TROPONINI:5 in the last 168 hours BNP (last 3 results) No results found for this basename: PROBNP:3 in the last 8760 hours CBG:  Lab 11/14/11 0817 11/13/11 2217 11/13/11 1639 11/13/11 1137 11/13/11 0816  GLUCAP 177* 165* 200* 234* 222*    Recent Results (from the past 240 hour(s))  URINE CULTURE     Status: Normal   Collection Time   11/11/11  9:19 PM      Component Value Range Status Comment   Specimen Description URINE, CATHETERIZED   Final    Special Requests NONE   Final    Culture  Setup Time 11/12/2011 01:38   Final    Colony Count NO GROWTH   Final    Culture NO GROWTH   Final    Report Status 11/13/2011 FINAL   Final   CULTURE, BLOOD (ROUTINE X 2)     Status: Normal (Preliminary result)   Collection Time  11/11/11  9:46 PM      Component Value Range Status Comment   Specimen Description BLOOD RIGHT ANTECUBITAL   Final    Special Requests BOTTLES DRAWN AEROBIC ONLY 5CC   Final    Culture  Setup Time 11/12/2011 01:40   Final    Culture     Final    Value:        BLOOD CULTURE RECEIVED NO GROWTH TO DATE CULTURE WILL BE HELD FOR 5 DAYS BEFORE ISSUING A FINAL NEGATIVE REPORT   Report Status PENDING   Incomplete   CULTURE, BLOOD (ROUTINE X 2)     Status: Normal (Preliminary result)   Collection Time   11/11/11 10:00 PM      Component Value Range Status Comment   Specimen Description BLOOD RIGHT HAND   Final    Special Requests BOTTLES DRAWN AEROBIC ONLY 2CC   Final    Culture  Setup Time 11/12/2011 01:40   Final    Culture     Final    Value:        BLOOD CULTURE RECEIVED NO GROWTH TO DATE CULTURE WILL BE HELD FOR 5 DAYS BEFORE  ISSUING A FINAL NEGATIVE REPORT   Report Status PENDING   Incomplete   MRSA PCR SCREENING     Status: Abnormal   Collection Time   11/11/11 11:26 PM      Component Value Range Status Comment   MRSA by PCR POSITIVE (*) NEGATIVE Final   CULTURE, BLOOD (ROUTINE X 2)     Status: Normal (Preliminary result)   Collection Time   11/13/11  9:40 AM      Component Value Range Status Comment   Specimen Description BLOOD LEFT ARM   Final    Special Requests BOTTLES DRAWN AEROBIC AND ANAEROBIC 5CC EACH   Final    Culture  Setup Time 11/13/2011 14:51   Final    Culture     Final    Value:        BLOOD CULTURE RECEIVED NO GROWTH TO DATE CULTURE WILL BE HELD FOR 5 DAYS BEFORE ISSUING A FINAL NEGATIVE REPORT   Report Status PENDING   Incomplete   CULTURE, BLOOD (ROUTINE X 2)     Status: Normal (Preliminary result)   Collection Time   11/13/11  9:41 AM      Component Value Range Status Comment   Specimen Description BLOOD LEFT ARM   Final    Special Requests BOTTLES DRAWN AEROBIC AND ANAEROBIC 5 CC EACH   Final    Culture  Setup Time 11/13/2011 14:51   Final    Culture     Final    Value:        BLOOD CULTURE RECEIVED NO GROWTH TO DATE CULTURE WILL BE HELD FOR 5 DAYS BEFORE ISSUING A FINAL NEGATIVE REPORT   Report Status PENDING   Incomplete      Studies: Ct Head Wo Contrast  11/11/2011  *RADIOLOGY REPORT*  Clinical Data: Dizziness.  Hypoglycemia.  Headache.  Recent seed implants for prostate cancer on 10/13/2011.  CT HEAD WITHOUT CONTRAST  Technique:  Contiguous axial images were obtained from the base of the skull through the vertex without contrast.  Comparison: 06/08/2008  Findings: There is no intra or extra-axial fluid collection or mass lesion.  The basilar cisterns and ventricles have a normal appearance.  There is no CT evidence for acute infarction or hemorrhage.  Bone windows show no acute findings. 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  IMPRESSION: No evidence for acute  intracranial abnormality.  Original Report Authenticated By: Patterson Hammersmith, M.D.    Scheduled Meds:   . aspirin EC  81 mg Oral Daily  . Chlorhexidine Gluconate Cloth  6 each Topical Daily  . cyclobenzaprine  10 mg Oral TID  . enoxaparin (LOVENOX) injection  40 mg Subcutaneous Q24H  . feeding supplement  237 mL Oral BID BM  . hydrALAZINE  25 mg Oral Q6H  . insulin aspart  0-9 Units Subcutaneous TID WC  . mupirocin ointment  1 application Nasal BID  . piperacillin-tazobactam (ZOSYN)  IV  3.375 g Intravenous Q8H  . sodium bicarbonate  650 mg Oral BID  . sodium chloride  3 mL Intravenous Q12H  . Tamsulosin HCl  0.4 mg Oral QPC supper  . vancomycin  750 mg Intravenous Q12H  . DISCONTD: enoxaparin (LOVENOX) injection  30 mg Subcutaneous Q24H   Continuous Infusions:   . sodium chloride 1,000 mL (11/13/11 2027)  . DISCONTD:  sodium bicarbonate infusion 1000 mL 50 mL/hr at 11/13/11 0500    Principal Problem:  *Acute renal failure Active Problems:  DM, UNCOMPLICATED, TYPE II, UNCONTROLLED  ASTHMA  DEGENERATIVE JOINT DISEASE, HIPS  DEGENERATIVE DISC DISEASE, LUMBOSACRAL SPINE  Prostate cancer  Hypertension  Hypoglycemia associated with diabetes  Anemia  Hyperkalemia  Hyponatremia  Urinary retention  Rash  Fever    Time spent: 45 mins    [facility] Pager [phone]. If 8PM-8AM, please contact night-coverage at [email or web address], password [facility] 11/14/2011, 9:51 AM  LOS: 3 days

## 2011-11-14 NOTE — Progress Notes (Signed)
Pt with complaint of a Headache. Tylenol given per M.D. Prn order. Will follow up with hourly rounding.

## 2011-11-15 ENCOUNTER — Inpatient Hospital Stay (HOSPITAL_COMMUNITY): Payer: Medicare Other

## 2011-11-15 ENCOUNTER — Ambulatory Visit: Payer: Medicare Other | Admitting: Radiation Oncology

## 2011-11-15 DIAGNOSIS — K567 Ileus, unspecified: Secondary | ICD-10-CM | POA: Diagnosis not present

## 2011-11-15 DIAGNOSIS — K560 Paralytic ileus: Secondary | ICD-10-CM

## 2011-11-15 LAB — BASIC METABOLIC PANEL
BUN: 11 mg/dL (ref 6–23)
CO2: 19 mEq/L (ref 19–32)
Calcium: 8.3 mg/dL — ABNORMAL LOW (ref 8.4–10.5)
Creatinine, Ser: 1.18 mg/dL (ref 0.50–1.35)
GFR calc non Af Amer: 67 mL/min — ABNORMAL LOW (ref >90)
Glucose, Bld: 167 mg/dL — ABNORMAL HIGH (ref 70–99)
Glucose, Bld: 132 mg/dL — ABNORMAL HIGH (ref 70–99)
Potassium: 4.2 mEq/L (ref 3.5–5.1)

## 2011-11-15 LAB — URINE CULTURE: Culture: NO GROWTH

## 2011-11-15 LAB — CBC
MCH: 29.1 pg (ref 26.0–34.0)
MCV: 84 fL (ref 78.0–100.0)
Platelets: 333 10*3/uL (ref 150–400)
RBC: 3.88 MIL/uL — ABNORMAL LOW (ref 4.22–5.81)
RDW: 13.2 % (ref 11.5–15.5)
WBC: 11.1 10*3/uL — ABNORMAL HIGH (ref 4.0–10.5)

## 2011-11-15 LAB — GLUCOSE, CAPILLARY: Glucose-Capillary: 126 mg/dL — ABNORMAL HIGH (ref 70–99)

## 2011-11-15 MED ORDER — POTASSIUM CHLORIDE 10 MEQ/100ML IV SOLN
10.0000 meq | INTRAVENOUS | Status: AC
  Administered 2011-11-15 (×4): 10 meq via INTRAVENOUS
  Filled 2011-11-15: qty 100
  Filled 2011-11-15: qty 400

## 2011-11-15 MED ORDER — SIMETHICONE 80 MG PO CHEW
160.0000 mg | CHEWABLE_TABLET | Freq: Four times a day (QID) | ORAL | Status: DC | PRN
  Administered 2011-11-17: 160 mg via ORAL
  Filled 2011-11-15: qty 2

## 2011-11-15 MED ORDER — SODIUM CHLORIDE 0.9 % IV SOLN
INTRAVENOUS | Status: DC
  Administered 2011-11-15: 15:00:00 via INTRAVENOUS

## 2011-11-15 MED ORDER — METOPROLOL TARTRATE 1 MG/ML IV SOLN
5.0000 mg | Freq: Three times a day (TID) | INTRAVENOUS | Status: DC
  Administered 2011-11-15 – 2011-11-16 (×3): 5 mg via INTRAVENOUS
  Filled 2011-11-15 (×6): qty 5

## 2011-11-15 MED ORDER — VANCOMYCIN HCL IN DEXTROSE 1-5 GM/200ML-% IV SOLN
1000.0000 mg | Freq: Three times a day (TID) | INTRAVENOUS | Status: DC
  Administered 2011-11-15 – 2011-11-17 (×6): 1000 mg via INTRAVENOUS
  Filled 2011-11-15 (×7): qty 200

## 2011-11-15 NOTE — Progress Notes (Signed)
ANTIBIOTIC CONSULT NOTE  Pharmacy Consult for Zosyn/Vanc Indication: Sepsis, urinary  No Known Allergies  Patient Measurements: Height: 5\' 8"  (172.7 cm) Weight: 202 lb 9.6 oz (91.9 kg) IBW/kg (Calculated) : 68.4   Vital Signs: Temp: 98.7 F (37.1 C) (08/01 1049) Temp src: Oral (08/01 0800) BP: 140/68 mmHg (08/01 0900) Pulse Rate: 75  (08/01 1049) Intake/Output from previous day: 07/31 0701 - 08/01 0700 In: 2720.8 [P.O.:300; I.V.:2008.3; IV Piggyback:412.5] Out: 4475 [Urine:4475] Intake/Output from this shift: Total I/O In: -  Out: 330 [Urine:330]  Labs:  [facility] 11/15/11 0915 11/15/11 0315 11/14/11 2105 11/14/11 0913 11/14/11 0315  WBC -- 11.1* -- -- 8.5  HGB -- 11.3* -- -- 12.4*  PLT -- 333 -- -- 311  LABCREA -- -- -- -- --  CREATININE 1.18 -- 1.27 1.13 --   Estimated Creatinine Clearance: 74.2 ml/min (by C-G formula based on Cr of 1.18). 54 ml/mim/1.27m2 (normalized)   Microbiology: Recent Results (from the past 720 hour(s))  URINE CULTURE     Status: Normal   Collection Time   11/11/11  9:19 PM      Component Value Range Status Comment   Specimen Description URINE, CATHETERIZED   Final    Special Requests NONE   Final    Culture  Setup Time 11/12/2011 01:38   Final    Colony Count NO GROWTH   Final    Culture NO GROWTH   Final    Report Status 11/13/2011 FINAL   Final   CULTURE, BLOOD (ROUTINE X 2)     Status: Normal (Preliminary result)   Collection Time   11/11/11  9:46 PM      Component Value Range Status Comment   Specimen Description BLOOD RIGHT ANTECUBITAL   Final    Special Requests BOTTLES DRAWN AEROBIC ONLY 5CC   Final    Culture  Setup Time 11/12/2011 01:40   Final    Culture     Final    Value:        BLOOD CULTURE RECEIVED NO GROWTH TO DATE CULTURE WILL BE HELD FOR 5 DAYS BEFORE ISSUING A FINAL NEGATIVE REPORT   Report Status PENDING   Incomplete   CULTURE, BLOOD (ROUTINE X 2)     Status: Normal (Preliminary result)   Collection Time   11/11/11 10:00 PM      Component Value Range Status Comment   Specimen Description BLOOD RIGHT HAND   Final    Special Requests BOTTLES DRAWN AEROBIC ONLY 2CC   Final    Culture  Setup Time 11/12/2011 01:40   Final    Culture     Final    Value:        BLOOD CULTURE RECEIVED NO GROWTH TO DATE CULTURE WILL BE HELD FOR 5 DAYS BEFORE ISSUING A FINAL NEGATIVE REPORT   Report Status PENDING   Incomplete   MRSA PCR SCREENING     Status: Abnormal   Collection Time   11/11/11 11:26 PM      Component Value Range Status Comment   MRSA by PCR POSITIVE (*) NEGATIVE Final   CULTURE, BLOOD (ROUTINE X 2)     Status: Normal (Preliminary result)   Collection Time   11/13/11  9:40 AM      Component Value Range Status Comment   Specimen Description BLOOD LEFT ARM   Final    Special Requests BOTTLES DRAWN AEROBIC AND ANAEROBIC [facility] EACH   Final    Culture  Setup Time 11/13/2011 14:51  Final    Culture     Final    Value:        BLOOD CULTURE RECEIVED NO GROWTH TO DATE CULTURE WILL BE HELD FOR 5 DAYS BEFORE ISSUING A FINAL NEGATIVE REPORT   Report Status PENDING   Incomplete   CULTURE, BLOOD (ROUTINE X 2)     Status: Normal (Preliminary result)   Collection Time   11/13/11  9:41 AM      Component Value Range Status Comment   Specimen Description BLOOD LEFT ARM   Final    Special Requests BOTTLES DRAWN AEROBIC AND ANAEROBIC 5 CC EACH   Final    Culture  Setup Time 11/13/2011 14:51   Final    Culture     Final    Value:        BLOOD CULTURE RECEIVED NO GROWTH TO DATE CULTURE WILL BE HELD FOR 5 DAYS BEFORE ISSUING A FINAL NEGATIVE REPORT   Report Status PENDING   Incomplete     Medical History:   Medications:  Anti-infectives     Start     Dose/Rate Route Frequency Ordered Stop   11/13/11 1030   vancomycin (VANCOCIN) 750 mg in sodium chloride 0.9 % 150 mL IVPB        750 mg 150 mL/hr over 60 Minutes Intravenous Every 12 hours 11/13/11 0928     11/12/11 1400   piperacillin-tazobactam (ZOSYN) IVPB  3.375 g        3.375 g 12.5 mL/hr over 240 Minutes Intravenous Every 8 hours 11/12/11 1259     11/11/11 2200   piperacillin-tazobactam (ZOSYN) IVPB 2.25 g  Status:  Discontinued        2.25 g 100 mL/hr over 30 Minutes Intravenous 3 times per day 11/11/11 2142 11/12/11 1259         Assessment:  61 yo male on Day #5 Zosyn for severe sepsis, D#3 Vancomycin (added for fevers/chills).  Questioning urinary source, repeating urine culture  Renal function much improve, continues to have low grade fevers  Vancomycin trough low will increase dose   Goal of Therapy:  Vancomycin troughs 15-20 mcg/ml Zosyn per renal function   Plan:   Increase Vancomycin to 1 gram IV q8h   Continue Zosyn 3.375gm IV q8h (4hr extended infusions) Follow up renal function & cultures  Cynthya Yam, Loma Messing PharmD 11:04 AM 11/15/2011

## 2011-11-15 NOTE — Progress Notes (Signed)
[facility] PROGRESS NOTE  Cole Conrad ZOX:[id number] DOB: 11/10/1950 DOA: 11/11/2011 PCP: Georganna Skeans, MD  Assessment/Plan: Principal Problem:  *Acute renal failure Active Problems:  DM, UNCOMPLICATED, TYPE II, UNCONTROLLED  ASTHMA  DEGENERATIVE JOINT DISEASE, HIPS  DEGENERATIVE DISC DISEASE, LUMBOSACRAL SPINE  Prostate cancer  Hypertension  Hypoglycemia associated with diabetes  Anemia  Hyperkalemia  Hyponatremia  Urinary retention  Rash  Fever  Ileus 1. Severe sepsis Unknown etiology. Patient still with fever spikes with temp of 100.6 last night at 7pm, unknown etiology.- Continue IV fluid repletion. Precipitants would include most likely urinary source, given recent instrumentation of bladder and history of prostate cancer-will consider involving urology,. Blood cultures pending, urine cultures obtained on admission negative . Patient is hemodynamically stable currently but does have some low grade temperature elevations and spiked to 100.6 overnight-he hasn't had diarrhea x 3 days. Repeat urinalysis with negative nitrites, trace leukocytes with 3-6 WBCs. Acute abdominal series consistent with gaseous distention consistent with ileus. Hemodynamically he has improved. Tachycardia is likely related to some anxiety-although could be secondary to infectious state as well. Continue IV Zosyn and IV vancomycin. Follow. 2. Undifferentiated Lactic acidosis-multifactorial. Potentially secondary to Amaryl and metformin use +/- sepsis. Anion gap on admission was 28. Current anion gap is 11.  3. Acute renal insufficiency-multifactorial-secondary to acute renal failure from Metformin + Acute Urinary retention secondary to Vesicare. baseline creatinine is 1.04 08/31/2011 and on admission 11/12/2011 was 10.34. Resume sodium bicarbonate. Significant improvement in renal function creatinine currently at 1.18. ACE inhibitor was discontinued. Follow in the morning. 4. Hyponatremia-possibly  secondary to severe sepsis vs. acute kidney injury-patient with poor oral intake, only drinking. Sodium today is 130. Continue IVF. 5. Severe hypoglycemia-potentially secondary to #3, and altered pharmacokinetics of metformin/sulfonylurea-should fully return as kidney function results. Improved. Off D5 and D10. CBGs > 100. Follow. 6. Pain in lower abdomen/ileus-patient states he has some discomfort in the stomach and was started on Vesicare which was titrated upwards-this has a 3-5% risk of urinary tract infection and about a 1% risk of urinary retention and could be the etiology presenting illness. Likely etiologies ileus per acute abdominal series which was obtained. Patient with gaseous distention. Will make patient n.p.o. We'll try to keep patient's potassium greater than 4 and magnesium greater than 2. Patient is passing gas however no bowel movement. Conservative treatment and monitor. Serial abdominal films. If no significant improvement in the next 24-48 hours may consider a GI or surgical consult. 7. Hypertension-was on lisinopril at home. DC hydralazine. Place on IV Lopressor secondary to current ileus. 8. History of prostate cancer/Acute urinary retention-consulted urology 2 days ago per Dr Mahala Menghini. Patient started on flomax and foley catheter to be kept in for next 2 weeks with voiding trial as outpatient per Dr Pandora Leiter note after he spoke with urology. 9. Fever- unkown etiology. Repeat urinalysis is negative for nitrites. Acute abdominal series is consistent with an ileus. Blood cultures are pending. Continue empiric IV vancomycin and zosyn. Follow 10. Rash- Diffuse maculopapular rash. Improving.?? Etiology. Maybe hypersensitivity reaction. Follow.   Code Status: Full Family Communication: updated patient and daughter at bedside. Disposition Plan: Transfer to telemetry. Home when medically stable.   Brief narrative: 61 yr old male admitted 7/29 with severe sepsis, potentially 2/2 to  Urinary cause. He stented in potentially severe sepsis and was noted to be on multiple diabetic agents that can cause renal insufficiency. He has improved to the point that he was transferred out of the ICU to SDU status.  His ABX have been adjusted and he remains hemodynamically stable. I have consulted Urology 7/30 for recommendations and his family is aware of our thoughts.   Consultants:  Urology 7/30.per Dr Mahala Menghini, but no note seen.  Procedures: CT head 10/2011 = no evidence for acute intracranial abnormality     Antibiotics: Zosyn 7/28  Vancomycin started 7/30  Blood Cult x 2 pending  UC neg.   HPI/Subjective: Patient states passing a lot of gas. Patient with no bowel movement. Patient still with some abdominal pain. Patient denies any nausea or vomiting.  Objective: Filed Vitals:   11/15/11 0625 11/15/11 0700 11/15/11 0800 11/15/11 0900  BP: 158/90 170/94  140/68  Pulse:  88 87 83  Temp:   98.2 F (36.8 C)   TempSrc:   Oral   Resp:  23 17 23   Height:      Weight:      SpO2:  98% 99% 98%    Intake/Output Summary (Last 24 hours) at 11/15/11 1023 Last data filed at 11/15/11 0900  Gross per 24 hour  Intake 2323.33 ml  Output   4405 ml  Net -2081.67 ml    Exam: General: Alert pleasant African American male in no distress. Noted cyst over left eyebrow  Cardiovascular: Tachy S1-S2 no murmur rub or gallop regular rate rhythm  Respiratory: Clinically clear no tactile vocal resonance and fremitus  Abdomen: Soft, diffuse TTP, mild distension, no rebound no guarding no organomegaly no CVA tenderness  Skin fine petechial rash over upper torso and back improving-patient states that he has a history of eczema.  Patient has another rash which is vesicular papular over the upper back and torso and which slightly improved per patient since admit  Neuro motor grossly intact, sensory grossly intact, moving all 4 limbs       Data Reviewed: Basic Metabolic Panel:  Lab  11/15/11 0915 11/14/11 2105 11/14/11 0913 11/13/11 1955 11/13/11 0945  NA 130* 127* 128* 130* 127*  K 3.8 3.8 3.9 4.0 4.3  CL 98 98 99 97 91*  CO2 19 19 18* 22 22  GLUCOSE 167* 207* 246* 178* 261*  BUN 10 13 11 11 13   CREATININE 1.18 1.27 1.13 1.16 1.41*  CALCIUM 8.3* 8.2* 8.4 8.6 8.5  MG -- -- -- -- --  PHOS -- -- -- -- --   Liver Function Tests:  Lab 11/12/11 0445 11/11/11 2130  AST 24 23  ALT 40 40  ALKPHOS 64 69  BILITOT 0.2* 0.1*  PROT 6.7 7.1  ALBUMIN 2.8* 3.0*   No results found for this basename: LIPASE:5,AMYLASE:5 in the last 168 hours No results found for this basename: AMMONIA:5 in the last 168 hours CBC:  Lab 11/15/11 0315 11/14/11 0315 11/12/11 0445 11/11/11 2005  WBC 11.1* 8.5 6.1 7.1  NEUTROABS -- 6.6 -- 5.7  HGB 11.3* 12.4* 11.3* 12.1*  HCT 32.6* 35.5* 31.5* 35.1*  MCV 84.0 84.5 83.1 86.2  PLT 333 311 303 309   Cardiac Enzymes: No results found for this basename: CKTOTAL:5,CKMB:5,CKMBINDEX:5,TROPONINI:5 in the last 168 hours BNP (last 3 results) No results found for this basename: PROBNP:3 in the last 8760 hours CBG:  Lab 11/15/11 0747 11/14/11 2220 11/14/11 1526 11/14/11 1200 11/14/11 0817  GLUCAP 170* 160* 197* 259* 177*    Recent Results (from the past 240 hour(s))  URINE CULTURE     Status: Normal   Collection Time   11/11/11  9:19 PM      Component Value Range Status Comment  Specimen Description URINE, CATHETERIZED   Final    Special Requests NONE   Final    Culture  Setup Time 11/12/2011 01:38   Final    Colony Count NO GROWTH   Final    Culture NO GROWTH   Final    Report Status 11/13/2011 FINAL   Final   CULTURE, BLOOD (ROUTINE X 2)     Status: Normal (Preliminary result)   Collection Time   11/11/11  9:46 PM      Component Value Range Status Comment   Specimen Description BLOOD RIGHT ANTECUBITAL   Final    Special Requests BOTTLES DRAWN AEROBIC ONLY 5CC   Final    Culture  Setup Time 11/12/2011 01:40   Final    Culture     Final      Value:        BLOOD CULTURE RECEIVED NO GROWTH TO DATE CULTURE WILL BE HELD FOR 5 DAYS BEFORE ISSUING A FINAL NEGATIVE REPORT   Report Status PENDING   Incomplete   CULTURE, BLOOD (ROUTINE X 2)     Status: Normal (Preliminary result)   Collection Time   11/11/11 10:00 PM      Component Value Range Status Comment   Specimen Description BLOOD RIGHT HAND   Final    Special Requests BOTTLES DRAWN AEROBIC ONLY 2CC   Final    Culture  Setup Time 11/12/2011 01:40   Final    Culture     Final    Value:        BLOOD CULTURE RECEIVED NO GROWTH TO DATE CULTURE WILL BE HELD FOR 5 DAYS BEFORE ISSUING A FINAL NEGATIVE REPORT   Report Status PENDING   Incomplete   MRSA PCR SCREENING     Status: Abnormal   Collection Time   11/11/11 11:26 PM      Component Value Range Status Comment   MRSA by PCR POSITIVE (*) NEGATIVE Final   CULTURE, BLOOD (ROUTINE X 2)     Status: Normal (Preliminary result)   Collection Time   11/13/11  9:40 AM      Component Value Range Status Comment   Specimen Description BLOOD LEFT ARM   Final    Special Requests BOTTLES DRAWN AEROBIC AND ANAEROBIC 5CC EACH   Final    Culture  Setup Time 11/13/2011 14:51   Final    Culture     Final    Value:        BLOOD CULTURE RECEIVED NO GROWTH TO DATE CULTURE WILL BE HELD FOR 5 DAYS BEFORE ISSUING A FINAL NEGATIVE REPORT   Report Status PENDING   Incomplete   CULTURE, BLOOD (ROUTINE X 2)     Status: Normal (Preliminary result)   Collection Time   11/13/11  9:41 AM      Component Value Range Status Comment   Specimen Description BLOOD LEFT ARM   Final    Special Requests BOTTLES DRAWN AEROBIC AND ANAEROBIC 5 CC EACH   Final    Culture  Setup Time 11/13/2011 14:51   Final    Culture     Final    Value:        BLOOD CULTURE RECEIVED NO GROWTH TO DATE CULTURE WILL BE HELD FOR 5 DAYS BEFORE ISSUING A FINAL NEGATIVE REPORT   Report Status PENDING   Incomplete      Studies: Ct Head Wo Contrast  11/11/2011  *RADIOLOGY REPORT*  Clinical  Data: Dizziness.  Hypoglycemia.  Headache.  Recent  seed implants for prostate cancer on 10/13/2011.  CT HEAD WITHOUT CONTRAST  Technique:  Contiguous axial images were obtained from the base of the skull through the vertex without contrast.  Comparison: 06/08/2008  Findings: There is no intra or extra-axial fluid collection or mass lesion.  The basilar cisterns and ventricles have a normal appearance.  There is no CT evidence for acute infarction or hemorrhage.  Bone windows show no acute findings. 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  IMPRESSION: No evidence for acute intracranial abnormality.  Original Report Authenticated By: Patterson Hammersmith, M.D.    Scheduled Meds:    . aspirin EC  81 mg Oral Daily  . Chlorhexidine Gluconate Cloth  6 each Topical Daily  . cyclobenzaprine  10 mg Oral TID  . enoxaparin (LOVENOX) injection  40 mg Subcutaneous Q24H  . feeding supplement  237 mL Oral BID BM  . insulin aspart  0-9 Units Subcutaneous TID WC  . metoprolol  5 mg Intravenous Q8H  . mupirocin ointment  1 application Nasal BID  . piperacillin-tazobactam (ZOSYN)  IV  3.375 g Intravenous Q8H  . simethicone  160 mg Oral QID  . sodium bicarbonate  650 mg Oral BID  . sodium chloride  3 mL Intravenous Q12H  . Tamsulosin HCl  0.4 mg Oral QPC supper  . vancomycin  750 mg Intravenous Q12H  . DISCONTD: hydrALAZINE  25 mg Oral Q6H   Continuous Infusions:    . sodium chloride    . DISCONTD: sodium chloride 125 mL/hr at 11/14/11 1017    Principal Problem:  *Acute renal failure Active Problems:  DM, UNCOMPLICATED, TYPE II, UNCONTROLLED  ASTHMA  DEGENERATIVE JOINT DISEASE, HIPS  DEGENERATIVE DISC DISEASE, LUMBOSACRAL SPINE  Prostate cancer  Hypertension  Hypoglycemia associated with diabetes  Anemia  Hyperkalemia  Hyponatremia  Urinary retention  Rash  Fever  Ileus    Time spent: 45 mins    [facility]  [facility] Pager [phone]. If 8PM-8AM, please  contact night-coverage at [email or web address], password [facility] [address] 11/15/2011, 10:23 AM  LOS: 4 days

## 2011-11-16 ENCOUNTER — Inpatient Hospital Stay (HOSPITAL_COMMUNITY): Payer: Medicare Other

## 2011-11-16 LAB — CREATININE, URINE, RANDOM: Creatinine, Urine: 117.4 mg/dL

## 2011-11-16 LAB — CBC WITH DIFFERENTIAL/PLATELET
Basophils Relative: 1 % (ref 0–1)
Eosinophils Relative: 4 % (ref 0–5)
HCT: 36.1 % — ABNORMAL LOW (ref 39.0–52.0)
Hemoglobin: 12.3 g/dL — ABNORMAL LOW (ref 13.0–17.0)
Lymphs Abs: 1.5 10*3/uL (ref 0.7–4.0)
MCV: 84.7 fL (ref 78.0–100.0)
Monocytes Relative: 10 % (ref 3–12)
Neutro Abs: 6.6 10*3/uL (ref 1.7–7.7)
RBC: 4.26 MIL/uL (ref 4.22–5.81)
RDW: 13.1 % (ref 11.5–15.5)
WBC: 9.6 10*3/uL (ref 4.0–10.5)

## 2011-11-16 LAB — BASIC METABOLIC PANEL
CO2: 19 mEq/L (ref 19–32)
CO2: 18 mEq/L — ABNORMAL LOW (ref 19–32)
Calcium: 8.6 mg/dL (ref 8.4–10.5)
Chloride: 102 mEq/L (ref 96–112)
Creatinine, Ser: 1.11 mg/dL (ref 0.50–1.35)
GFR calc Af Amer: >90 mL/min (ref >90)
Potassium: 3.7 mEq/L (ref 3.5–5.1)
Sodium: 133 mEq/L — ABNORMAL LOW (ref 135–145)

## 2011-11-16 LAB — OSMOLALITY: Osmolality: 278 mOsm/kg (ref 275–300)

## 2011-11-16 LAB — GLUCOSE, CAPILLARY
Glucose-Capillary: 122 mg/dL — ABNORMAL HIGH (ref 70–99)
Glucose-Capillary: 185 mg/dL — ABNORMAL HIGH (ref 70–99)

## 2011-11-16 LAB — SODIUM, URINE, RANDOM: Sodium, Ur: 81 mEq/L

## 2011-11-16 LAB — OSMOLALITY, URINE: Osmolality, Ur: 397 mOsm/kg (ref 390–1090)

## 2011-11-16 LAB — VANCOMYCIN, TROUGH: Vancomycin Tr: 16.2 ug/mL (ref 10.0–20.0)

## 2011-11-16 MED ORDER — METOPROLOL TARTRATE 1 MG/ML IV SOLN
10.0000 mg | Freq: Three times a day (TID) | INTRAVENOUS | Status: DC
  Administered 2011-11-16 – 2011-11-17 (×4): 10 mg via INTRAVENOUS
  Filled 2011-11-16 (×5): qty 10

## 2011-11-16 MED ORDER — POTASSIUM CHLORIDE 10 MEQ/100ML IV SOLN
10.0000 meq | INTRAVENOUS | Status: AC
  Administered 2011-11-16 (×4): 10 meq via INTRAVENOUS
  Filled 2011-11-16 (×4): qty 100

## 2011-11-16 MED ORDER — VITAMINS A & D EX OINT
TOPICAL_OINTMENT | CUTANEOUS | Status: AC
  Administered 2011-11-16: 12:00:00
  Filled 2011-11-16: qty 5

## 2011-11-16 MED ORDER — MAGNESIUM SULFATE 50 % IJ SOLN
3.0000 g | Freq: Once | INTRAMUSCULAR | Status: AC
  Administered 2011-11-16: 3 g via INTRAVENOUS
  Filled 2011-11-16: qty 6

## 2011-11-16 MED ORDER — POTASSIUM CHLORIDE IN NACL 40-0.9 MEQ/L-% IV SOLN
INTRAVENOUS | Status: DC
  Administered 2011-11-16 (×2): via INTRAVENOUS
  Filled 2011-11-16 (×4): qty 1000

## 2011-11-16 NOTE — Progress Notes (Signed)
[facility] PROGRESS NOTE  Cole Conrad ZOX:[id number] DOB: Nov 04, 1950 DOA: 11/11/2011 PCP: Georganna Skeans, MD  Assessment/Plan: Principal Problem:  *Acute renal failure Active Problems:  DM, UNCOMPLICATED, TYPE II, UNCONTROLLED  ASTHMA  DEGENERATIVE JOINT DISEASE, HIPS  DEGENERATIVE DISC DISEASE, LUMBOSACRAL SPINE  Prostate cancer  Hypertension  Hypoglycemia associated with diabetes  Anemia  Hyperkalemia  Hyponatremia  Urinary retention  Rash  Fever  Ileus 1. Severe sepsis Unknown etiology. Patient afebribe x 24 hours. Patient had with fever spikes with temp of 100.6 2 nights ago night at 7pm, unknown etiology.- Continue IV fluid repletion. Precipitants would include most likely urinary source, given recent instrumentation of bladder and history of prostate cancer-will consider involving urology,. Blood cultures pending, urine cultures obtained on admission negative . Patient is hemodynamically stable currently but does have some low grade temperature elevations and spiked to 100.6 2 days ago.-he hasn't had diarrhea x 3 days. Repeat urinalysis with negative nitrites, trace leukocytes with 3-6 WBCs. Acute abdominal series consistent with gaseous distention consistent with ileus. Hemodynamically he has improved. Tachycardia is likely related to some anxiety-although could be secondary to infectious state as well. Continue IV Zosyn and IV vancomycin. If cultures remain negative and patient remains afebrile tomorrow will d/c IV vancomycin and may consider narrowwing antibiotics.Follow. 2. Undifferentiated Lactic acidosis-multifactorial. Potentially secondary to Amaryl and metformin use +/- sepsis. Anion gap on admission was 28. Current anion gap is 11.  3. Acute renal insufficiency-multifactorial-secondary to acute renal failure from Metformin + Acute Urinary retention secondary to Vesicare. baseline creatinine is 1.04 08/31/2011 and on admission 11/12/2011 was 10.34. Resume sodium  bicarbonate. Significant improvement in renal function creatinine currently at 1.11. ACE inhibitor was discontinued. Follow in the morning. 4. Hyponatremia-possibly secondary to severe sepsis vs. acute kidney injury-patient with poor oral intake, only drinking. Sodium today is 133. Continue IVF. 5. Severe hypoglycemia-potentially secondary to #3, and altered pharmacokinetics of metformin/sulfonylurea-should fully return as kidney function results. Improved. Off D5 and D10. CBGs > 100. Follow.stable. 6. Pain in lower abdomen/ileus-patient states he has some discomfort in the stomach and was started on Vesicare which was titrated upwards-this has a 3-5% risk of urinary tract infection and about a 1% risk of urinary retention and could be the etiology presenting illness. Likely etiologies ileus per acute abdominal series which was obtained. Patient with gaseous distention/ileus.  Repeat xrays with no significant change. Continue bowel rest.Will try to keep patient's potassium greater than 4.5 and magnesium greater than 2. Patient is passing gas however no bowel movement. Mobilize.Conservative treatment and monitor. Serial abdominal films. If no significant improvement in the next 24-48 hours may consider a GI or surgical consult. 7. Hypertension-was on lisinopril at home. Increase IV Lopressor to 10mg  q8 hours. 8. History of prostate cancer/Acute urinary retention-consulted urology 3 days ago per Dr Mahala Menghini. Patient started on flomax and foley catheter to be kept in for next 2 weeks with voiding trial as outpatient per Dr Pandora Leiter note after he spoke with urology. 9. Fever- unkown etiology. Afebrile x last 24 hours. Repeat urinalysis is negative for nitrites. Acute abdominal series is consistent with an ileus. Blood cultures are pending with no growth to date. Continue empiric IV vancomycin and zosyn. If cultures remain negative tomorrow will d/c vancomycin and narrow antibiotics. Follow 10. Rash- Diffuse  maculopapular rash. Improving.?? Etiology. Maybe hypersensitivity reaction. Follow.   Code Status: Full Family Communication: updated patient and daughter at bedside. Disposition Plan: Transfer to telemetry. Home when medically stable.   Brief narrative: 61 yr  old male admitted 7/29 with severe sepsis, potentially 2/2 to Urinary cause. He stented in potentially severe sepsis and was noted to be on multiple diabetic agents that can cause renal insufficiency. He has improved to the point that he was transferred out of the ICU to SDU status. His ABX have been adjusted and he remains hemodynamically stable. I have consulted Urology 7/30 for recommendations and his family is aware of our thoughts.   Consultants:  Urology 7/30.per Dr Mahala Menghini, but no note seen.  Procedures: CT head 10/2011 = no evidence for acute intracranial abnormality     Antibiotics: Zosyn 7/28  Vancomycin started 7/30  Blood Cult x 2 pending  UC neg.   HPI/Subjective: Patient states passing a lot of gas. Patient with no bowel movement. Patient still with some abdominal pain. Patient denies any nausea or vomiting. Patient wants to eat.  Objective: Filed Vitals:   11/15/11 2212 11/16/11 0558 11/16/11 1354 11/16/11 1400  BP: 156/94 166/88 133/64 123/76  Pulse: 71 71 72 70  Temp: 98.1 F (36.7 C) 98.6 F (37 C)  98 F (36.7 C)  TempSrc: Oral Oral  Oral  Resp: 16 16 16 16   Height:      Weight:      SpO2: 99% 100%  97%    Intake/Output Summary (Last 24 hours) at 11/16/11 1656 Last data filed at 11/16/11 1300  Gross per 24 hour  Intake   2350 ml  Output   4750 ml  Net  -2400 ml    Exam: General: Alert pleasant African American male in no distress. Noted cyst over left eyebrow  Cardiovascular: RRR, no murmur rub or gallop regular rate rhythm  Respiratory: Clinically clear no tactile vocal resonance and fremitus  Abdomen: Soft, diffuse TTP, mild distension, no rebound no guarding no organomegaly no CVA  tenderness  Skin fine petechial rash over upper torso and back improving-patient states that he has a history of eczema.  Patient has another rash which is vesicular papular over the upper back and torso and which has improved per patient since admit  Neuro motor grossly intact, sensory grossly intact, moving all 4 limbs       Data Reviewed: Basic Metabolic Panel:  Lab 11/16/11 4782 11/15/11 1632 11/15/11 0915 11/14/11 2105 11/14/11 0913  NA 133* 133* 130* 127* 128*  K 3.7 4.2 3.8 3.8 3.9  CL 102 101 98 98 99  CO2 19 20 19 19  18*  GLUCOSE 149* 132* 167* 207* 246*  BUN 11 11 10 13 11   CREATININE 1.11 1.16 1.18 1.27 1.13  CALCIUM 9.0 8.5 8.3* 8.2* 8.4  MG -- -- 1.7 -- --  PHOS -- -- -- -- --   Liver Function Tests:  Lab 11/12/11 0445 11/11/11 2130  AST 24 23  ALT 40 40  ALKPHOS 64 69  BILITOT 0.2* 0.1*  PROT 6.7 7.1  ALBUMIN 2.8* 3.0*   No results found for this basename: LIPASE:5,AMYLASE:5 in the last 168 hours No results found for this basename: AMMONIA:5 in the last 168 hours CBC:  Lab 11/16/11 0415 11/15/11 0315 11/14/11 0315 11/12/11 0445 11/11/11 2005  WBC 9.6 11.1* 8.5 6.1 7.1  NEUTROABS 6.6 -- 6.6 -- 5.7  HGB 12.3* 11.3* 12.4* 11.3* 12.1*  HCT 36.1* 32.6* 35.5* 31.5* 35.1*  MCV 84.7 84.0 84.5 83.1 86.2  PLT 368 333 311 303 309   Cardiac Enzymes: No results found for this basename: CKTOTAL:5,CKMB:5,CKMBINDEX:5,TROPONINI:5 in the last 168 hours BNP (last 3 results) No results  found for this basename: PROBNP:3 in the last 8760 hours CBG:  Lab 11/15/11 2209 11/15/11 1730 11/15/11 1112 11/15/11 0747 11/14/11 2220  GLUCAP 126* 139* 151* 170* 160*    Recent Results (from the past 240 hour(s))  URINE CULTURE     Status: Normal   Collection Time   11/11/11  9:19 PM      Component Value Range Status Comment   Specimen Description URINE, CATHETERIZED   Final    Special Requests NONE   Final    Culture  Setup Time 11/12/2011 01:38   Final    Colony Count NO  GROWTH   Final    Culture NO GROWTH   Final    Report Status 11/13/2011 FINAL   Final   CULTURE, BLOOD (ROUTINE X 2)     Status: Normal (Preliminary result)   Collection Time   11/11/11  9:46 PM      Component Value Range Status Comment   Specimen Description BLOOD RIGHT ANTECUBITAL   Final    Special Requests BOTTLES DRAWN AEROBIC ONLY 5CC   Final    Culture  Setup Time 11/12/2011 01:40   Final    Culture     Final    Value:        BLOOD CULTURE RECEIVED NO GROWTH TO DATE CULTURE WILL BE HELD FOR 5 DAYS BEFORE ISSUING A FINAL NEGATIVE REPORT   Report Status PENDING   Incomplete   CULTURE, BLOOD (ROUTINE X 2)     Status: Normal (Preliminary result)   Collection Time   11/11/11 10:00 PM      Component Value Range Status Comment   Specimen Description BLOOD RIGHT HAND   Final    Special Requests BOTTLES DRAWN AEROBIC ONLY 2CC   Final    Culture  Setup Time 11/12/2011 01:40   Final    Culture     Final    Value:        BLOOD CULTURE RECEIVED NO GROWTH TO DATE CULTURE WILL BE HELD FOR 5 DAYS BEFORE ISSUING A FINAL NEGATIVE REPORT   Report Status PENDING   Incomplete   MRSA PCR SCREENING     Status: Abnormal   Collection Time   11/11/11 11:26 PM      Component Value Range Status Comment   MRSA by PCR POSITIVE (*) NEGATIVE Final   CULTURE, BLOOD (ROUTINE X 2)     Status: Normal (Preliminary result)   Collection Time   11/13/11  9:40 AM      Component Value Range Status Comment   Specimen Description BLOOD LEFT ARM   Final    Special Requests BOTTLES DRAWN AEROBIC AND ANAEROBIC 5CC EACH   Final    Culture  Setup Time 11/13/2011 14:51   Final    Culture     Final    Value:        BLOOD CULTURE RECEIVED NO GROWTH TO DATE CULTURE WILL BE HELD FOR 5 DAYS BEFORE ISSUING A FINAL NEGATIVE REPORT   Report Status PENDING   Incomplete   CULTURE, BLOOD (ROUTINE X 2)     Status: Normal (Preliminary result)   Collection Time   11/13/11  9:41 AM      Component Value Range Status Comment   Specimen  Description BLOOD LEFT ARM   Final    Special Requests BOTTLES DRAWN AEROBIC AND ANAEROBIC 5 CC EACH   Final    Culture  Setup Time 11/13/2011 14:51   Final  Culture     Final    Value:        BLOOD CULTURE RECEIVED NO GROWTH TO DATE CULTURE WILL BE HELD FOR 5 DAYS BEFORE ISSUING A FINAL NEGATIVE REPORT   Report Status PENDING   Incomplete   URINE CULTURE     Status: Normal   Collection Time   11/14/11  9:56 AM      Component Value Range Status Comment   Specimen Description URINE, RANDOM   Final    Special Requests NONE   Final    Culture  Setup Time 11/14/2011 16:54   Final    Colony Count NO GROWTH   Final    Culture NO GROWTH   Final    Report Status 11/15/2011 FINAL   Final      Studies: Ct Head Wo Contrast  11/11/2011  *RADIOLOGY REPORT*  Clinical Data: Dizziness.  Hypoglycemia.  Headache.  Recent seed implants for prostate cancer on 10/13/2011.  CT HEAD WITHOUT CONTRAST  Technique:  Contiguous axial images were obtained from the base of the skull through the vertex without contrast.  Comparison: 06/08/2008  Findings: There is no intra or extra-axial fluid collection or mass lesion.  The basilar cisterns and ventricles have a normal appearance.  There is no CT evidence for acute infarction or hemorrhage.  Bone windows show no acute findings. 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  IMPRESSION: No evidence for acute intracranial abnormality.  Original Report Authenticated By: Patterson Hammersmith, M.D.    Scheduled Meds:    . aspirin EC  81 mg Oral Daily  . Chlorhexidine Gluconate Cloth  6 each Topical Daily  . cyclobenzaprine  10 mg Oral TID  . enoxaparin (LOVENOX) injection  40 mg Subcutaneous Q24H  . insulin aspart  0-9 Units Subcutaneous TID WC  . magnesium sulfate 1 - 4 g bolus IVPB  3 g Intravenous Once  . metoprolol  10 mg Intravenous Q8H  . mupirocin ointment  1 application Nasal BID  . piperacillin-tazobactam (ZOSYN)  IV  3.375 g Intravenous Q8H  .  potassium chloride  10 mEq Intravenous Q1 Hr x 4  . potassium chloride  10 mEq Intravenous Q1 Hr x 4  . simethicone  160 mg Oral QID  . sodium bicarbonate  650 mg Oral BID  . sodium chloride  3 mL Intravenous Q12H  . Tamsulosin HCl  0.4 mg Oral QPC supper  . vancomycin  1,000 mg Intravenous Q8H  . vitamin A & D      . DISCONTD: metoprolol  5 mg Intravenous Q8H   Continuous Infusions:    . 0.9 % NaCl with KCl 40 mEq / L 125 mL/hr at 11/16/11 1201  . DISCONTD: sodium chloride 125 mL/hr at 11/15/11 1445    Principal Problem:  *Acute renal failure Active Problems:  DM, UNCOMPLICATED, TYPE II, UNCONTROLLED  ASTHMA  DEGENERATIVE JOINT DISEASE, HIPS  DEGENERATIVE DISC DISEASE, LUMBOSACRAL SPINE  Prostate cancer  Hypertension  Hypoglycemia associated with diabetes  Anemia  Hyperkalemia  Hyponatremia  Urinary retention  Rash  Fever  Ileus    Time spent: 35 mins    [facility]  [facility] Pager [phone]. If 8PM-8AM, please contact night-coverage at [email or web address], password [facility] 11/16/2011, 4:56 PM  LOS: 5 days

## 2011-11-16 NOTE — Social Work (Signed)
CSW met with patient per patient request.  Patient was interested in knowing if he could qualify for Medicaid.  CSW shared with patient that Medicaid is a services based on age/disability and income.  CSW encouraged patient to apply for services with the county DSS in order for them to further assess his information and make determination. Patient was family with their location and identified that he would go forward and apply.  No further CSW needs. Beverly Sessions MSW, LCSW [phone]

## 2011-11-16 NOTE — Progress Notes (Signed)
ANTIBIOTIC CONSULT NOTE - FOLLOW UP  Pharmacy Consult for Vanc, Zosyn Indication: sepsis - unknown etiology  No Known Allergies  Patient Measurements: Height: 5\' 8"  (172.7 cm) Weight: 197 lb 8.5 oz (89.6 kg) IBW/kg (Calculated) : 68.4   Vital Signs: Temp: 98 F (36.7 C) (08/02 1400) Temp src: Oral (08/02 1400) BP: 123/76 mmHg (08/02 1400) Pulse Rate: 70  (08/02 1400) Intake/Output from previous day: 08/01 0701 - 08/02 0700 In: 2825 [I.V.:1925; IV Piggyback:900] Out: 4405 [Urine:4405] Intake/Output from this shift: Total I/O In: 947.9 [I.V.:247.9; IV Piggyback:700] Out: 850 [Urine:850]  Labs:  Basename 11/16/11 0415 11/15/11 1632 11/15/11 1050 11/15/11 0915 11/15/11 0315 11/14/11 0315  WBC 9.6 -- -- -- 11.1* 8.5  HGB 12.3* -- -- -- 11.3* 12.4*  PLT 368 -- -- -- 333 311  LABCREA -- -- 117.4 -- -- --  CREATININE 1.11 1.16 -- 1.18 -- --   Estimated Creatinine Clearance: 77.9 ml/min (by C-G formula based on Cr of 1.11).  Basename 11/16/11 1751 11/15/11 0915  VANCOTROUGH 16.2 5.7*  VANCOPEAK -- --  Drue Dun -- --  GENTTROUGH -- --  GENTPEAK -- --  GENTRANDOM -- --  TOBRATROUGH -- --  TOBRAPEAK -- --  TOBRARND -- --  AMIKACINPEAK -- --  AMIKACINTROU -- --  AMIKACIN -- --    Assessment:  36 YOM on Day34 of Vanc, Zosyn for sepsis of uknown etiology (likely urinary but cannot r/o other sources).    AFebrile, WBC wnl, Scr now improved to wnl  Blood cxs NGTD, negative urine cultures  Vancomycin trough was low on 8/1 and dose increased to 1gm IV q8h.  Vancomycin trough today therapeutic at 16.2  Per MD note, if cultures remain negative and patient remains afebrile tomorrow will d/c IV vancomycin and may consider narrowwing antibiotics  Goal of Therapy:  Vancomycin trough level 15-20 mcg/ml  Plan:   Continue Vancomycin 1000 mg IV q8h  Continue Zosyn 3.375 mg IV q8h  Pharmacy will f/u   Geoffry Paradise Thi 11/16/2011,6:49 PM

## 2011-11-17 ENCOUNTER — Inpatient Hospital Stay (HOSPITAL_COMMUNITY): Payer: Medicare Other

## 2011-11-17 DIAGNOSIS — E871 Hypo-osmolality and hyponatremia: Secondary | ICD-10-CM

## 2011-11-17 DIAGNOSIS — E1169 Type 2 diabetes mellitus with other specified complication: Secondary | ICD-10-CM

## 2011-11-17 LAB — GLUCOSE, CAPILLARY
Glucose-Capillary: 161 mg/dL — ABNORMAL HIGH (ref 70–99)
Glucose-Capillary: 153 mg/dL — ABNORMAL HIGH (ref 70–99)

## 2011-11-17 LAB — BASIC METABOLIC PANEL
CO2: 18 mEq/L — ABNORMAL LOW (ref 19–32)
Calcium: 8.5 mg/dL (ref 8.4–10.5)
Creatinine, Ser: 0.96 mg/dL (ref 0.50–1.35)
GFR calc Af Amer: >90 mL/min (ref >90)
GFR calc non Af Amer: 89 mL/min — ABNORMAL LOW (ref >90)
Sodium: 136 mEq/L (ref 135–145)

## 2011-11-17 LAB — MAGNESIUM: Magnesium: 2 mg/dL (ref 1.5–2.5)

## 2011-11-17 MED ORDER — LEVOFLOXACIN IN D5W 750 MG/150ML IV SOLN
750.0000 mg | INTRAVENOUS | Status: DC
  Filled 2011-11-17: qty 150

## 2011-11-17 MED ORDER — SODIUM POLYSTYRENE SULFONATE 15 GM/60ML PO SUSP
15.0000 g | Freq: Once | ORAL | Status: AC
  Administered 2011-11-17: 15 g via RECTAL
  Filled 2011-11-17: qty 60

## 2011-11-17 MED ORDER — METOPROLOL TARTRATE 25 MG PO TABS: 25.0000 mg | ORAL_TABLET | Freq: Two times a day (BID) | ORAL | Status: DC

## 2011-11-17 MED ORDER — METOPROLOL TARTRATE 50 MG PO TABS
50.0000 mg | ORAL_TABLET | Freq: Two times a day (BID) | ORAL | Status: DC
  Administered 2011-11-17 – 2011-11-19 (×4): 50 mg via ORAL
  Filled 2011-11-17 (×5): qty 1

## 2011-11-17 MED ORDER — SODIUM POLYSTYRENE SULFONATE 15 GM/60ML PO SUSP
15.0000 g | Freq: Once | ORAL | Status: DC
  Filled 2011-11-17: qty 60

## 2011-11-17 MED ORDER — LEVOFLOXACIN 750 MG PO TABS
750.0000 mg | ORAL_TABLET | Freq: Every day | ORAL | Status: DC
  Administered 2011-11-17 – 2011-11-19 (×3): 750 mg via ORAL
  Filled 2011-11-17 (×3): qty 1

## 2011-11-17 MED ORDER — SODIUM CHLORIDE 0.9 % IV SOLN
INTRAVENOUS | Status: DC
  Administered 2011-11-17 (×2): via INTRAVENOUS

## 2011-11-17 NOTE — Progress Notes (Addendum)
ANTIBIOTIC CONSULT NOTE - FOLLOW UP  Pharmacy Consult for Levaquin Indication: sepsis - unknown etiology  No Known Allergies  Patient Measurements: Height: 5\' 8"  (172.7 cm) Weight: 197 lb 8.5 oz (89.6 kg) IBW/kg (Calculated) : 68.4   Vital Signs: Temp: 98.4 F (36.9 C) (08/03 0615) Temp src: Oral (08/03 0615) BP: 134/87 mmHg (08/03 0615) Pulse Rate: 58  (08/03 0615) Intake/Output from previous day: 08/02 0701 - 08/03 0700 In: 22680.4 [I.V.:21980.4; IV Piggyback:700] Out: 2250 [Urine:2250] Intake/Output from this shift:    Labs:  Basename 11/17/11 0530 11/16/11 1754 11/16/11 0415 11/15/11 1050 11/15/11 0315  WBC -- -- 9.6 -- 11.1*  HGB -- -- 12.3* -- 11.3*  PLT -- -- 368 -- 333  LABCREA -- -- -- 117.4 --  CREATININE 0.96 0.99 1.11 -- --   Estimated Creatinine Clearance: 90.1 ml/min (by C-G formula based on Cr of 0.96).  Basename 11/16/11 1751 11/15/11 0915  VANCOTROUGH 16.2 5.7*  VANCOPEAK -- --  Drue Dun -- --  GENTTROUGH -- --  GENTPEAK -- --  GENTRANDOM -- --  TOBRATROUGH -- --  TOBRAPEAK -- --  TOBRARND -- --  AMIKACINPEAK -- --  AMIKACINTROU -- --  AMIKACIN -- --    Assessment:  61yo M with sepsis of uknown etiology (likely urinary but cannot r/o other sources).  AFebrile, WBC wnl, Scr now wnl.  Blood cxs NGTD, negative urine cultures  Abx narrowed to Levaquin.  Plan:   Levaquin 750mg  PO q24h.  Pharmacy will sign-off.  Charolotte Eke, PharmD, pager [phone]. 11/17/2011,11:48 AM.

## 2011-11-17 NOTE — Plan of Care (Signed)
Problem: Phase I Progression Outcomes Goal: Voiding-avoid urinary catheter unless indicated Outcome: Not Met (add Reason) To stay in 2 weeks

## 2011-11-17 NOTE — Progress Notes (Signed)
[facility] PROGRESS NOTE  Rodolph Hagemann EAV:[id number] DOB: 07/02/1950 DOA: 11/11/2011 PCP: Georganna Skeans, MD  Assessment/Plan: Principal Problem:  *Acute renal failure Active Problems:  DM, UNCOMPLICATED, TYPE II, UNCONTROLLED  ASTHMA  DEGENERATIVE JOINT DISEASE, HIPS  DEGENERATIVE DISC DISEASE, LUMBOSACRAL SPINE  Prostate cancer  Hypertension  Hypoglycemia associated with diabetes  Anemia  Hyperkalemia  Hyponatremia  Urinary retention  Rash  Fever  Ileus Tachycardia  1. Severe sepsis Unknown etiology. Patient afebribe x 24 hours. Patient had with fever spikes with temp of 100.6 3 nights ago night at 7pm, unknown etiology.- Continue IV fluid repletion. Precipitants would include most likely urinary source, given recent instrumentation of bladder and history of prostate cancer-will consider involving urology,. Blood cultures pending, urine cultures obtained on admission negative . Denies any diarrhea currently. Repeat urinalysis with negative nitrites, trace leukocytes with 3-6 WBCs. Acute abdominal series consistent with gaseous distention consistent with ileus. Hemodynamically he has improved.  - d/c Vancomycin and Zosyn and place on levaquin for suspected infection originating from urinary tract due to leukocytes found in urinalysis on u/a collected in 11/14/11.  2.  Tachycardia: Resolved and was likely due to anxiety vs infectious etiology.   3. Undifferentiated Lactic acidosis-multifactorial. Potentially secondary to Amaryl and metformin use +/- sepsis. Anion gap on admission was 28. Current anion gap is 10.  4. Acute renal insufficiency-multifactorial-secondary to acute renal failure from Metformin + Acute Urinary retention secondary to Vesicare. baseline creatinine is 1.04 08/31/2011 and on admission 11/12/2011 was 10.34. Resume sodium bicarbonate. Significant improvement in renal function creatinine currently at 0.96. ACE inhibitor was discontinued. Resolved at this  juncture 5. Hyponatremia-possibly secondary to severe sepsis vs. acute kidney injury-patient with poor oral intake, only drinking. Resolved currently. 6. Severe hypoglycemia-potentially secondary to #4, and altered pharmacokinetics of metformin/sulfonylurea-should fully return as kidney function results. Improved. Off D5 and D10. CBGs > 100. Resolved 7. Pain in lower abdomen/ileus-patient states he has some discomfort in the stomach and was started on Vesicare which was titrated upwards-this has a 3-5% risk of urinary tract infection and about a 1% risk of urinary retention and could be the etiology presenting illness. Likely etiologies ileus per acute abdominal series which was obtained. Patient with gaseous distention/ileus. Repeat xrays with no significant change. Continue bowel rest.Will try to keep patient's potassium greater than 4.5 and magnesium greater than 2. Patient is passing gas however no bowel movement. Mobilize (have placed order for up as tolerated and have instructed patient to ambulate today).Conservative treatment and monitor. Serial abdominal films. If no significant improvement in the next 24-48 hours may consider a GI or surgical consult. 8. Hypertension-was on lisinopril at home. Switch IV metoprolol to oral.  Will reassess blood pressure 9. History of prostate cancer/Acute urinary retention-consulted urology 3 days ago per Dr Mahala Menghini. Patient started on flomax and foley catheter to be kept in for next 2 weeks with voiding trial as outpatient per Dr Pandora Leiter note after he spoke with urology. 10. Fever- unkown etiology. Afebrile x last 24 hours. Repeat urinalysis is negative for nitrites. Acute abdominal series is consistent with an ileus. Blood cultures are pending with no growth to date.  -Suspecting urinary source although urine culture has had no growth.  At this point will place on levaquin and reassess  11. Rash- Resolving etiology uncertain.  No complaints today.   Code  Status: Full  Family Communication: updated patient and daughter at bedside.  Disposition Plan: Transfer to telemetry. Home when medically stable.   Brief narrative:  61 yr old male admitted 7/29 with severe sepsis, potentially 2/2 to Urinary cause. He stented in potentially severe sepsis and was noted to be on multiple diabetic agents that can cause renal insufficiency. He has improved to the point that he was transferred out of the ICU to SDU status. His ABX have been adjusted and he remains hemodynamically stable. I have consulted Urology 7/30 for recommendations and his family is aware of our thoughts.   Consultants:  Urology 7/30.per Dr Mahala Menghini, but no note seen.   Procedures:  CT head 10/2011 = no evidence for acute intracranial abnormality  Antibiotics:  Zosyn 7/28  Vancomycin started 7/30  Blood Cult x 2 pending  UC neg.    HPI/Subjective: Patient has no new complaints today. Mentions that he feels much better today and is alert and aware of what is going on around him.  Objective: Filed Vitals:   11/16/11 1400 11/16/11 2216 11/17/11 0615 11/17/11 1343  BP: 123/76 125/82 134/87 151/93  Pulse: 70 76 58 71  Temp: 98 F (36.7 C) 98.4 F (36.9 C) 98.4 F (36.9 C) 98.2 F (36.8 C)  TempSrc: Oral Oral Oral Oral  Resp: 16 18 16 16   Height:      Weight:      SpO2: 97% 96% 100% 99%    Intake/Output Summary (Last 24 hours) at 11/17/11 1512 Last data filed at 11/17/11 1345  Gross per 24 hour  Intake 18161.67 ml  Output   2350 ml  Net 15811.67 ml    Exam:   General:  Pt in NAD, A and O x 3  Cardiovascular: RRR, No MRG  Respiratory: CTA BL, no wheezes  Abdomen: Soft, ND, NT  Data Reviewed: Basic Metabolic Panel:  Lab 11/17/11 4098 11/16/11 1754 11/16/11 0415 11/15/11 1632 11/15/11 0915  NA 136 133* 133* 133* 130*  K 5.3* 4.6 3.7 4.2 3.8  CL 108 104 102 101 98  CO2 18* 18* 19 20 19   GLUCOSE 135* 126* 149* 132* 167*  BUN 11 11 11 11 10   CREATININE 0.96  0.99 1.11 1.16 1.18  CALCIUM 8.5 8.6 9.0 8.5 8.3*  MG 2.0 -- -- -- 1.7  PHOS -- -- -- -- --   Liver Function Tests:  Lab 11/12/11 0445 11/11/11 2130  AST 24 23  ALT 40 40  ALKPHOS 64 69  BILITOT 0.2* 0.1*  PROT 6.7 7.1  ALBUMIN 2.8* 3.0*   No results found for this basename: LIPASE:5,AMYLASE:5 in the last 168 hours No results found for this basename: AMMONIA:5 in the last 168 hours CBC:  Lab 11/16/11 0415 11/15/11 0315 11/14/11 0315 11/12/11 0445 11/11/11 2005  WBC 9.6 11.1* 8.5 6.1 7.1  NEUTROABS 6.6 -- 6.6 -- 5.7  HGB 12.3* 11.3* 12.4* 11.3* 12.1*  HCT 36.1* 32.6* 35.5* 31.5* 35.1*  MCV 84.7 84.0 84.5 83.1 86.2  PLT 368 333 311 303 309   Cardiac Enzymes: No results found for this basename: CKTOTAL:5,CKMB:5,CKMBINDEX:5,TROPONINI:5 in the last 168 hours BNP (last 3 results) No results found for this basename: PROBNP:3 in the last 8760 hours CBG:  Lab 11/17/11 1201 11/17/11 0813 11/16/11 2211 11/16/11 1708 11/16/11 1254  GLUCAP 153* 161* 185* 123* 155*    Recent Results (from the past 240 hour(s))  URINE CULTURE     Status: Normal   Collection Time   11/11/11  9:19 PM      Component Value Range Status Comment   Specimen Description URINE, CATHETERIZED   Final    Special  Requests NONE   Final    Culture  Setup Time 11/12/2011 01:38   Final    Colony Count NO GROWTH   Final    Culture NO GROWTH   Final    Report Status 11/13/2011 FINAL   Final   CULTURE, BLOOD (ROUTINE X 2)     Status: Normal (Preliminary result)   Collection Time   11/11/11  9:46 PM      Component Value Range Status Comment   Specimen Description BLOOD RIGHT ANTECUBITAL   Final    Special Requests BOTTLES DRAWN AEROBIC ONLY 5CC   Final    Culture  Setup Time 11/12/2011 01:40   Final    Culture     Final    Value:        BLOOD CULTURE RECEIVED NO GROWTH TO DATE CULTURE WILL BE HELD FOR 5 DAYS BEFORE ISSUING A FINAL NEGATIVE REPORT   Report Status PENDING   Incomplete   CULTURE, BLOOD (ROUTINE X 2)      Status: Normal (Preliminary result)   Collection Time   11/11/11 10:00 PM      Component Value Range Status Comment   Specimen Description BLOOD RIGHT HAND   Final    Special Requests BOTTLES DRAWN AEROBIC ONLY 2CC   Final    Culture  Setup Time 11/12/2011 01:40   Final    Culture     Final    Value:        BLOOD CULTURE RECEIVED NO GROWTH TO DATE CULTURE WILL BE HELD FOR 5 DAYS BEFORE ISSUING A FINAL NEGATIVE REPORT   Report Status PENDING   Incomplete   MRSA PCR SCREENING     Status: Abnormal   Collection Time   11/11/11 11:26 PM      Component Value Range Status Comment   MRSA by PCR POSITIVE (*) NEGATIVE Final   CULTURE, BLOOD (ROUTINE X 2)     Status: Normal (Preliminary result)   Collection Time   11/13/11  9:40 AM      Component Value Range Status Comment   Specimen Description BLOOD LEFT ARM   Final    Special Requests BOTTLES DRAWN AEROBIC AND ANAEROBIC 5CC EACH   Final    Culture  Setup Time 11/13/2011 14:51   Final    Culture     Final    Value:        BLOOD CULTURE RECEIVED NO GROWTH TO DATE CULTURE WILL BE HELD FOR 5 DAYS BEFORE ISSUING A FINAL NEGATIVE REPORT   Report Status PENDING   Incomplete   CULTURE, BLOOD (ROUTINE X 2)     Status: Normal (Preliminary result)   Collection Time   11/13/11  9:41 AM      Component Value Range Status Comment   Specimen Description BLOOD LEFT ARM   Final    Special Requests BOTTLES DRAWN AEROBIC AND ANAEROBIC 5 CC EACH   Final    Culture  Setup Time 11/13/2011 14:51   Final    Culture     Final    Value:        BLOOD CULTURE RECEIVED NO GROWTH TO DATE CULTURE WILL BE HELD FOR 5 DAYS BEFORE ISSUING A FINAL NEGATIVE REPORT   Report Status PENDING   Incomplete   URINE CULTURE     Status: Normal   Collection Time   11/14/11  9:56 AM      Component Value Range Status Comment   Specimen Description URINE, RANDOM  Final    Special Requests NONE   Final    Culture  Setup Time 11/14/2011 16:54   Final    Colony Count NO GROWTH   Final     Culture NO GROWTH   Final    Report Status 11/15/2011 FINAL   Final      Studies: Dg Abd 1 View  11/17/2011  *RADIOLOGY REPORT*  Clinical Data: Pain and distension  ABDOMEN - 1 VIEW  Comparison: 11/16/11  Findings: Scattered large and small bowel again identified.  The overall appearance is similar to that seen on the prior exam.  No free air is seen.  Prostate brachytherapy seeds are noted. Degenerate changes of the lumbar spine are noted.  IMPRESSION: No change from prior exam.  Original Report Authenticated By: Phillips Odor, M.D.   Dg Abd 1 View  11/16/2011  *RADIOLOGY REPORT*  Clinical Data: Abdominal pain and constipation  ABDOMEN - 1 VIEW  Comparison: Yesterday  Findings: Generalized distention of both small and large bowel is stable.  Brachia therapy pellets in the prostate gland.  No obvious free intraperitoneal gas.  IMPRESSION: Stable generalized small and large bowel distention.  Original Report Authenticated By: Donavan Burnet, M.D.   Dg Abd 1 View  11/15/2011  *RADIOLOGY REPORT*  Clinical Data: Nausea, vomiting, abdominal pain  ABDOMEN - 1 VIEW  Comparison: 11/14/2011  Findings: Diffuse gaseous distention of small and large bowel. Appearance more consistent with an ileus than obstruction. Degenerative changes of the spine.  Radiation prostate seeds noted. Limited with motion artifact.  No definite abnormal calcification.  IMPRESSION: Diffuse gaseous distention of the small and large bowel compatible with ileus.  Original Report Authenticated By: Judie Petit. Ruel Favors, M.D.   Ct Head Wo Contrast  11/11/2011  *RADIOLOGY REPORT*  Clinical Data: Dizziness.  Hypoglycemia.  Headache.  Recent seed implants for prostate cancer on 10/13/2011.  CT HEAD WITHOUT CONTRAST  Technique:  Contiguous axial images were obtained from the base of the skull through the vertex without contrast.  Comparison: 06/08/2008  Findings: There is no intra or extra-axial fluid collection or mass lesion.  The basilar cisterns  and ventricles have a normal appearance.  There is no CT evidence for acute infarction or hemorrhage.  Bone windows show no acute findings. 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  IMPRESSION: No evidence for acute intracranial abnormality.  Original Report Authenticated By: Patterson Hammersmith, M.D.   Dg Abd Acute W/chest  11/14/2011  *RADIOLOGY REPORT*  Clinical Data: Fever and abdominal pain.  Prostate cancer  ACUTE ABDOMEN SERIES (ABDOMEN 2 VIEW & CHEST 1 VIEW)  Comparison: 06/26/2011  Findings: Heart size and vascularity are normal.  Lungs are clear.  Gas is present and mildly distended colon.  Small bowel is not dilated.  Air-fluid levels are present in the right and transverse colon.  Negative for free air.  Seeds are present in the prostate gland.  No acute bony changes.  IMPRESSION: No active cardiopulmonary disease.  Mild colonic distention with air-fluid levels may represent gastroenteritis or ileus.  Original Report Authenticated By: Camelia Phenes, M.D.    Scheduled Meds:   . aspirin EC  81 mg Oral Daily  . Chlorhexidine Gluconate Cloth  6 each Topical Daily  . cyclobenzaprine  10 mg Oral TID  . enoxaparin (LOVENOX) injection  40 mg Subcutaneous Q24H  . insulin aspart  0-9 Units Subcutaneous TID WC  . levofloxacin  750 mg Oral Daily  . metoprolol  10 mg Intravenous Q8H  . mupirocin ointment  1 application Nasal BID  . potassium chloride  10 mEq Intravenous Q1 Hr x 4  . simethicone  160 mg Oral QID  . sodium bicarbonate  650 mg Oral BID  . sodium chloride  3 mL Intravenous Q12H  . sodium polystyrene  15 g Rectal Once  . Tamsulosin HCl  0.4 mg Oral QPC supper  . DISCONTD: levofloxacin (LEVAQUIN) IV  750 mg Intravenous Q24H  . DISCONTD: piperacillin-tazobactam (ZOSYN)  IV  3.375 g Intravenous Q8H  . DISCONTD: sodium polystyrene  15 g Oral Once  . DISCONTD: vancomycin  1,000 mg Intravenous Q8H   Continuous Infusions:   . sodium chloride 125 mL/hr at 11/17/11  0831  . DISCONTD: 0.9 % NaCl with KCl 40 mEq / L 125 mL/hr at 11/16/11 2307    Principal Problem:  *Acute renal failure Active Problems:  DM, UNCOMPLICATED, TYPE II, UNCONTROLLED  ASTHMA  DEGENERATIVE JOINT DISEASE, HIPS  DEGENERATIVE DISC DISEASE, LUMBOSACRAL SPINE  Prostate cancer  Hypertension  Hypoglycemia associated with diabetes  Anemia  Hyperkalemia  Hyponatremia  Urinary retention  Rash  Fever  Ileus    Time spent: > 35 minutes    Penny Pia  [facility] Pager [phone]. If 8PM-8AM, please contact night-coverage at [email or web address], password [email or web address] 11/17/2011, 3:12 PM  LOS: 6 days

## 2011-11-17 NOTE — Progress Notes (Signed)
Patient stated that he needed to void but was unable to void. The foley had been removed earlier on 11/16/11.  The bladder scanner yielded 752 ml. The MD on call was notified and an order was given to insert a foley.

## 2011-11-17 NOTE — Progress Notes (Signed)
A 16 Fr  non-latex foley catheter was inserted into the patient's catheter. There was immediate urine return.

## 2011-11-18 DIAGNOSIS — Z87448 Personal history of other diseases of urinary system: Secondary | ICD-10-CM

## 2011-11-18 LAB — CULTURE, BLOOD (ROUTINE X 2): Culture: NO GROWTH

## 2011-11-18 LAB — GLUCOSE, CAPILLARY
Glucose-Capillary: 102 mg/dL — ABNORMAL HIGH (ref 70–99)
Glucose-Capillary: 198 mg/dL — ABNORMAL HIGH (ref 70–99)

## 2011-11-18 LAB — CBC
Hemoglobin: 10.9 g/dL — ABNORMAL LOW (ref 13.0–17.0)
RBC: 3.67 MIL/uL — ABNORMAL LOW (ref 4.22–5.81)
WBC: 6.2 10*3/uL (ref 4.0–10.5)

## 2011-11-18 LAB — BASIC METABOLIC PANEL
GFR calc Af Amer: >90 mL/min (ref >90)
GFR calc non Af Amer: >90 mL/min (ref >90)
Potassium: 4.3 mEq/L (ref 3.5–5.1)
Sodium: 135 mEq/L (ref 135–145)

## 2011-11-18 NOTE — Progress Notes (Signed)
[facility] PROGRESS NOTE  Cole Conrad FAO:[id number] DOB: [birth year] DOA: 11/11/2011 PCP: Georganna Skeans, MD  Assessment/Plan: Principal Problem:  *Acute renal failure Active Problems:  DM, UNCOMPLICATED, TYPE II, UNCONTROLLED  ASTHMA  DEGENERATIVE JOINT DISEASE, HIPS  DEGENERATIVE DISC DISEASE, LUMBOSACRAL SPINE  Prostate cancer  Hypertension  Hypoglycemia associated with diabetes  Anemia  Hyperkalemia  Hyponatremia  Urinary retention  Rash  Fever  Ileus  1. Severe sepsis Unknown etiology. Patient afebribe x 24 hours. Patient had with fever spikes with temp of 100.6 4 nights ago night at 7pm, unknown etiology.- Continue IV fluid repletion. Precipitants would include most likely urinary source, given recent instrumentation of bladder and history of prostate cancer-will consider involving urology,. Blood cultures pending, urine cultures obtained on admission negative . Denies any diarrhea currently. Repeat urinalysis with negative nitrites, trace leukocytes with 3-6 WBCs. Acute abdominal series consistent with gaseous distention consistent with ileus. Hemodynamically he has improved.  - d/c Vancomycin and Zosyn and place on levaquin for suspected infection originating from urinary tract due to leukocytes found in urinalysis on u/a collected in 11/14/11.  - Resolved at this juncture 2. Tachycardia: Resolved and was likely due to anxiety vs infectious etiology. 3. Undifferentiated Lactic acidosis-multifactorial. Potentially secondary to Amaryl and metformin use in the context of acute renal failure secondary to obstructive uropathy, +/- sepsis. Anion gap on admission was 28. Current anion gap is improved.  Resolved  4. Acute renal insufficiency-multifactorial-secondary to acute renal failure from Metformin + Acute Urinary retention secondary to Vesicare. baseline creatinine is 1.04 08/31/2011 and on admission 11/12/2011 was 10.34. Resume sodium bicarbonate. Significant improvement in  renal function creatinine currently at 0.96. ACE inhibitor was discontinued. Resolved at this juncture.  Will plan on discharging with foley in place and follow up with urologist for voiding trial. 5. Hyponatremia-possibly secondary to severe sepsis vs. acute kidney injury-patient with poor oral intake, only drinking. Resolved currently. 6. Severe hypoglycemia-potentially secondary to #4, and altered pharmacokinetics of metformin/sulfonylurea-should fully return as kidney function results. Improved. Off D5 and D10. CBGs > 100. Resolved 7. Pain in lower abdomen/ileus-patient states he has some discomfort in the stomach and was started on Vesicare which was titrated upwards-this has a 3-5% risk of urinary tract infection and about a 1% risk of urinary retention and could be the etiology presenting illness. Likely etiologies ileus per acute abdominal series which was obtained. Patient with gaseous distention/ileus. Repeat xrays with no significant change. Continue bowel rest.Will try to keep patient's potassium greater than 4.5 and magnesium greater than 2. Patient is passing gas however no bowel movement. Mobilize (have placed order for up as tolerated and have instructed patient to ambulate today).Conservative treatment and monitor. Serial abdominal films. Patient had BM yesterday and improved with ambulation and increased po intake.  Also reports that he was able to tolerate some noodle soup yesterday. 8. Hypertension-was on lisinopril at home. Switch IV metoprolol to oral. Will reassess blood pressure 9. History of prostate cancer/Acute urinary retention-consulted urology 3 days ago per Dr Mahala Menghini. Patient started on flomax and foley catheter to be kept in for next 2 weeks with voiding trial as outpatient per Dr Pandora Leiter note after he spoke with urology. 10. Fever- unkown etiology. Afebrile x last 24 hours. Repeat urinalysis is negative for nitrites. Acute abdominal series is consistent with an ileus. Blood  cultures with no growth to date.  -Suspecting urinary source although urine culture has had no growth. On levaquin with continued improvement in condition.  Will plan for a  7-10 day course of antibiotic therapy 11. Rash- Resolving etiology uncertain. No complaints today. 12. Hyperkalemia: Resolved after kayexalate administration.   Code Status: Full  Family Communication: updated patient and daughter at bedside.  Disposition Plan: Transfer to telemetry. Home when medically stable.   Brief narrative:  61 yr old male admitted 7/29 with severe sepsis, potentially 2/2 to Urinary cause. He stented in potentially severe sepsis and was noted to be on multiple diabetic agents that can cause renal insufficiency. He has improved to the point that he was transferred out of the ICU to SDU status. His ABX have been adjusted and he remains hemodynamically stable. I have consulted Urology 7/30 for recommendations and his family is aware of our thoughts.  Consultants:  Urology 7/30.per Dr Mahala Menghini, but no note seen. Procedures:  CT head 10/2011 = no evidence for acute intracranial abnormality Antibiotics:  Zosyn 7/28  Vancomycin started 7/30  Blood Cult x 2 pending  UC neg.     HPI/Subjective: Pt reports that he had a bowel movement yesterday.  Denies any abdominal discomfort, nausea, emesis.  Reports that he had some soup and tolerated well.  No acute issues overnight.  Objective: Filed Vitals:   11/17/11 1343 11/17/11 2115 11/18/11 0729 11/18/11 1401  BP: 151/93 133/90 126/83 132/85  Pulse: 71 87 69 64  Temp: 98.2 F (36.8 C) 97.4 F (36.3 C) 98.5 F (36.9 C) 98.8 F (37.1 C)  TempSrc: Oral Oral Oral Oral  Resp: 16 16 16 16   Height:      Weight:      SpO2: 99% 97% 99% 99%    Intake/Output Summary (Last 24 hours) at 11/18/11 1428 Last data filed at 11/18/11 1100  Gross per 24 hour  Intake 1780.42 ml  Output   6350 ml  Net -4569.58 ml    Exam:  General: Pt in NAD, A and O x 3    Cardiovascular: RRR, No MRG  Respiratory: CTA BL, no wheezes  Abdomen: Soft, ND, NT  Data Reviewed: Basic Metabolic Panel:  Lab 11/18/11 4098 11/17/11 0530 11/16/11 1754 11/16/11 0415 11/15/11 1632 11/15/11 0915  NA 135 136 133* 133* 133* --  K 4.3 5.3* 4.6 3.7 4.2 --  CL 106 108 104 102 101 --  CO2 19 18* 18* 19 20 --  GLUCOSE 119* 135* 126* 149* 132* --  BUN 8 11 11 11 11  --  CREATININE 0.93 0.96 0.99 1.11 1.16 --  CALCIUM 8.8 8.5 8.6 9.0 8.5 --  MG -- 2.0 -- -- -- 1.7  PHOS -- -- -- -- -- --   Liver Function Tests:  Lab 11/12/11 0445 11/11/11 2130  AST 24 23  ALT 40 40  ALKPHOS 64 69  BILITOT 0.2* 0.1*  PROT 6.7 7.1  ALBUMIN 2.8* 3.0*   No results found for this basename: LIPASE:5,AMYLASE:5 in the last 168 hours No results found for this basename: AMMONIA:5 in the last 168 hours CBC:  Lab 11/18/11 0955 11/16/11 0415 11/15/11 0315 11/14/11 0315 11/12/11 0445 11/11/11 2005  WBC 6.2 9.6 11.1* 8.5 6.1 --  NEUTROABS -- 6.6 -- 6.6 -- 5.7  HGB 10.9* 12.3* 11.3* 12.4* 11.3* --  HCT 31.2* 36.1* 32.6* 35.5* 31.5* --  MCV 85.0 84.7 84.0 84.5 83.1 --  PLT 359 368 333 311 303 --   Cardiac Enzymes: No results found for this basename: CKTOTAL:5,CKMB:5,CKMBINDEX:5,TROPONINI:5 in the last 168 hours BNP (last 3 results) No results found for this basename: PROBNP:3 in the last 8760 hours CBG:  Lab 11/18/11 1233 11/18/11 0853 11/17/11 2154 11/17/11 1729 11/17/11 1201  GLUCAP 198* 102* 187* 144* 153*    Recent Results (from the past 240 hour(s))  URINE CULTURE     Status: Normal   Collection Time   11/11/11  9:19 PM      Component Value Range Status Comment   Specimen Description URINE, CATHETERIZED   Final    Special Requests NONE   Final    Culture  Setup Time 11/12/2011 01:38   Final    Colony Count NO GROWTH   Final    Culture NO GROWTH   Final    Report Status 11/13/2011 FINAL   Final   CULTURE, BLOOD (ROUTINE X 2)     Status: Normal   Collection Time   11/11/11   9:46 PM      Component Value Range Status Comment   Specimen Description BLOOD RIGHT ANTECUBITAL   Final    Special Requests BOTTLES DRAWN AEROBIC ONLY 5CC   Final    Culture  Setup Time 11/12/2011 01:40   Final    Culture NO GROWTH 5 DAYS   Final    Report Status 11/18/2011 FINAL   Final   CULTURE, BLOOD (ROUTINE X 2)     Status: Normal   Collection Time   11/11/11 10:00 PM      Component Value Range Status Comment   Specimen Description BLOOD RIGHT HAND   Final    Special Requests BOTTLES DRAWN AEROBIC ONLY 2CC   Final    Culture  Setup Time 11/12/2011 01:40   Final    Culture NO GROWTH 5 DAYS   Final    Report Status 11/18/2011 FINAL   Final   MRSA PCR SCREENING     Status: Abnormal   Collection Time   11/11/11 11:26 PM      Component Value Range Status Comment   MRSA by PCR POSITIVE (*) NEGATIVE Final   CULTURE, BLOOD (ROUTINE X 2)     Status: Normal (Preliminary result)   Collection Time   11/13/11  9:40 AM      Component Value Range Status Comment   Specimen Description BLOOD LEFT ARM   Final    Special Requests BOTTLES DRAWN AEROBIC AND ANAEROBIC 5CC EACH   Final    Culture  Setup Time 11/13/2011 14:51   Final    Culture     Final    Value:        BLOOD CULTURE RECEIVED NO GROWTH TO DATE CULTURE WILL BE HELD FOR 5 DAYS BEFORE ISSUING A FINAL NEGATIVE REPORT   Report Status PENDING   Incomplete   CULTURE, BLOOD (ROUTINE X 2)     Status: Normal (Preliminary result)   Collection Time   11/13/11  9:41 AM      Component Value Range Status Comment   Specimen Description BLOOD LEFT ARM   Final    Special Requests BOTTLES DRAWN AEROBIC AND ANAEROBIC 5 CC EACH   Final    Culture  Setup Time 11/13/2011 14:51   Final    Culture     Final    Value:        BLOOD CULTURE RECEIVED NO GROWTH TO DATE CULTURE WILL BE HELD FOR 5 DAYS BEFORE ISSUING A FINAL NEGATIVE REPORT   Report Status PENDING   Incomplete   URINE CULTURE     Status: Normal   Collection Time   11/14/11  9:56 AM  Component Value Range Status Comment   Specimen Description URINE, RANDOM   Final    Special Requests NONE   Final    Culture  Setup Time 11/14/2011 16:54   Final    Colony Count NO GROWTH   Final    Culture NO GROWTH   Final    Report Status 11/15/2011 FINAL   Final      Studies: Dg Abd 1 View  11/17/2011  *RADIOLOGY REPORT*  Clinical Data: Pain and distension  ABDOMEN - 1 VIEW  Comparison: 11/16/11  Findings: Scattered large and small bowel again identified.  The overall appearance is similar to that seen on the prior exam.  No free air is seen.  Prostate brachytherapy seeds are noted. Degenerate changes of the lumbar spine are noted.  IMPRESSION: No change from prior exam.  Original Report Authenticated By: Phillips Odor, M.D.   Dg Abd 1 View  11/16/2011  *RADIOLOGY REPORT*  Clinical Data: Abdominal pain and constipation  ABDOMEN - 1 VIEW  Comparison: Yesterday  Findings: Generalized distention of both small and large bowel is stable.  Brachia therapy pellets in the prostate gland.  No obvious free intraperitoneal gas.  IMPRESSION: Stable generalized small and large bowel distention.  Original Report Authenticated By: Donavan Burnet, M.D.   Dg Abd 1 View  11/15/2011  *RADIOLOGY REPORT*  Clinical Data: Nausea, vomiting, abdominal pain  ABDOMEN - 1 VIEW  Comparison: 11/14/2011  Findings: Diffuse gaseous distention of small and large bowel. Appearance more consistent with an ileus than obstruction. Degenerative changes of the spine.  Radiation prostate seeds noted. Limited with motion artifact.  No definite abnormal calcification.  IMPRESSION: Diffuse gaseous distention of the small and large bowel compatible with ileus.  Original Report Authenticated By: Judie Petit. Ruel Favors, M.D.   Ct Head Wo Contrast  11/11/2011  *RADIOLOGY REPORT*  Clinical Data: Dizziness.  Hypoglycemia.  Headache.  Recent seed implants for prostate cancer on 10/13/2011.  CT HEAD WITHOUT CONTRAST  Technique:  Contiguous axial  images were obtained from the base of the skull through the vertex without contrast.  Comparison: 06/08/2008  Findings: There is no intra or extra-axial fluid collection or mass lesion.  The basilar cisterns and ventricles have a normal appearance.  There is no CT evidence for acute infarction or hemorrhage.  Bone windows show no acute findings. 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  IMPRESSION: No evidence for acute intracranial abnormality.  Original Report Authenticated By: Patterson Hammersmith, M.D.   Dg Abd Acute W/chest  11/14/2011  *RADIOLOGY REPORT*  Clinical Data: Fever and abdominal pain.  Prostate cancer  ACUTE ABDOMEN SERIES (ABDOMEN 2 VIEW & CHEST 1 VIEW)  Comparison: 06/26/2011  Findings: Heart size and vascularity are normal.  Lungs are clear.  Gas is present and mildly distended colon.  Small bowel is not dilated.  Air-fluid levels are present in the right and transverse colon.  Negative for free air.  Seeds are present in the prostate gland.  No acute bony changes.  IMPRESSION: No active cardiopulmonary disease.  Mild colonic distention with air-fluid levels may represent gastroenteritis or ileus.  Original Report Authenticated By: Camelia Phenes, M.D.    Scheduled Meds:   . aspirin EC  81 mg Oral Daily  . Chlorhexidine Gluconate Cloth  6 each Topical Daily  . cyclobenzaprine  10 mg Oral TID  . enoxaparin (LOVENOX) injection  40 mg Subcutaneous Q24H  . insulin aspart  0-9 Units Subcutaneous TID WC  .  levofloxacin  750 mg Oral Daily  . metoprolol tartrate  50 mg Oral BID  . simethicone  160 mg Oral QID  . sodium bicarbonate  650 mg Oral BID  . sodium chloride  3 mL Intravenous Q12H  . Tamsulosin HCl  0.4 mg Oral QPC supper  . DISCONTD: metoprolol  10 mg Intravenous Q8H  . DISCONTD: metoprolol tartrate  25 mg Oral BID   Continuous Infusions:   . DISCONTD: sodium chloride 125 mL/hr at 11/17/11 1558    Principal Problem:  *Acute renal failure Active  Problems:  DM, UNCOMPLICATED, TYPE II, UNCONTROLLED  ASTHMA  DEGENERATIVE JOINT DISEASE, HIPS  DEGENERATIVE DISC DISEASE, LUMBOSACRAL SPINE  Prostate cancer  Hypertension  Hypoglycemia associated with diabetes  Anemia  Hyperkalemia  Hyponatremia  Urinary retention  Rash  Fever  Ileus    Time spent: > 35 minutes    Penny Pia  [facility] Pager [phone]. If 8PM-8AM, please contact night-coverage at [email or web address], password [facility] 11/18/2011, 2:28 PM  LOS: 7 days

## 2011-11-19 DIAGNOSIS — N400 Enlarged prostate without lower urinary tract symptoms: Secondary | ICD-10-CM

## 2011-11-19 LAB — CULTURE, BLOOD (ROUTINE X 2)
Culture: NO GROWTH
Culture: NO GROWTH

## 2011-11-19 LAB — GLUCOSE, CAPILLARY: Glucose-Capillary: 183 mg/dL — ABNORMAL HIGH (ref 70–99)

## 2011-11-19 MED ORDER — SODIUM BICARBONATE 650 MG PO TABS: 325.0000 mg | ORAL_TABLET | Freq: Every day | ORAL | Status: DC

## 2011-11-19 MED ORDER — TAMSULOSIN HCL 0.4 MG PO CAPS: 0.4000 mg | ORAL_CAPSULE | Freq: Every day | ORAL | Status: DC

## 2011-11-19 MED ORDER — METOPROLOL TARTRATE 50 MG PO TABS: 50.0000 mg | ORAL_TABLET | Freq: Two times a day (BID) | ORAL | Status: DC

## 2011-11-19 MED ORDER — SODIUM BICARBONATE 325 MG PO TABS: 325.0000 mg | ORAL_TABLET | Freq: Two times a day (BID) | ORAL | Status: DC

## 2011-11-19 NOTE — Progress Notes (Signed)
cbg 183 patient has finished breakfast tray

## 2011-11-19 NOTE — Care Management Note (Signed)
    Page 1 of 2   11/19/2011     2:47:27 PM   CARE MANAGEMENT NOTE 11/19/2011  Patient:  Cole Conrad, Cole Conrad   Account Number:  [id number]  Date Initiated:  11/12/2011  Documentation initiated by:  DAVIS,RHONDA  Subjective/Objective Assessment:   admission glucose 11, k+6.4, ams,iv bicarb.,iv d50,     Action/Plan:   lives at home   Anticipated DC Date:  11/19/2011   Anticipated DC Plan:  HOME W HOME HEALTH SERVICES  In-house referral  NA      DC Planning Services  CM consult      PAC Choice  NA   Choice offered to / List presented to:  C-1 Patient   DME arranged  NA      DME agency  NA     HH arranged  HH-1 RN      [facility] agency  [facility]   Status of service:  Completed, signed off Medicare Important Message given?  NA - LOS <3 / Initial given by admissions (If response is "NO", the following Medicare IM given date fields will be blank) Date Medicare IM given:   Date Additional Medicare IM given:    Discharge Disposition:  HOME W HOME HEALTH SERVICES  Per UR Regulation:  Reviewed for med. necessity/level of care/duration of stay  If discussed at Long Length of Stay Meetings, dates discussed:    Comments:  11/19/11 Lucillia Corson RN,BSN NCM [phone] [facility] INFOMRED OF REFERRAL,& D/C.HHRN-F/C INSTRUCTION. [id number]/Rhonda Earlene Plater, RN, BSN, CCM: CHART REVIEWED AND UPDATED. NO DISCHARGE NEEDS PRESENT AT THIS TIME. CASE MANAGEMENT [phone]

## 2011-11-19 NOTE — Discharge Summary (Signed)
Physician Discharge Summary  Cole Conrad ZOX:[id number] DOB: 09-10-1950 DOA: 11/11/2011  PCP: Georganna Skeans, MD  Admit date: 11/11/2011 Discharge date: 11/19/2011  Recommendations for Outpatient Follow-up:  1. Please evaluate patient's blood sugars and adjust medication, Creatinine level, and assess and decide whether patient needs to continue sodium bicarb or not.   2. Patient will need a voiding trial as he had worsening creatinine level which resolved with foley placement.  Urologist appointment for 11/26/11 at 1045 AM  Discharge Diagnoses:  Principal Problem:  *Acute renal failure Active Problems:  DM, UNCOMPLICATED, TYPE II, UNCONTROLLED  ASTHMA  DEGENERATIVE JOINT DISEASE, HIPS  DEGENERATIVE DISC DISEASE, LUMBOSACRAL SPINE  Prostate cancer  Hypertension  Hypoglycemia associated with diabetes  Anemia  Hyperkalemia  Hyponatremia  Urinary retention  Rash  Fever  Ileus   Discharge Condition: Stable  Diet recommendation: Diabetic diet  Wt Readings from Last 3 Encounters:  11/15/11 89.6 kg (197 lb 8.5 oz)  09/27/11 90.583 kg (199 lb 11.2 oz)  09/03/11 95.255 kg (210 lb)    History of present illness:  From original HPI: 61 yo man with prostate cancer s/p radioactive seed implants in 08/2011 was brought in today to [place] emergency room with persistent hypoglycemia. The patient reports that he has not had problems like this before. He reports some chills at home and chronic back pain. He reports dysuria and that he has not had any changes in his diabetes medications. EMS came to the house one time this morning treated him with D50 and left. The wife had to call EMS again because he was hypoglycemic again later during the day. When he arrived in the emergency room his CBGs were in the 20s. He had 800 cc of urine in his bladder and was unable to void.  He was also noted to have a diffuse skin rash that apparently started only today. The patient was confused during hypoglycemia  but he was alert and oriented after receiving D50. He denies any cough, sputum production, diarrhea.    Hospital Course:  1. Severe sepsis Unknown etiology. Patient afebrile > 24 hours. Patient had with fever spikes with temp of 100.6 5 nights ago night at 7pm, unknown etiology. Precipitants would include most likely urinary source, given recent instrumentation of bladder and history of prostate cancer. Blood cultures remained negative throughout stay urine cultures obtained on admission negative . Denies any diarrhea currently. Repeat urinalysis with negative nitrites, trace leukocytes with 3-6 WBCs. Acute abdominal series consistent with gaseous distention consistent with ileus which resolved. Hemodynamically he has improved.  - While in house patient was on Vanc and Zosyn but was able to be transitioned to Levaquin which he tolerated well.  He completed a 9 day course of antibiotics and as mentioned above has been afebrile for > 5 days.  Therefore will not d/c on antibiotics as patient has had an adequate length of therapy. - Sepsis resolved with antibiotic regimen. 2. Tachycardia: Resolved and was likely due to anxiety vs infectious etiology. 3. Undifferentiated Lactic acidosis-multifactorial. Potentially secondary to Amaryl and metformin use in the context of acute renal failure secondary to obstructive uropathy, +/- sepsis. Anion gap on admission was 28. Current anion gap is improved and patient was given oral bicarbonate therapy of which we will continue on discharge.  4. Acute renal insufficiency-multifactorial-secondary to worsening acute renal failure with concurrent use of lisinopril, Metformin + Acute Urinary retention secondary to Vesicare. baseline creatinine is 1.04 08/31/2011 and on admission 11/12/2011 was 10.34. Resume sodium bicarbonate on  d/c. Significant improvement in renal function creatinine currently at 0.93. ACE inhibitor was discontinued. Resolved at this juncture. Will plan on  discharging with foley in place and follow up with urologist for voiding trial. D/C off of Ace Inhibitor. 5. Hyponatremia-possibly secondary to severe sepsis vs. acute kidney injury-patient with poor oral intake, only drinking. Resolved currently. 6. Severe hypoglycemia-potentially secondary to #4, and altered pharmacokinetics of metformin/sulfonylurea-should fully return as kidney function results. Improved. Off D5 and D10. CBGs > 100. Resolved. - Will provide script for glucometer and recommend that patient stop taking his metformin on discharge and continue to take his Amaryl.  He is to keep a log of his blood sugars and present to his primary care physician.  7. Pain in lower abdomen/ileus-patient states he has some discomfort in the stomach and was started on Vesicare which was titrated upwards-this has a 3-5% risk of urinary tract infection and about a 1% risk of urinary retention and could be the etiology presenting illness. Likely etiologies ileus per acute abdominal series which was obtained. Patient with gaseous distention/ileus. Repeat xrays with no significant change. Continue bowel rest.Will try to keep patient's potassium greater than 4.5 and magnesium greater than 2.  Patient had BM yesterday and improved with ambulation and increased po fluid intake. Also reports that he was able to tolerate po intake without any difficulty nausea or emesis. 8. Hypertension-was on lisinopril at home which will be discontinued due to Acute Renal injury. Switch IV metoprolol to oral. Will reassess blood pressure 9. History of prostate cancer/Acute urinary retention-consulted urology per Dr Mahala Menghini. Patient started on flomax and foley catheter to be kept in for next 2 weeks with voiding trial as outpatient per Dr Pandora Leiter note after he spoke with urology. 10. Fever- unkown etiology. Afebrile for > 24 hours. Repeat urinalysis is negative for nitrites. Acute abdominal series is consistent with an ileus. Blood  cultures with no growth to date.  -Suspecting urinary source Patient has had 9 days of antibiotic therapy and as such will no continue as outpatient. 11. Rash- Resolving etiology uncertain. No complaints today. 12. Hyperkalemia: Resolved after kayexalate administration.   Procedures:  None  Consultations:  Discussed with Urology Per Dr. Mahala Menghini  Discharge Exam: Filed Vitals:   11/19/11 0700  BP: 149/87  Pulse: 60  Temp: 98.9 F (37.2 C)  Resp: 18   Filed Vitals:   11/18/11 0729 11/18/11 1401 11/18/11 2136 11/19/11 0700  BP: 126/83 132/85 135/79 149/87  Pulse: 69 64 75 60  Temp: 98.5 F (36.9 C) 98.8 F (37.1 C) 97.9 F (36.6 C) 98.9 F (37.2 C)  TempSrc: Oral Oral Oral Oral  Resp: 16 16 16 18   Height:      Weight:      SpO2: 99% 99% 99% 100%    General: Pt in NAD, A and O x 3 Cardiovascular: RRR, No MRG Respiratory: CTA BL, No wheezes  Discharge Instructions  Discharge Orders    Future Orders Please Complete By Expires   Diet - low sodium heart healthy      Increase activity slowly      Discharge instructions      Comments:   Discharge with home health for nursing to help with foley.  Patient is to follow up with his primary care physician Dr. August Saucer to check his blood sugars and adjust medication as well as consider discontinuing bicarbonate given continued improvement of renal function.  Also has appointment with urologist on Monday 8/12 at 1045 AM for  voiding trial at that time.   Call MD for:  temperature >100.4      Call MD for:  persistant nausea and vomiting      Call MD for:  severe uncontrolled pain      Call MD for:  redness, tenderness, or signs of infection (pain, swelling, redness, odor or green/yellow discharge around incision site)        Medication List  As of 11/19/2011 10:55 AM   STOP taking these medications         HYDROcodone-acetaminophen 10-325 MG per tablet      lisinopril 5 MG tablet      meloxicam 15 MG tablet      metformin  1000 MG (OSM) 24 hr tablet      naproxen sodium 220 MG tablet         TAKE these medications         aspirin 81 MG tablet   Take 81 mg by mouth daily.      cetirizine 10 MG tablet   Commonly known as: ZYRTEC   Take 10 mg by mouth daily.      cyclobenzaprine 10 MG tablet   Commonly known as: FLEXERIL   Take 10 mg by mouth 3 (three) times daily.      docusate sodium 100 MG capsule   Commonly known as: COLACE   Take 100 mg by mouth 2 (two) times daily.      glimepiride 4 MG tablet   Commonly known as: AMARYL   Take 4 mg by mouth daily before breakfast.      metoprolol 50 MG tablet   Commonly known as: LOPRESSOR   Take 1 tablet (50 mg total) by mouth 2 (two) times daily.      MULTIVITAMIN PO   Take 1 tablet by mouth daily.      omeprazole 20 MG capsule   Commonly known as: PRILOSEC   Take 20 mg by mouth every morning.      simethicone 80 MG chewable tablet   Commonly known as: MYLICON   Chew 80 mg by mouth every 6 (six) hours as needed.      sodium bicarbonate 325 MG tablet   Take 1 tablet (325 mg total) by mouth 2 (two) times daily.      Tamsulosin HCl 0.4 MG Caps   Commonly known as: FLOMAX   Take 1 capsule (0.4 mg total) by mouth daily after supper.      traMADol 50 MG tablet   Commonly known as: ULTRAM   Take 50 mg by mouth every 6 (six) hours as needed.              The results of significant diagnostics from this hospitalization (including imaging, microbiology, ancillary and laboratory) are listed below for reference.    Significant Diagnostic Studies: Dg Abd 1 View  11/17/2011  *RADIOLOGY REPORT*  Clinical Data: Pain and distension  ABDOMEN - 1 VIEW  Comparison: 11/16/11  Findings: Scattered large and small bowel again identified.  The overall appearance is similar to that seen on the prior exam.  No free air is seen.  Prostate brachytherapy seeds are noted. Degenerate changes of the lumbar spine are noted.  IMPRESSION: No change from prior exam.   Original Report Authenticated By: Phillips Odor, M.D.   Dg Abd 1 View  11/16/2011  *RADIOLOGY REPORT*  Clinical Data: Abdominal pain and constipation  ABDOMEN - 1 VIEW  Comparison: Yesterday  Findings: Generalized distention of both  small and large bowel is stable.  Brachia therapy pellets in the prostate gland.  No obvious free intraperitoneal gas.  IMPRESSION: Stable generalized small and large bowel distention.  Original Report Authenticated By: Donavan Burnet, M.D.   Dg Abd 1 View  11/15/2011  *RADIOLOGY REPORT*  Clinical Data: Nausea, vomiting, abdominal pain  ABDOMEN - 1 VIEW  Comparison: 11/14/2011  Findings: Diffuse gaseous distention of small and large bowel. Appearance more consistent with an ileus than obstruction. Degenerative changes of the spine.  Radiation prostate seeds noted. Limited with motion artifact.  No definite abnormal calcification.  IMPRESSION: Diffuse gaseous distention of the small and large bowel compatible with ileus.  Original Report Authenticated By: Judie Petit. Ruel Favors, M.D.   Ct Head Wo Contrast  11/11/2011  *RADIOLOGY REPORT*  Clinical Data: Dizziness.  Hypoglycemia.  Headache.  Recent seed implants for prostate cancer on 10/13/2011.  CT HEAD WITHOUT CONTRAST  Technique:  Contiguous axial images were obtained from the base of the skull through the vertex without contrast.  Comparison: 06/08/2008  Findings: There is no intra or extra-axial fluid collection or mass lesion.  The basilar cisterns and ventricles have a normal appearance.  There is no CT evidence for acute infarction or hemorrhage.  Bone windows show no acute findings. 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  IMPRESSION: No evidence for acute intracranial abnormality.  Original Report Authenticated By: Patterson Hammersmith, M.D.   Dg Abd Acute W/chest  11/14/2011  *RADIOLOGY REPORT*  Clinical Data: Fever and abdominal pain.  Prostate cancer  ACUTE ABDOMEN SERIES (ABDOMEN 2 VIEW & CHEST 1 VIEW)   Comparison: 06/26/2011  Findings: Heart size and vascularity are normal.  Lungs are clear.  Gas is present and mildly distended colon.  Small bowel is not dilated.  Air-fluid levels are present in the right and transverse colon.  Negative for free air.  Seeds are present in the prostate gland.  No acute bony changes.  IMPRESSION: No active cardiopulmonary disease.  Mild colonic distention with air-fluid levels may represent gastroenteritis or ileus.  Original Report Authenticated By: Camelia Phenes, M.D.    Microbiology: Recent Results (from the past 240 hour(s))  URINE CULTURE     Status: Normal   Collection Time   11/11/11  9:19 PM      Component Value Range Status Comment   Specimen Description URINE, CATHETERIZED   Final    Special Requests NONE   Final    Culture  Setup Time 11/12/2011 01:38   Final    Colony Count NO GROWTH   Final    Culture NO GROWTH   Final    Report Status 11/13/2011 FINAL   Final   CULTURE, BLOOD (ROUTINE X 2)     Status: Normal   Collection Time   11/11/11  9:46 PM      Component Value Range Status Comment   Specimen Description BLOOD RIGHT ANTECUBITAL   Final    Special Requests BOTTLES DRAWN AEROBIC ONLY 5CC   Final    Culture  Setup Time 11/12/2011 01:40   Final    Culture NO GROWTH 5 DAYS   Final    Report Status 11/18/2011 FINAL   Final   CULTURE, BLOOD (ROUTINE X 2)     Status: Normal   Collection Time   11/11/11 10:00 PM      Component Value Range Status Comment   Specimen Description BLOOD RIGHT HAND   Final    Special Requests BOTTLES  DRAWN AEROBIC ONLY 2CC   Final    Culture  Setup Time 11/12/2011 01:40   Final    Culture NO GROWTH 5 DAYS   Final    Report Status 11/18/2011 FINAL   Final   MRSA PCR SCREENING     Status: Abnormal   Collection Time   11/11/11 11:26 PM      Component Value Range Status Comment   MRSA by PCR POSITIVE (*) NEGATIVE Final   CULTURE, BLOOD (ROUTINE X 2)     Status: Normal   Collection Time   11/13/11  9:40 AM       Component Value Range Status Comment   Specimen Description BLOOD LEFT ARM   Final    Special Requests BOTTLES DRAWN AEROBIC AND ANAEROBIC 5CC EACH   Final    Culture  Setup Time 11/13/2011 14:51   Final    Culture NO GROWTH 5 DAYS   Final    Report Status 11/19/2011 FINAL   Final   CULTURE, BLOOD (ROUTINE X 2)     Status: Normal   Collection Time   11/13/11  9:41 AM      Component Value Range Status Comment   Specimen Description BLOOD LEFT ARM   Final    Special Requests BOTTLES DRAWN AEROBIC AND ANAEROBIC 5 CC EACH   Final    Culture  Setup Time 11/13/2011 14:51   Final    Culture NO GROWTH 5 DAYS   Final    Report Status 11/19/2011 FINAL   Final   URINE CULTURE     Status: Normal   Collection Time   11/14/11  9:56 AM      Component Value Range Status Comment   Specimen Description URINE, RANDOM   Final    Special Requests NONE   Final    Culture  Setup Time 11/14/2011 16:54   Final    Colony Count NO GROWTH   Final    Culture NO GROWTH   Final    Report Status 11/15/2011 FINAL   Final      Labs: Basic Metabolic Panel:  Lab 11/18/11 4098 11/17/11 0530 11/16/11 1754 11/16/11 0415 11/15/11 1632 11/15/11 0915  NA 135 136 133* 133* 133* --  K 4.3 5.3* 4.6 3.7 4.2 --  CL 106 108 104 102 101 --  CO2 19 18* 18* 19 20 --  GLUCOSE 119* 135* 126* 149* 132* --  BUN 8 11 11 11 11  --  CREATININE 0.93 0.96 0.99 1.11 1.16 --  CALCIUM 8.8 8.5 8.6 9.0 8.5 --  MG -- 2.0 -- -- -- 1.7  PHOS -- -- -- -- -- --   Liver Function Tests: No results found for this basename: AST:5,ALT:5,ALKPHOS:5,BILITOT:5,PROT:5,ALBUMIN:5 in the last 168 hours No results found for this basename: LIPASE:5,AMYLASE:5 in the last 168 hours No results found for this basename: AMMONIA:5 in the last 168 hours CBC:  Lab 11/18/11 0955 11/16/11 0415 11/15/11 0315 11/14/11 0315  WBC 6.2 9.6 11.1* 8.5  NEUTROABS -- 6.6 -- 6.6  HGB 10.9* 12.3* 11.3* 12.4*  HCT 31.2* 36.1* 32.6* 35.5*  MCV 85.0 84.7 84.0 84.5  PLT 359  368 333 311   Cardiac Enzymes: No results found for this basename: CKTOTAL:5,CKMB:5,CKMBINDEX:5,TROPONINI:5 in the last 168 hours BNP: BNP (last 3 results) No results found for this basename: PROBNP:3 in the last 8760 hours CBG:  Lab 11/19/11 0904 11/19/11 0803 11/18/11 2130 11/18/11 1703 11/18/11 1233  GLUCAP 183* 52* 148* 158* 198*  Time coordinating discharge: >50 minutes  Signed:  Penny Pia  [facility] 11/19/2011, 10:55 AM

## 2011-11-19 NOTE — Progress Notes (Signed)
cbg 52, patient has breakfast tray and is eating will continue to monitor.

## 2011-12-06 ENCOUNTER — Telehealth: Payer: Self-pay | Admitting: Radiation Oncology

## 2011-12-06 NOTE — Telephone Encounter (Signed)
Pt's dau Alcario Drought)  came by with some FMLA papers and advised they have to be faxed by tmrw or she may be terminated from her employer. She said the reason it took so long for her to bring them, is because, orig she took them to his surgeon doc and they kept them for apprx 5 days and then told her she will have to get his rad doc to complete them.  Dr. Kathrynn Running completed papers and they were faxed to her on her job @ [phone]

## 2011-12-19 ENCOUNTER — Emergency Department (HOSPITAL_COMMUNITY)
Admission: EM | Admit: 2011-12-19 | Discharge: 2011-12-19 | Discharge status: Against Medical Advice | Payer: Medicare Other | Attending: Emergency Medicine | Admitting: Emergency Medicine

## 2011-12-19 ENCOUNTER — Encounter (HOSPITAL_COMMUNITY): Payer: Self-pay | Admitting: *Deleted

## 2011-12-19 DIAGNOSIS — R109 Unspecified abdominal pain: Secondary | ICD-10-CM | POA: Insufficient documentation

## 2011-12-19 DIAGNOSIS — R339 Retention of urine, unspecified: Secondary | ICD-10-CM | POA: Insufficient documentation

## 2011-12-19 LAB — GLUCOSE, CAPILLARY: Glucose-Capillary: 234 mg/dL — ABNORMAL HIGH (ref 70–99)

## 2011-12-19 NOTE — ED Notes (Signed)
Pt states he hasnt had urine in leg bag in over an hour. Pt has had catheter for a month due to urinary retention and UTI.

## 2011-12-19 NOTE — ED Notes (Addendum)
Pt ambulated to the BR to try to urinate-after a few minutes, pt came out with cloudy urine in specimen cup, pt then reports feeling much better.  Pt able to stand up straight-denies any pain at this time.  Wants to leave without being seen by MD.

## 2011-12-19 NOTE — ED Notes (Addendum)
Pt reports going to [facility] Monday to have his foley catheter taken out, reports sediments in his leg bag so he was told to come back in 2 weeks.  Pt reports today this afternoon he started to have pelvic pressure/pain and urinary retention.  Pt also reports that he has Radioactive seeds in place for pancreatic cancer.  Pt guarding his pelvis area, reports pressure.  Pt also unable to sit or stand up straight.

## 2012-01-13 ENCOUNTER — Emergency Department (HOSPITAL_COMMUNITY)
Admission: EM | Admit: 2012-01-13 | Discharge: 2012-01-13 | Disposition: A | Discharge status: Home / Self Care | Payer: Medicare Other | Attending: Emergency Medicine | Admitting: Emergency Medicine

## 2012-01-13 DIAGNOSIS — K219 Gastro-esophageal reflux disease without esophagitis: Secondary | ICD-10-CM | POA: Insufficient documentation

## 2012-01-13 DIAGNOSIS — M129 Arthropathy, unspecified: Secondary | ICD-10-CM | POA: Insufficient documentation

## 2012-01-13 DIAGNOSIS — G8929 Other chronic pain: Secondary | ICD-10-CM | POA: Insufficient documentation

## 2012-01-13 DIAGNOSIS — E119 Type 2 diabetes mellitus without complications: Secondary | ICD-10-CM | POA: Insufficient documentation

## 2012-01-13 DIAGNOSIS — Z8546 Personal history of malignant neoplasm of prostate: Secondary | ICD-10-CM | POA: Insufficient documentation

## 2012-01-13 DIAGNOSIS — Z87891 Personal history of nicotine dependence: Secondary | ICD-10-CM | POA: Insufficient documentation

## 2012-01-13 DIAGNOSIS — Z466 Encounter for fitting and adjustment of urinary device: Secondary | ICD-10-CM | POA: Insufficient documentation

## 2012-01-13 NOTE — ED Provider Notes (Signed)
History     CSN: [id number]  Arrival date & time 01/13/12  1950   First MD Initiated Contact with Patient 01/13/12 2115      No chief complaint on file.   (Consider location/radiation/quality/duration/timing/severity/associated sxs/prior treatment) HPI Comments: 61 year old male presents to the emergency department needing a replacement for his urinary catheter bag. His current bag has a hole in it. Denies any other issues. Denies any pain, swelling or her redness around insertion of catheter. Denies fever or chills.  The history is provided by the patient.    Past Medical History  Diagnosis Date  . ED (erectile dysfunction)   . Mild asthma SEASONAL -  PT HAS NO INHALER  . Diabetes mellitus ORAL MEDS    TYPE II  . Prostate cancer 05/01/2011 [facility]    ADENOCARCINOMA,gLEASON= 3+3=6,VOLUNE=68.8CC ,PSA=7.40  . Arthritis LUMBAR BACK, SHOULDER, LEFT KNEE, RIGHT WRIST  . DJD (degenerative joint disease)   . Chronic joint pain   . GERD (gastroesophageal reflux disease)   . Seasonal allergies   . 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  1997 (15 YRS AGO)    STATES NO DRUG USE SINCE  . History of head injury 2010-  CLOSED  W/ CONCUSSION--   NO RESIDUAL  . Eczema     Past Surgical History  Procedure Date  . Rotator cuff repair 01-10-2004    RIGHT  . Radioactive seed implant 09/07/2011    Procedure: RADIOACTIVE SEED IMPLANT;  Surgeon: Garnett Farm, MD;  Location: [facility];  Service: Urology;  Laterality: N/A;  86  seeds implanted  . Cystoscopy 09/07/2011    Procedure: CYSTOSCOPY;  Surgeon: Garnett Farm, MD;  Location: [facility];  Service: Urology;  Laterality: N/A;  no seeds noted in bladder    Family History  Problem Relation Age of Onset  . Prostate cancer Maternal Uncle     History  Substance Use Topics  . Smoking status: Former Smoker    Types: Cigarettes    Quit date: 09/02/1973  .  Smokeless tobacco: Never Used   Comment: QUIT SMOKING 1970'S  . Alcohol Use: No      Review of Systems  Constitutional: Negative for fever and chills.  Respiratory: Negative for shortness of breath.   Cardiovascular: Negative for chest pain.  Gastrointestinal: Negative for nausea and vomiting.  Genitourinary: Negative for penile swelling and penile pain.  Musculoskeletal: Negative for back pain.  Skin: Negative for color change and rash.  Psychiatric/Behavioral: Negative for confusion.    Allergies  Review of patient's allergies indicates no known allergies.  Home Medications   Current Outpatient Rx  Name Route Sig Dispense Refill  . ASPIRIN 81 MG PO CHEW Oral Chew 81 mg by mouth daily.    . ADULT MULTIVITAMIN W/MINERALS CH Oral Take 1 tablet by mouth daily.    Marland Kitchen SIMETHICONE 80 MG PO CHEW Oral Chew 80 mg by mouth every 6 (six) hours as needed.     Marland Kitchen GLIMEPIRIDE 4 MG PO TABS Oral Take 4 mg by mouth daily before breakfast.    . METOPROLOL TARTRATE 50 MG PO TABS Oral Take 50 mg by mouth 2 (two) times daily.      BP 123/61  Pulse 73  Temp 98.3 F (36.8 C) (Oral)  Resp 20  SpO2 99%  Physical Exam  Nursing note and vitals reviewed. Constitutional: He is oriented to person, place, and time. He appears well-developed and  well-nourished. No distress.  HENT:  Head: Normocephalic and atraumatic.  Eyes: Conjunctivae normal are normal.  Neck: Normal range of motion.  Cardiovascular: Normal rate, regular rhythm and normal heart sounds.   Pulmonary/Chest: Effort normal and breath sounds normal.  Abdominal: Soft. Bowel sounds are normal. There is no tenderness.  Genitourinary:       Urinary catheter intact  Musculoskeletal: Normal range of motion.  Neurological: He is alert and oriented to person, place, and time.  Skin: Skin is warm and dry. No erythema.  Psychiatric: He has a normal mood and affect. His behavior is normal.    ED Course  Procedures (including critical care  time)  Labs Reviewed - No data to display No results found.  Dx- encounter for urinary catheter bag replacement  MDM  61 year old male needing a replacement bad for his urinary catheter. Bag has been replaced. He is denying any other symptoms and in no apparent distress.        Trevor Mace, PA-C 01/13/12 2204

## 2012-01-13 NOTE — ED Notes (Signed)
Patient is alert and oriented x3.  He is only here for cath bag replacement. Current bag has a hole in it

## 2012-01-13 NOTE — Discharge Instructions (Signed)
Followup with your doctor to let him know that he needed a new catheter bag.

## 2012-01-13 NOTE — ED Notes (Signed)
Patient is alert and oriented x3.  He was given DC instructions and follow up visit instructions.  Patient gave verbal understanding.  He was DC ambulatory under his own power to home.  V/S stable.  He was not showing any signs of distress on DC 

## 2012-01-14 ENCOUNTER — Encounter (HOSPITAL_COMMUNITY): Payer: Self-pay | Admitting: Emergency Medicine

## 2012-01-14 ENCOUNTER — Emergency Department (HOSPITAL_COMMUNITY)
Admission: EM | Admit: 2012-01-14 | Discharge: 2012-01-14 | Disposition: A | Discharge status: Home / Self Care | Payer: Medicare Other | Attending: Emergency Medicine | Admitting: Emergency Medicine

## 2012-01-14 DIAGNOSIS — Z87891 Personal history of nicotine dependence: Secondary | ICD-10-CM | POA: Insufficient documentation

## 2012-01-14 DIAGNOSIS — Y846 Urinary catheterization as the cause of abnormal reaction of the patient, or of later complication, without mention of misadventure at the time of the procedure: Secondary | ICD-10-CM | POA: Insufficient documentation

## 2012-01-14 DIAGNOSIS — T8389XA Other specified complication of genitourinary prosthetic devices, implants and grafts, initial encounter: Secondary | ICD-10-CM | POA: Insufficient documentation

## 2012-01-14 DIAGNOSIS — E119 Type 2 diabetes mellitus without complications: Secondary | ICD-10-CM | POA: Insufficient documentation

## 2012-01-14 DIAGNOSIS — C61 Malignant neoplasm of prostate: Secondary | ICD-10-CM | POA: Insufficient documentation

## 2012-01-14 DIAGNOSIS — K219 Gastro-esophageal reflux disease without esophagitis: Secondary | ICD-10-CM | POA: Insufficient documentation

## 2012-01-14 DIAGNOSIS — J45909 Unspecified asthma, uncomplicated: Secondary | ICD-10-CM | POA: Insufficient documentation

## 2012-01-14 DIAGNOSIS — T83091A Other mechanical complication of indwelling urethral catheter, initial encounter: Secondary | ICD-10-CM

## 2012-01-14 DIAGNOSIS — Z7982 Long term (current) use of aspirin: Secondary | ICD-10-CM | POA: Insufficient documentation

## 2012-01-14 NOTE — ED Provider Notes (Signed)
History     CSN: [id number]  Arrival date & time 01/14/12  0005   First MD Initiated Contact with Patient 01/14/12 0112      Chief Complaint  Patient presents with  . Urinary Retention    (Consider location/radiation/quality/duration/timing/severity/associated sxs/prior treatment) The history is provided by the patient.   patient presents with urinary retention. He's had his Foley in for 6 weeks placed 3 weeks ago. At around 4 PM today he would not drink anymore. Pain was severe. It is in some flexion patient feels much better. He is worried about infection. He states he's currently on antibiotics from his urologist. He states he is due to have a test on it from his urologist on Thursday.  Past Medical History  Diagnosis Date  . ED (erectile dysfunction)   . Mild asthma SEASONAL -  PT HAS NO INHALER  . Diabetes mellitus ORAL MEDS    TYPE II  . Prostate cancer 05/01/2011 BX    ADENOCARCINOMA,gLEASON= 3+3=6,VOLUNE=68.8CC ,PSA=7.40  . Arthritis LUMBAR BACK, SHOULDER, LEFT KNEE, RIGHT WRIST  . DJD (degenerative joint disease)   . Chronic joint pain   . GERD (gastroesophageal reflux disease)   . Seasonal allergies   . 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  1997 (15 YRS AGO)    STATES NO DRUG USE SINCE  . History of head injury 2010-  CLOSED  W/ CONCUSSION--   NO RESIDUAL  . Eczema     Past Surgical History  Procedure Date  . Rotator cuff repair 01-10-2004    RIGHT  . Radioactive seed implant 09/07/2011    Procedure: RADIOACTIVE SEED IMPLANT;  Surgeon: Garnett Farm, MD;  Location: [facility];  Service: Urology;  Laterality: N/A;  84  seeds implanted  . Cystoscopy 09/07/2011    Procedure: CYSTOSCOPY;  Surgeon: Garnett Farm, MD;  Location: [facility];  Service: Urology;  Laterality: N/A;  no seeds noted in bladder    Family History  Problem Relation Age of Onset  . Prostate cancer Maternal Uncle      History  Substance Use Topics  . Smoking status: Former Smoker    Types: Cigarettes    Quit date: 09/02/1973  . Smokeless tobacco: Never Used   Comment: QUIT SMOKING 1970'S  . Alcohol Use: No      Review of Systems  Constitutional: Positive for diaphoresis. Negative for activity change and appetite change.  HENT: Negative for neck stiffness.   Eyes: Negative for pain.  Respiratory: Negative for chest tightness and shortness of breath.   Cardiovascular: Negative for chest pain and leg swelling.  Gastrointestinal: Positive for abdominal pain. Negative for nausea, vomiting and diarrhea.  Genitourinary: Positive for decreased urine volume. Negative for flank pain.  Musculoskeletal: Negative for back pain.  Skin: Negative for rash.  Neurological: Negative for weakness, numbness and headaches.  Psychiatric/Behavioral: Negative for behavioral problems.    Allergies  Review of patient's allergies indicates no known allergies.  Home Medications   Current Outpatient Rx  Name Route Sig Dispense Refill  . ASPIRIN 81 MG PO CHEW Oral Chew 81 mg by mouth daily.    . ADULT MULTIVITAMIN W/MINERALS CH Oral Take 1 tablet by mouth daily.    Marland Kitchen GLIMEPIRIDE 4 MG PO TABS Oral Take 4 mg by mouth daily before breakfast.    . METOPROLOL TARTRATE 50 MG PO TABS Oral Take 50 mg by mouth 2 (two) times daily.    Marland Kitchen  SIMETHICONE 80 MG PO CHEW Oral Chew 80 mg by mouth every 6 (six) hours as needed.       BP 147/96  Pulse 102  Temp 97.8 F (36.6 C) (Oral)  Resp 24  Ht 5\' 7"  (1.702 m)  Wt 193 lb (87.544 kg)  BMI 30.23 kg/m2  SpO2 100%  Physical Exam  Constitutional: He appears well-developed and well-nourished.  HENT:  Head: Normocephalic.  Eyes: Pupils are equal, round, and reactive to light.  Abdominal: There is no tenderness. There is no rebound and no guarding.  Genitourinary:       Foley catheter in place.  Neurological: He is alert.    ED Course  Procedures (including critical  care time)   Labs Reviewed  URINE CULTURE   No results found.   1. Obstructed Foley catheter       MDM  Obstructed Foley catheter improved with flushing. It is better placed. Urine culture sent the patient not be treated at this time. Urology will follow        Juliet Rude. Rubin Payor, MD 01/14/12 1610

## 2012-01-14 NOTE — ED Notes (Signed)
Pt reports his catheter was inserted three weeks ago and has come occulted and has not drained any for three hours. Pt 10/10 pain, diaphoretic, and pacing. Denies N/V.

## 2012-01-14 NOTE — ED Notes (Signed)
Pt reports that the bag was changed and flushed upon arrival; now draining yellow urine with large sediment noted to it.

## 2012-01-17 ENCOUNTER — Emergency Department (HOSPITAL_COMMUNITY)
Admission: EM | Admit: 2012-01-17 | Discharge: 2012-01-17 | Disposition: A | Discharge status: Home / Self Care | Payer: Medicare Other | Attending: Emergency Medicine | Admitting: Emergency Medicine

## 2012-01-17 ENCOUNTER — Encounter (HOSPITAL_COMMUNITY): Payer: Self-pay | Admitting: *Deleted

## 2012-01-17 DIAGNOSIS — R339 Retention of urine, unspecified: Secondary | ICD-10-CM

## 2012-01-17 DIAGNOSIS — Z87891 Personal history of nicotine dependence: Secondary | ICD-10-CM | POA: Insufficient documentation

## 2012-01-17 DIAGNOSIS — B3749 Other urogenital candidiasis: Secondary | ICD-10-CM | POA: Insufficient documentation

## 2012-01-17 DIAGNOSIS — M129 Arthropathy, unspecified: Secondary | ICD-10-CM | POA: Insufficient documentation

## 2012-01-17 DIAGNOSIS — Z8546 Personal history of malignant neoplasm of prostate: Secondary | ICD-10-CM | POA: Insufficient documentation

## 2012-01-17 DIAGNOSIS — B3741 Candidal cystitis and urethritis: Secondary | ICD-10-CM

## 2012-01-17 DIAGNOSIS — E119 Type 2 diabetes mellitus without complications: Secondary | ICD-10-CM | POA: Insufficient documentation

## 2012-01-17 DIAGNOSIS — R338 Other retention of urine: Secondary | ICD-10-CM | POA: Insufficient documentation

## 2012-01-17 DIAGNOSIS — R739 Hyperglycemia, unspecified: Secondary | ICD-10-CM

## 2012-01-17 DIAGNOSIS — K219 Gastro-esophageal reflux disease without esophagitis: Secondary | ICD-10-CM | POA: Insufficient documentation

## 2012-01-17 LAB — URINALYSIS, ROUTINE W REFLEX MICROSCOPIC
Bilirubin Urine: NEGATIVE
Ketones, ur: NEGATIVE mg/dL
Nitrite: NEGATIVE
Protein, ur: 30 mg/dL — AB
pH: 5.5 (ref 5.0–8.0)

## 2012-01-17 LAB — GLUCOSE, CAPILLARY

## 2012-01-17 LAB — URINE CULTURE: Colony Count: >=100000

## 2012-01-17 LAB — POCT I-STAT, CHEM 8
BUN: 33 mg/dL — ABNORMAL HIGH (ref 6–23)
Hemoglobin: 12.2 g/dL — ABNORMAL LOW (ref 13.0–17.0)
Potassium: 4.2 mEq/L (ref 3.5–5.1)
Sodium: 140 mEq/L (ref 135–145)
TCO2: 18 mmol/L (ref 0–100)

## 2012-01-17 LAB — URINE MICROSCOPIC-ADD ON

## 2012-01-17 MED ORDER — FLUCONAZOLE 200 MG PO TABS
200.0000 mg | ORAL_TABLET | Freq: Once | ORAL | Status: AC
  Administered 2012-01-17: 200 mg via ORAL
  Filled 2012-01-17: qty 1

## 2012-01-17 MED ORDER — FLUCONAZOLE 200 MG PO TABS: 200.0000 mg | ORAL_TABLET | Freq: Every day | ORAL | Status: AC

## 2012-01-17 NOTE — ED Notes (Signed)
Voiced understanding of instructions given 

## 2012-01-17 NOTE — ED Notes (Signed)
CBG at this time = 305.  Glucometer will not accept pt's info on scan.

## 2012-01-17 NOTE — ED Notes (Signed)
Pt presents with urinary catheter that hasn't drained in 90 mins; severe abd pain

## 2012-01-17 NOTE — ED Provider Notes (Signed)
Medical screening examination/treatment/procedure(s) were performed by non-physician practitioner and as supervising physician I was immediately available for consultation/collaboration.   Charles B. Bernette Mayers, MD 01/17/12 2110

## 2012-01-17 NOTE — ED Provider Notes (Signed)
History     CSN: [id number]  Arrival date & time 01/17/12  0608   First MD Initiated Contact with Patient 01/17/12 [phone]      Chief Complaint  Patient presents with  . Urinary Retention    (Consider location/radiation/quality/duration/timing/severity/associated sxs/prior treatment) HPI Comments: Cole Conrad is a 61 y.o. Male who presents with complaint of foley catheter not draining. States has had foley for just over a month now. Followed by Dr. Warner Mccreedy and is scheduled for procedure today. States around 4am this morning, his foley catheter stopped draining. Pt states this happened 3 days ago as well and had to have his catheter changed. States he is on the 2nd round of antibiotics at this time, taking clindamycin, finished clindamycin prior to that. Admits to abdominal pain prior to coming in, feeling like his bladder is full. Denies any blood in his urine. Denies fever, chills, nausea, vomiting.    Past Medical History  Diagnosis Date  . ED (erectile dysfunction)   . Mild asthma SEASONAL -  PT HAS NO INHALER  . Diabetes mellitus ORAL MEDS    TYPE II  . Prostate cancer 05/01/2011 BX    ADENOCARCINOMA,gLEASON= 3+3=6,VOLUNE=68.8CC ,PSA=7.40  . Arthritis LUMBAR BACK, SHOULDER, LEFT KNEE, RIGHT WRIST  . DJD (degenerative joint disease)   . Chronic joint pain   . GERD (gastroesophageal reflux disease)   . Seasonal allergies   . 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  1997 (15 YRS AGO)    STATES NO DRUG USE SINCE  . History of head injury 2010-  CLOSED  W/ CONCUSSION--   NO RESIDUAL  . Eczema     Past Surgical History  Procedure Date  . Rotator cuff repair 01-10-2004    RIGHT  . Radioactive seed implant 09/07/2011    Procedure: RADIOACTIVE SEED IMPLANT;  Surgeon: Garnett Farm, MD;  Location: [facility];  Service: Urology;  Laterality: N/A;  84  seeds implanted  . Cystoscopy 09/07/2011    Procedure: CYSTOSCOPY;   Surgeon: Garnett Farm, MD;  Location: [facility];  Service: Urology;  Laterality: N/A;  no seeds noted in bladder    Family History  Problem Relation Age of Onset  . Prostate cancer Maternal Uncle     History  Substance Use Topics  . Smoking status: Former Smoker    Types: Cigarettes    Quit date: 09/02/1973  . Smokeless tobacco: Never Used   Comment: QUIT SMOKING 1970'S  . Alcohol Use: No      Review of Systems  Constitutional: Negative for fever and chills.  Respiratory: Negative.   Cardiovascular: Negative.   Gastrointestinal: Positive for abdominal pain. Negative for nausea, vomiting, diarrhea and constipation.  Genitourinary: Positive for urgency and difficulty urinating. Negative for hematuria, penile swelling, scrotal swelling, genital sores, penile pain and testicular pain.  Musculoskeletal: Negative.   Skin: Negative.   Neurological: Negative for dizziness, weakness and headaches.  Hematological: Negative.     Allergies  Review of patient's allergies indicates no known allergies.  Home Medications   Current Outpatient Rx  Name Route Sig Dispense Refill  . ASPIRIN 81 MG PO CHEW Oral Chew 81 mg by mouth daily.    Marland Kitchen CLINDAMYCIN HCL 300 MG PO CAPS Oral Take 300 mg by mouth 2 (two) times daily.    Marland Kitchen GLIMEPIRIDE 4 MG PO TABS Oral Take 4 mg by mouth daily before breakfast.    . METOPROLOL TARTRATE  50 MG PO TABS Oral Take 50 mg by mouth 2 (two) times daily.    . ADULT MULTIVITAMIN W/MINERALS CH Oral Take 1 tablet by mouth daily.    Marland Kitchen SIMETHICONE 80 MG PO CHEW Oral Chew 80 mg by mouth every 6 (six) hours as needed.       BP 133/81  Pulse 71  Temp 98.5 F (36.9 C) (Oral)  Resp 22  SpO2 100%  Physical Exam  Nursing note and vitals reviewed. Constitutional: He appears well-developed and well-nourished. No distress.  HENT:  Head: Normocephalic.  Eyes: Conjunctivae normal are normal.  Neck: Neck supple.  Cardiovascular: Normal rate, regular  rhythm and normal heart sounds.   Pulmonary/Chest: Effort normal and breath sounds normal. No respiratory distress. He has no wheezes. He has no rales.  Abdominal: Soft. Bowel sounds are normal. He exhibits no distension. There is no tenderness. There is no rebound.       No CVA tenderness  Musculoskeletal: He exhibits no edema.  Neurological: He is alert.  Skin: Skin is warm and dry.  Psychiatric: He has a normal mood and affect.    ED Course  Procedures (including critical care time)  When I saw pt, he already had foley replaced by an RN, and it is draining well, 300cc out, urine yellow with white floating particles. I reviewed cultures from urine obtained at last visit, which grew <100,000 colonies of yeast. I will start pt on diflucan with follow up with Dr. Meliton Rattan today. He has no abdominal pain  Or tenderness at this time.   UA (did not cross over)  Negative nitrite, moderate leuk est, 11-20 WBCs, 7-10 RBCs, few bacteria, rare epithelial cells, rare yeast Filed Vitals:   01/17/12 0625  BP: 133/81  Pulse: 71  Temp: 98.5 F (36.9 C)  Resp: 22   Chem 8 ran, normal renal function. Glucose 300, no signs of dka, normal electrolytes. will need recheck and follow up.   1. Urinary retention   2. Yeast cystitis   3. Hyperglycemia       MDM          Lottie Mussel, PA 01/17/12 0830

## 2012-01-18 LAB — URINE CULTURE: Colony Count: 15000

## 2012-01-19 NOTE — ED Provider Notes (Signed)
Medical screening examination/treatment/procedure(s) were performed by non-physician practitioner and as supervising physician I was immediately available for consultation/collaboration.  John-Adam Lalanya Rufener, M.D.     John-Adam Louvina Cleary, MD 01/19/12 0902 

## 2012-01-19 NOTE — ED Notes (Signed)
+  Urine. Patient treated with Diflucan. Per protocol MD.

## 2012-02-18 ENCOUNTER — Encounter: Payer: Self-pay | Admitting: *Deleted

## 2012-05-26 ENCOUNTER — Emergency Department (HOSPITAL_COMMUNITY)
Admission: EM | Admit: 2012-05-26 | Discharge: 2012-05-26 | Disposition: A | Discharge status: Home / Self Care | Payer: Medicare Other | Attending: Emergency Medicine | Admitting: Emergency Medicine

## 2012-05-26 ENCOUNTER — Encounter (HOSPITAL_COMMUNITY): Payer: Self-pay | Admitting: Emergency Medicine

## 2012-05-26 DIAGNOSIS — Z872 Personal history of diseases of the skin and subcutaneous tissue: Secondary | ICD-10-CM | POA: Insufficient documentation

## 2012-05-26 DIAGNOSIS — Z8546 Personal history of malignant neoplasm of prostate: Secondary | ICD-10-CM | POA: Insufficient documentation

## 2012-05-26 DIAGNOSIS — Y838 Other surgical procedures as the cause of abnormal reaction of the patient, or of later complication, without mention of misadventure at the time of the procedure: Secondary | ICD-10-CM | POA: Insufficient documentation

## 2012-05-26 DIAGNOSIS — R300 Dysuria: Secondary | ICD-10-CM | POA: Insufficient documentation

## 2012-05-26 DIAGNOSIS — F1410 Cocaine abuse, uncomplicated: Secondary | ICD-10-CM | POA: Insufficient documentation

## 2012-05-26 DIAGNOSIS — Z79899 Other long term (current) drug therapy: Secondary | ICD-10-CM | POA: Insufficient documentation

## 2012-05-26 DIAGNOSIS — Z8739 Personal history of other diseases of the musculoskeletal system and connective tissue: Secondary | ICD-10-CM | POA: Insufficient documentation

## 2012-05-26 DIAGNOSIS — Z87448 Personal history of other diseases of urinary system: Secondary | ICD-10-CM | POA: Insufficient documentation

## 2012-05-26 DIAGNOSIS — E119 Type 2 diabetes mellitus without complications: Secondary | ICD-10-CM | POA: Insufficient documentation

## 2012-05-26 DIAGNOSIS — F1210 Cannabis abuse, uncomplicated: Secondary | ICD-10-CM | POA: Insufficient documentation

## 2012-05-26 DIAGNOSIS — J45909 Unspecified asthma, uncomplicated: Secondary | ICD-10-CM | POA: Insufficient documentation

## 2012-05-26 DIAGNOSIS — Z87820 Personal history of traumatic brain injury: Secondary | ICD-10-CM | POA: Insufficient documentation

## 2012-05-26 DIAGNOSIS — Z792 Long term (current) use of antibiotics: Secondary | ICD-10-CM | POA: Insufficient documentation

## 2012-05-26 DIAGNOSIS — Z8719 Personal history of other diseases of the digestive system: Secondary | ICD-10-CM | POA: Insufficient documentation

## 2012-05-26 DIAGNOSIS — Z7982 Long term (current) use of aspirin: Secondary | ICD-10-CM | POA: Insufficient documentation

## 2012-05-26 DIAGNOSIS — Z8709 Personal history of other diseases of the respiratory system: Secondary | ICD-10-CM | POA: Insufficient documentation

## 2012-05-26 DIAGNOSIS — T83091A Other mechanical complication of indwelling urethral catheter, initial encounter: Secondary | ICD-10-CM | POA: Insufficient documentation

## 2012-05-26 DIAGNOSIS — Z87891 Personal history of nicotine dependence: Secondary | ICD-10-CM | POA: Insufficient documentation

## 2012-05-26 LAB — URINALYSIS, ROUTINE W REFLEX MICROSCOPIC
Glucose, UA: NEGATIVE mg/dL
Nitrite: NEGATIVE
Specific Gravity, Urine: 1.004 — ABNORMAL LOW (ref 1.005–1.030)
pH: 7 (ref 5.0–8.0)

## 2012-05-26 LAB — URINE MICROSCOPIC-ADD ON

## 2012-05-26 MED ORDER — LIDOCAINE HCL 2 % EX GEL
Freq: Once | CUTANEOUS | Status: AC
  Administered 2012-05-26: 20 via URETHRAL

## 2012-05-26 NOTE — ED Notes (Signed)
Instructions given on the use of the leg bag, voiced understanding.

## 2012-05-26 NOTE — ED Provider Notes (Signed)
History     CSN: [id number]  Arrival date & time 05/26/12  1632   First MD Initiated Contact with Patient 05/26/12 1740      Chief Complaint  Patient presents with  . Urinary Retention    (Consider location/radiation/quality/duration/timing/severity/associated sxs/prior treatment) The history is provided by the patient. No language interpreter was used.  cc:  62yo male here c/o urinary retention from his foley catheter off and on in the last 24 hours. He has had the catheter since last August.  The Foley was actually changed out one month ago in the ER for the same reason.  Patient has had problems with the Foley clotting off in the past. He is on antibiotics chronically for UTIs. Past medical history diabetes, prostate cancer, arthritis, GERD, close head injury, and ED. Goes to Dr. Warner Mccreedy.     Past Medical History  Diagnosis Date  . ED (erectile dysfunction)   . Mild asthma SEASONAL -  PT HAS NO INHALER  . Diabetes mellitus ORAL MEDS    TYPE II  . Prostate cancer 05/01/2011 BX    ADENOCARCINOMA,gLEASON= 3+3=6,VOLUNE=68.8CC ,PSA=7.40  . Arthritis LUMBAR BACK, SHOULDER, LEFT KNEE, RIGHT WRIST  . DJD (degenerative joint disease)   . Chronic joint pain   . GERD (gastroesophageal reflux disease)   . Seasonal allergies   . 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  1997 (15 YRS AGO)    STATES NO DRUG USE SINCE  . History of head injury 2010-  CLOSED  W/ CONCUSSION--   NO RESIDUAL  . Eczema     Past Surgical History  Procedure Laterality Date  . Rotator cuff repair  01-10-2004    RIGHT  . Radioactive seed implant  09/07/2011    Procedure: RADIOACTIVE SEED IMPLANT;  Surgeon: Garnett Farm, MD;  Location: [facility];  Service: Urology;  Laterality: N/A;  84  seeds implanted  . Cystoscopy  09/07/2011    Procedure: CYSTOSCOPY;  Surgeon: Garnett Farm, MD;  Location: [facility];  Service: Urology;   Laterality: N/A;  no seeds noted in bladder    Family History  Problem Relation Age of Onset  . Prostate cancer Maternal Uncle     History  Substance Use Topics  . Smoking status: Former Smoker    Types: Cigarettes    Quit date: 09/02/1973  . Smokeless tobacco: Never Used     Comment: QUIT SMOKING 1970'S  . Alcohol Use: No      Review of Systems  Constitutional: Negative.   HENT: Negative.   Eyes: Negative.   Respiratory: Negative.   Cardiovascular: Negative.   Gastrointestinal: Positive for abdominal distention. Negative for nausea and vomiting.       Suprapubic  Genitourinary: Positive for dysuria. Negative for hematuria and flank pain.  Musculoskeletal: Negative for back pain.  Neurological: Negative.   Psychiatric/Behavioral: Negative.   All other systems reviewed and are negative.    Allergies  Review of patient's allergies indicates no known allergies.  Home Medications   Current Outpatient Rx  Name  Route  Sig  Dispense  Refill  . aspirin 81 MG chewable tablet   Oral   Chew 81 mg by mouth daily.         . clindamycin (CLEOCIN) 300 MG capsule   Oral   Take 300 mg by mouth 2 (two) times daily.         Marland Kitchen glimepiride (AMARYL) 4 MG  tablet   Oral   Take 4 mg by mouth daily before breakfast.         . metoprolol (LOPRESSOR) 50 MG tablet   Oral   Take 50 mg by mouth 2 (two) times daily.         . Multiple Vitamin (MULTIVITAMIN WITH MINERALS) TABS   Oral   Take 1 tablet by mouth daily.         Marland Kitchen triamcinolone (KENALOG) 0.025 % ointment   Topical   Apply 1 application topically 2 (two) times daily.           BP 157/86  Pulse 86  Temp(Src) 98.4 F (36.9 C) (Oral)  Resp 16  SpO2 99%  Physical Exam  Nursing note and vitals reviewed. Constitutional: He is oriented to person, place, and time. He appears well-developed and well-nourished.  HENT:  Head: Normocephalic.  Eyes: Conjunctivae and EOM are normal. Pupils are equal, round,  and reactive to light.  Neck: Normal range of motion. Neck supple.  Cardiovascular: Normal rate.   Pulmonary/Chest: Effort normal and breath sounds normal. No respiratory distress.  Abdominal: Soft. He exhibits distension. He exhibits no mass. There is tenderness. There is no rebound and no guarding.  Suprapubic  Genitourinary: Penis normal.  Musculoskeletal: Normal range of motion.  Neurological: He is alert and oriented to person, place, and time.  Skin: Skin is warm and dry.  Psychiatric: He has a normal mood and affect.    ED Course  Procedures (including critical care time) Foley flushed then drained clear yellow urine.  Foley catheter changed. U/a sent.   Labs Reviewed  URINALYSIS, ROUTINE W REFLEX MICROSCOPIC   No results found.   No diagnosis found.    MDM  Clotted off foley catheter.  Distension and pain relief after foley changed out.  Will follow up with Dr. Warner Mccreedy.  Continue antibiotics.  Culture pending.          Remi Haggard, NP 05/27/12 0130

## 2012-05-26 NOTE — ED Notes (Signed)
Pt sts urinary retention today and only putting out very little amount of urine from foley; pt sts abd pain and distention; pt sts last foley change in January

## 2012-05-27 NOTE — ED Provider Notes (Signed)
Medical screening examination/treatment/procedure(s) were performed by non-physician practitioner and as supervising physician I was immediately available for consultation/collaboration.    Vida Roller, MD 05/27/12 [phone]

## 2012-05-28 LAB — URINE CULTURE

## 2012-05-29 ENCOUNTER — Telehealth (HOSPITAL_COMMUNITY): Payer: Self-pay | Admitting: Emergency Medicine

## 2012-05-29 NOTE — ED Notes (Addendum)
Positive urnc- chart reviewed. Chart sent to EDP office for review.

## 2012-06-01 ENCOUNTER — Telehealth (HOSPITAL_COMMUNITY): Payer: Self-pay | Admitting: Emergency Medicine

## 2012-06-15 ENCOUNTER — Emergency Department (HOSPITAL_COMMUNITY)
Admission: EM | Admit: 2012-06-15 | Discharge: 2012-06-15 | Disposition: A | Discharge status: Home / Self Care | Payer: Medicare Other | Attending: Emergency Medicine | Admitting: Emergency Medicine

## 2012-06-15 ENCOUNTER — Encounter (HOSPITAL_COMMUNITY): Payer: Self-pay | Admitting: Emergency Medicine

## 2012-06-15 DIAGNOSIS — M2550 Pain in unspecified joint: Secondary | ICD-10-CM | POA: Insufficient documentation

## 2012-06-15 DIAGNOSIS — M129 Arthropathy, unspecified: Secondary | ICD-10-CM | POA: Insufficient documentation

## 2012-06-15 DIAGNOSIS — Z87891 Personal history of nicotine dependence: Secondary | ICD-10-CM | POA: Insufficient documentation

## 2012-06-15 DIAGNOSIS — Z7982 Long term (current) use of aspirin: Secondary | ICD-10-CM | POA: Insufficient documentation

## 2012-06-15 DIAGNOSIS — Z79899 Other long term (current) drug therapy: Secondary | ICD-10-CM | POA: Insufficient documentation

## 2012-06-15 DIAGNOSIS — F1911 Other psychoactive substance abuse, in remission: Secondary | ICD-10-CM | POA: Insufficient documentation

## 2012-06-15 DIAGNOSIS — Y846 Urinary catheterization as the cause of abnormal reaction of the patient, or of later complication, without mention of misadventure at the time of the procedure: Secondary | ICD-10-CM | POA: Insufficient documentation

## 2012-06-15 DIAGNOSIS — Z87828 Personal history of other (healed) physical injury and trauma: Secondary | ICD-10-CM | POA: Insufficient documentation

## 2012-06-15 DIAGNOSIS — K219 Gastro-esophageal reflux disease without esophagitis: Secondary | ICD-10-CM | POA: Insufficient documentation

## 2012-06-15 DIAGNOSIS — J45909 Unspecified asthma, uncomplicated: Secondary | ICD-10-CM | POA: Insufficient documentation

## 2012-06-15 DIAGNOSIS — Z8709 Personal history of other diseases of the respiratory system: Secondary | ICD-10-CM | POA: Insufficient documentation

## 2012-06-15 DIAGNOSIS — Z872 Personal history of diseases of the skin and subcutaneous tissue: Secondary | ICD-10-CM | POA: Insufficient documentation

## 2012-06-15 DIAGNOSIS — M1990 Unspecified osteoarthritis, unspecified site: Secondary | ICD-10-CM | POA: Insufficient documentation

## 2012-06-15 DIAGNOSIS — T83091A Other mechanical complication of indwelling urethral catheter, initial encounter: Secondary | ICD-10-CM | POA: Insufficient documentation

## 2012-06-15 DIAGNOSIS — E119 Type 2 diabetes mellitus without complications: Secondary | ICD-10-CM | POA: Insufficient documentation

## 2012-06-15 DIAGNOSIS — R82998 Other abnormal findings in urine: Secondary | ICD-10-CM | POA: Insufficient documentation

## 2012-06-15 DIAGNOSIS — T839XXS Unspecified complication of genitourinary prosthetic device, implant and graft, sequela: Secondary | ICD-10-CM

## 2012-06-15 DIAGNOSIS — Z87898 Personal history of other specified conditions: Secondary | ICD-10-CM | POA: Insufficient documentation

## 2012-06-15 DIAGNOSIS — C61 Malignant neoplasm of prostate: Secondary | ICD-10-CM | POA: Insufficient documentation

## 2012-06-15 LAB — URINALYSIS, ROUTINE W REFLEX MICROSCOPIC
Bilirubin Urine: NEGATIVE
Glucose, UA: 100 mg/dL — AB
Ketones, ur: NEGATIVE mg/dL
Nitrite: NEGATIVE
Protein, ur: >300 mg/dL — AB
Specific Gravity, Urine: 1.026 (ref 1.005–1.030)
Urobilinogen, UA: 0.2 mg/dL (ref 0.0–1.0)
pH: 5.5 (ref 5.0–8.0)

## 2012-06-15 LAB — URINE MICROSCOPIC-ADD ON

## 2012-06-15 NOTE — ED Notes (Signed)
Pt demonstrated how to change foley to a leg bag.  Questions answered.  Verbalized understanding of procedure.

## 2012-06-15 NOTE — ED Notes (Signed)
Pt reports leg bag burst, pelvic pain and leakage around foley insertion site. Pt also wants to be checked if possible yeast infection remains.

## 2012-06-15 NOTE — ED Notes (Signed)
Foley catheter from home removed without any difficulty. Yellow/brown colored pus noted in tubing. Pt tolerated well. EDP notified. Vitals stable. Pt resting quietly in exam room.

## 2012-06-15 NOTE — ED Notes (Signed)
Pt presents to department for evaluation of urinary catheter problems. States he manages foley catheter with leg bag at home. States urine is leaking out of foley and he is having pain at tip of penis. No blood noted. No trauma to meatus. Also states leg bag "busted" earlier today. Concerned that he might need different catheter. Denies abdominal pain. Pt is alert and oriented x4.

## 2012-06-18 NOTE — ED Provider Notes (Signed)
History    62 year old male presenting with Foley catheter problem. Patient states that his leg bag broke and he has no new onces to change it out with. Also has been having leaking around the catheter at the meatus. No hematuria. No abdominal or back pain. Patient had a Foley catheter since July of last year. States he has a urologist that he follows up with. No acute complaints otherwise.  CSN: [id number]  Arrival date & time 06/15/12  1116   First MD Initiated Contact with Patient 06/15/12 1252      Chief Complaint  Patient presents with  . Urinary Retention    (Consider location/radiation/quality/duration/timing/severity/associated sxs/prior treatment) HPI  Past Medical History  Diagnosis Date  . ED (erectile dysfunction)   . Mild asthma SEASONAL -  PT HAS NO INHALER  . Diabetes mellitus ORAL MEDS    TYPE II  . Prostate cancer 05/01/2011 BX    ADENOCARCINOMA,gLEASON= 3+3=6,VOLUNE=68.8CC ,PSA=7.40  . Arthritis LUMBAR BACK, SHOULDER, LEFT KNEE, RIGHT WRIST  . DJD (degenerative joint disease)   . Chronic joint pain   . GERD (gastroesophageal reflux disease)   . Seasonal allergies   . 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  1997 (15 YRS AGO)    STATES NO DRUG USE SINCE  . History of head injury 2010-  CLOSED  W/ CONCUSSION--   NO RESIDUAL  . Eczema     Past Surgical History  Procedure Laterality Date  . Rotator cuff repair  01-10-2004    RIGHT  . Radioactive seed implant  09/07/2011    Procedure: RADIOACTIVE SEED IMPLANT;  Surgeon: Garnett Farm, MD;  Location: [facility];  Service: Urology;  Laterality: N/A;  84  seeds implanted  . Cystoscopy  09/07/2011    Procedure: CYSTOSCOPY;  Surgeon: Garnett Farm, MD;  Location[facility];  Service: Urology;  Laterality: N/A;  no seeds noted in bladder    Family History  Problem Relation Age of Onset  . Prostate cancer Maternal Uncle     History   Substance Use Topics  . Smoking status: Former Smoker    Types: Cigarettes    Quit date: 09/02/1973  . Smokeless tobacco: Never Used     Comment: QUIT SMOKING 1970'S  . Alcohol Use: No      Review of Systems  All systems reviewed and negative, other than as noted in HPI.   Allergies  Review of patient's allergies indicates no known allergies.  Home Medications   Current Outpatient Rx  Name  Route  Sig  Dispense  Refill  . aspirin 81 MG chewable tablet   Oral   Chew 81 mg by mouth daily.         Marland Kitchen glimepiride (AMARYL) 4 MG tablet   Oral   Take 4 mg by mouth daily before breakfast.         . glipiZIDE (GLUCOTROL XL) 10 MG 24 hr tablet   Oral   Take 10 mg by mouth daily.         . metFORMIN (GLUCOPHAGE) 500 MG tablet   Oral   Take 500 mg by mouth 2 (two) times daily with a meal.         . metoprolol (LOPRESSOR) 50 MG tablet   Oral   Take 50 mg by mouth 2 (two) times daily.         . Multiple Vitamin (MULTIVITAMIN WITH MINERALS) TABS  Oral   Take 1 tablet by mouth daily.         Marland Kitchen triamcinolone (KENALOG) 0.025 % ointment   Topical   Apply 1 application topically 2 (two) times daily.           BP 132/82  Pulse 89  Temp(Src) 98.1 F (36.7 C) (Oral)  Resp 18  SpO2 99%  Physical Exam  Nursing note and vitals reviewed. Constitutional: He appears well-developed and well-nourished. No distress.  HENT:  Head: Normocephalic and atraumatic.  Eyes: Conjunctivae are normal. Right eye exhibits no discharge. Left eye exhibits no discharge.  Neck: Neck supple.  Cardiovascular: Normal rate, regular rhythm and normal heart sounds.  Exam reveals no gallop and no friction rub.   No murmur heard. Pulmonary/Chest: Effort normal and breath sounds normal. No respiratory distress.  Abdominal: Soft. He exhibits no distension. There is no tenderness.  Genitourinary:  Normal external male genitalia. Foley catheter in place. No obvious drainage in the 80s. No  concerning lesions noted. Attachment of the Foley catheter to his leg bag reinforced with duct tape.  Musculoskeletal: He exhibits no edema and no tenderness.  Neurological: He is alert.  Skin: Skin is warm and dry.  Psychiatric: He has a normal mood and affect. His behavior is normal. Thought content normal.    ED Course  Procedures (including critical care time)  Labs Reviewed  URINALYSIS, ROUTINE W REFLEX MICROSCOPIC - Abnormal; Notable for the following:    APPearance CLOUDY (*)    Glucose, UA 100 (*)    Hgb urine dipstick LARGE (*)    Protein, ur >300 (*)    Leukocytes, UA MODERATE (*)    All other components within normal limits  URINE MICROSCOPIC-ADD ON - Abnormal; Notable for the following:    Crystals CA OXALATE CRYSTALS (*)    All other components within normal limits   No results found.   1. Foley catheter problem, sequela       MDM  62 year old male presenting with leakage around his Foley catheter. Patient is close to be due to have his catheter replaced anyways.Changed in the emergency room without incident and given new leg bag. UA suggestive of UTI but given chronic Foley and otherwise asymptomatic, antibiotics were not ordered at this time.        Raeford Razor, MD 06/18/12 [phone]

## 2012-06-25 ENCOUNTER — Emergency Department (HOSPITAL_COMMUNITY)
Admission: EM | Admit: 2012-06-25 | Discharge: 2012-06-25 | Disposition: A | Discharge status: Home / Self Care | Payer: Medicare Other | Attending: Emergency Medicine | Admitting: Emergency Medicine

## 2012-06-25 ENCOUNTER — Encounter (HOSPITAL_COMMUNITY): Payer: Self-pay | Admitting: *Deleted

## 2012-06-25 DIAGNOSIS — Z7982 Long term (current) use of aspirin: Secondary | ICD-10-CM | POA: Insufficient documentation

## 2012-06-25 DIAGNOSIS — Z8546 Personal history of malignant neoplasm of prostate: Secondary | ICD-10-CM | POA: Insufficient documentation

## 2012-06-25 DIAGNOSIS — Z872 Personal history of diseases of the skin and subcutaneous tissue: Secondary | ICD-10-CM | POA: Insufficient documentation

## 2012-06-25 DIAGNOSIS — Z8739 Personal history of other diseases of the musculoskeletal system and connective tissue: Secondary | ICD-10-CM | POA: Insufficient documentation

## 2012-06-25 DIAGNOSIS — Z87828 Personal history of other (healed) physical injury and trauma: Secondary | ICD-10-CM | POA: Insufficient documentation

## 2012-06-25 DIAGNOSIS — E119 Type 2 diabetes mellitus without complications: Secondary | ICD-10-CM | POA: Insufficient documentation

## 2012-06-25 DIAGNOSIS — Z79899 Other long term (current) drug therapy: Secondary | ICD-10-CM | POA: Insufficient documentation

## 2012-06-25 DIAGNOSIS — J45909 Unspecified asthma, uncomplicated: Secondary | ICD-10-CM | POA: Insufficient documentation

## 2012-06-25 DIAGNOSIS — R109 Unspecified abdominal pain: Secondary | ICD-10-CM | POA: Insufficient documentation

## 2012-06-25 DIAGNOSIS — F1211 Cannabis abuse, in remission: Secondary | ICD-10-CM | POA: Insufficient documentation

## 2012-06-25 DIAGNOSIS — Z87448 Personal history of other diseases of urinary system: Secondary | ICD-10-CM | POA: Insufficient documentation

## 2012-06-25 DIAGNOSIS — R110 Nausea: Secondary | ICD-10-CM | POA: Insufficient documentation

## 2012-06-25 DIAGNOSIS — N390 Urinary tract infection, site not specified: Secondary | ICD-10-CM | POA: Insufficient documentation

## 2012-06-25 DIAGNOSIS — K219 Gastro-esophageal reflux disease without esophagitis: Secondary | ICD-10-CM | POA: Insufficient documentation

## 2012-06-25 LAB — BASIC METABOLIC PANEL
BUN: 22 mg/dL (ref 6–23)
Chloride: 104 mEq/L (ref 96–112)
Creatinine, Ser: 1.21 mg/dL (ref 0.50–1.35)
GFR calc Af Amer: 74 mL/min — ABNORMAL LOW (ref >90)
Glucose, Bld: 127 mg/dL — ABNORMAL HIGH (ref 70–99)

## 2012-06-25 LAB — CBC WITH DIFFERENTIAL/PLATELET
Basophils Relative: 0 % (ref 0–1)
Eosinophils Absolute: 0 10*3/uL (ref 0.0–0.7)
HCT: 35.6 % — ABNORMAL LOW (ref 39.0–52.0)
Hemoglobin: 12.7 g/dL — ABNORMAL LOW (ref 13.0–17.0)
MCH: 30.4 pg (ref 26.0–34.0)
MCHC: 35.7 g/dL (ref 30.0–36.0)
Monocytes Absolute: 0.8 10*3/uL (ref 0.1–1.0)
Monocytes Relative: 7 % (ref 3–12)
Neutro Abs: 10.1 10*3/uL — ABNORMAL HIGH (ref 1.7–7.7)

## 2012-06-25 LAB — URINALYSIS, MICROSCOPIC ONLY
Glucose, UA: NEGATIVE mg/dL
Ketones, ur: 15 mg/dL — AB
pH: 7.5 (ref 5.0–8.0)

## 2012-06-25 MED ORDER — MORPHINE SULFATE 4 MG/ML IJ SOLN
4.0000 mg | Freq: Once | INTRAMUSCULAR | Status: AC
  Administered 2012-06-25: 4 mg via INTRAVENOUS
  Filled 2012-06-25: qty 1

## 2012-06-25 MED ORDER — CIPROFLOXACIN HCL 500 MG PO TABS: 500.0000 mg | ORAL_TABLET | Freq: Two times a day (BID) | ORAL | Status: DC

## 2012-06-25 MED ORDER — OXYCODONE-ACETAMINOPHEN 5-325 MG PO TABS
1.0000 | ORAL_TABLET | Freq: Once | ORAL | Status: AC
  Administered 2012-06-25: 1 via ORAL
  Filled 2012-06-25: qty 1

## 2012-06-25 MED ORDER — OXYCODONE-ACETAMINOPHEN 5-325 MG PO TABS: 1.0000 | ORAL_TABLET | Freq: Four times a day (QID) | ORAL | Status: DC | PRN

## 2012-06-25 MED ORDER — SULFAMETHOXAZOLE-TRIMETHOPRIM 800-160 MG PO TABS: 1.0000 | ORAL_TABLET | Freq: Two times a day (BID) | ORAL | Status: DC

## 2012-06-25 NOTE — ED Notes (Signed)
Pt has foley cath in place pta, reports bag has not had any urine drain since 0800 and pt is very uncomfortable.

## 2012-06-25 NOTE — ED Provider Notes (Signed)
History     CSN: [id number]  Arrival date & time 06/25/12  1533   First MD Initiated Contact with Patient 06/25/12 1553      Chief Complaint  Patient presents with  . Urinary Retention    (Consider location/radiation/quality/duration/timing/severity/associated sxs/prior treatment) The history is provided by the patient.   patient presents with decreased output from his Foley catheter. His lower abdominal pain also. He   states he had decreased urine output after around 8 AM this morning. No fevers. He is in mild nausea without vomiting. No constipation. He states it feels as if his catheter is blocked up. His history of same. Past Medical History  Diagnosis Date  . ED (erectile dysfunction)   . Mild asthma SEASONAL -  PT HAS NO INHALER  . Diabetes mellitus ORAL MEDS    TYPE II  . Prostate cancer 05/01/2011 BX    ADENOCARCINOMA,gLEASON= 3+3=6,VOLUNE=68.8CC ,PSA=7.40  . Arthritis LUMBAR BACK, SHOULDER, LEFT KNEE, RIGHT WRIST  . DJD (degenerative joint disease)   . Chronic joint pain   . GERD (gastroesophageal reflux disease)   . Seasonal allergies   . 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  1997 (15 YRS AGO)    STATES NO DRUG USE SINCE  . History of head injury 2010-  CLOSED  W/ CONCUSSION--   NO RESIDUAL  . Eczema     Past Surgical History  Procedure Laterality Date  . Rotator cuff repair  01-10-2004    RIGHT  . Radioactive seed implant  09/07/2011    Procedure: RADIOACTIVE SEED IMPLANT;  Surgeon: Garnett Farm, MD;  Location: [facility];  Service: Urology;  Laterality: N/A;  84  seeds implanted  . Cystoscopy  09/07/2011    Procedure: CYSTOSCOPY;  Surgeon: Garnett Farm, MD;  Location: [facility];  Service: Urology;  Laterality: N/A;  no seeds noted in bladder    Family History  Problem Relation Age of Onset  . Prostate cancer Maternal Uncle     History  Substance Use Topics  . Smoking  status: Former Smoker    Types: Cigarettes    Quit date: 09/02/1973  . Smokeless tobacco: Never Used     Comment: QUIT SMOKING 1970'S  . Alcohol Use: No      Review of Systems  Constitutional: Negative for activity change and appetite change.  HENT: Negative for neck stiffness.   Eyes: Negative for pain.  Respiratory: Negative for chest tightness and shortness of breath.   Cardiovascular: Negative for chest pain and leg swelling.  Gastrointestinal: Positive for nausea and abdominal pain. Negative for vomiting and diarrhea.  Genitourinary: Positive for decreased urine volume. Negative for flank pain and penile swelling.  Musculoskeletal: Negative for back pain.  Skin: Negative for rash.  Neurological: Negative for weakness, numbness and headaches.  Psychiatric/Behavioral: Negative for behavioral problems.    Allergies  Review of patient's allergies indicates no known allergies.  Home Medications   Current Outpatient Rx  Name  Route  Sig  Dispense  Refill  . aspirin 81 MG chewable tablet   Oral   Chew 81 mg by mouth daily.         Marland Kitchen glimepiride (AMARYL) 4 MG tablet   Oral   Take 4 mg by mouth daily before breakfast.         . glipiZIDE (GLUCOTROL XL) 10 MG 24 hr tablet   Oral   Take 10 mg by mouth  daily.         . metFORMIN (GLUCOPHAGE) 500 MG tablet   Oral   Take 500 mg by mouth 2 (two) times daily with a meal.         . metoprolol (LOPRESSOR) 50 MG tablet   Oral   Take 50 mg by mouth 2 (two) times daily.         . Multiple Vitamin (MULTIVITAMIN WITH MINERALS) TABS   Oral   Take 1 tablet by mouth daily.         Marland Kitchen triamcinolone (KENALOG) 0.025 % ointment   Topical   Apply 1 application topically 2 (two) times daily.         . ciprofloxacin (CIPRO) 500 MG tablet   Oral   Take 1 tablet (500 mg total) by mouth 2 (two) times daily.   14 tablet   0   . oxyCODONE-acetaminophen (PERCOCET/ROXICET) 5-325 MG per tablet   Oral   Take 1-2 tablets by  mouth every 6 (six) hours as needed for pain.   10 tablet   0     BP 144/82  Pulse 92  Temp(Src) 99.2 F (37.3 C) (Oral)  Resp 16  SpO2 99%  Physical Exam  Nursing note and vitals reviewed. Constitutional: He is oriented to person, place, and time. He appears well-developed and well-nourished.  HENT:  Head: Normocephalic and atraumatic.  Eyes: EOM are normal. Pupils are equal, round, and reactive to light.  Neck: Normal range of motion. Neck supple.  Cardiovascular: Normal rate, regular rhythm and normal heart sounds.   No murmur heard. Pulmonary/Chest: Effort normal and breath sounds normal.  Abdominal: Soft. Bowel sounds are normal. He exhibits mass. He exhibits no distension. There is tenderness. There is no rebound and no guarding.  Suprapubic tenderness and mass.  Genitourinary: Penis normal.  Foley catheter in place with leg bag  Musculoskeletal: Normal range of motion. He exhibits no edema.  Neurological: He is alert and oriented to person, place, and time. No cranial nerve deficit.  Skin: Skin is warm and dry.  Psychiatric: He has a normal mood and affect.    ED Course  Procedures (including critical care time)  Labs Reviewed  URINALYSIS, MICROSCOPIC ONLY - Abnormal; Notable for the following:    Color, Urine ORANGE (*)    APPearance TURBID (*)    Hgb urine dipstick LARGE (*)    Bilirubin Urine SMALL (*)    Ketones, ur 15 (*)    Protein, ur >300 (*)    Nitrite POSITIVE (*)    Leukocytes, UA LARGE (*)    Bacteria, UA MANY (*)    Crystals CA OXALATE CRYSTALS (*)    All other components within normal limits  CBC WITH DIFFERENTIAL - Abnormal; Notable for the following:    WBC 11.9 (*)    RBC 4.18 (*)    Hemoglobin 12.7 (*)    HCT 35.6 (*)    Neutrophils Relative 85 (*)    Neutro Abs 10.1 (*)    Lymphocytes Relative 9 (*)    All other components within normal limits  BASIC METABOLIC PANEL - Abnormal; Notable for the following:    CO2 18 (*)    Glucose,  Bld 127 (*)    GFR calc non Af Amer 64 (*)    GFR calc Af Amer 74 (*)    All other components within normal limits  URINE CULTURE   No results found.   1. UTI (lower urinary tract infection)  MDM  Patient with chronic Foley. Feeling of obstruction. Found a urinary tract infection but did not have much of retention. Pain is improved after treatment. White count is mildly elevated. Patient be treated with antibiotics for his urinary tract infection and cultures were sent away followed by urology. Other intra-abdominal pathology felt less likely.        Juliet Rude. Rubin Payor, MD 06/25/12 2221

## 2012-06-26 LAB — URINE CULTURE: Colony Count: >=100000

## 2012-06-27 ENCOUNTER — Observation Stay (HOSPITAL_COMMUNITY)
Admission: EM | Admit: 2012-06-27 | Discharge: 2012-06-28 | Disposition: A | Discharge status: Home / Self Care | Payer: Medicare Other | Attending: Internal Medicine | Admitting: Internal Medicine

## 2012-06-27 ENCOUNTER — Encounter (HOSPITAL_COMMUNITY): Payer: Self-pay | Admitting: Emergency Medicine

## 2012-06-27 DIAGNOSIS — N179 Acute kidney failure, unspecified: Principal | ICD-10-CM

## 2012-06-27 DIAGNOSIS — T83091D Other mechanical complication of indwelling urethral catheter, subsequent encounter: Secondary | ICD-10-CM

## 2012-06-27 DIAGNOSIS — D72829 Elevated white blood cell count, unspecified: Secondary | ICD-10-CM

## 2012-06-27 DIAGNOSIS — R338 Other retention of urine: Secondary | ICD-10-CM | POA: Insufficient documentation

## 2012-06-27 DIAGNOSIS — E119 Type 2 diabetes mellitus without complications: Secondary | ICD-10-CM | POA: Insufficient documentation

## 2012-06-27 DIAGNOSIS — T8389XA Other specified complication of genitourinary prosthetic devices, implants and grafts, initial encounter: Secondary | ICD-10-CM | POA: Insufficient documentation

## 2012-06-27 DIAGNOSIS — IMO0001 Reserved for inherently not codable concepts without codable children: Secondary | ICD-10-CM

## 2012-06-27 DIAGNOSIS — N400 Enlarged prostate without lower urinary tract symptoms: Secondary | ICD-10-CM | POA: Insufficient documentation

## 2012-06-27 DIAGNOSIS — Y846 Urinary catheterization as the cause of abnormal reaction of the patient, or of later complication, without mention of misadventure at the time of the procedure: Secondary | ICD-10-CM | POA: Insufficient documentation

## 2012-06-27 DIAGNOSIS — B3749 Other urogenital candidiasis: Secondary | ICD-10-CM | POA: Diagnosis present

## 2012-06-27 DIAGNOSIS — R339 Retention of urine, unspecified: Secondary | ICD-10-CM

## 2012-06-27 DIAGNOSIS — N289 Disorder of kidney and ureter, unspecified: Secondary | ICD-10-CM

## 2012-06-27 DIAGNOSIS — I10 Essential (primary) hypertension: Secondary | ICD-10-CM

## 2012-06-27 DIAGNOSIS — Z5189 Encounter for other specified aftercare: Secondary | ICD-10-CM

## 2012-06-27 DIAGNOSIS — C61 Malignant neoplasm of prostate: Secondary | ICD-10-CM | POA: Insufficient documentation

## 2012-06-27 DIAGNOSIS — R109 Unspecified abdominal pain: Secondary | ICD-10-CM | POA: Insufficient documentation

## 2012-06-27 LAB — URINE MICROSCOPIC-ADD ON

## 2012-06-27 LAB — POCT I-STAT, CHEM 8
Calcium, Ion: 1.16 mmol/L (ref 1.12–1.23)
Chloride: 107 mEq/L (ref 96–112)
HCT: 40 % (ref 39.0–52.0)
Potassium: 4.4 mEq/L (ref 3.5–5.1)
Sodium: 136 mEq/L (ref 135–145)

## 2012-06-27 LAB — URINALYSIS, ROUTINE W REFLEX MICROSCOPIC
Nitrite: NEGATIVE
Specific Gravity, Urine: 1.008 (ref 1.005–1.030)
Urobilinogen, UA: 0.2 mg/dL (ref 0.0–1.0)

## 2012-06-27 LAB — CBC WITH DIFFERENTIAL/PLATELET
Basophils Absolute: 0 10*3/uL (ref 0.0–0.1)
HCT: 35.2 % — ABNORMAL LOW (ref 39.0–52.0)
Lymphocytes Relative: 5 % — ABNORMAL LOW (ref 12–46)
Monocytes Absolute: 1.6 10*3/uL — ABNORMAL HIGH (ref 0.1–1.0)
Neutro Abs: 13.6 10*3/uL — ABNORMAL HIGH (ref 1.7–7.7)
RDW: 13.3 % (ref 11.5–15.5)
WBC: 16 10*3/uL — ABNORMAL HIGH (ref 4.0–10.5)

## 2012-06-27 LAB — GLUCOSE, CAPILLARY: Glucose-Capillary: 216 mg/dL — ABNORMAL HIGH (ref 70–99)

## 2012-06-27 MED ORDER — PANTOPRAZOLE SODIUM 40 MG PO TBEC
40.0000 mg | DELAYED_RELEASE_TABLET | Freq: Every day | ORAL | Status: DC
  Administered 2012-06-28: 40 mg via ORAL
  Filled 2012-06-27: qty 1

## 2012-06-27 MED ORDER — SENNOSIDES-DOCUSATE SODIUM 8.6-50 MG PO TABS
1.0000 | ORAL_TABLET | Freq: Every evening | ORAL | Status: DC | PRN
  Filled 2012-06-27: qty 1

## 2012-06-27 MED ORDER — ASPIRIN 81 MG PO CHEW
81.0000 mg | CHEWABLE_TABLET | Freq: Every day | ORAL | Status: DC
  Administered 2012-06-27 – 2012-06-28 (×2): 81 mg via ORAL
  Filled 2012-06-27 (×2): qty 1

## 2012-06-27 MED ORDER — BISACODYL 5 MG PO TBEC: 5.0000 mg | DELAYED_RELEASE_TABLET | Freq: Every day | ORAL | Status: DC | PRN

## 2012-06-27 MED ORDER — DOCUSATE SODIUM 100 MG PO CAPS
100.0000 mg | ORAL_CAPSULE | Freq: Two times a day (BID) | ORAL | Status: DC
  Administered 2012-06-27 – 2012-06-28 (×2): 100 mg via ORAL
  Filled 2012-06-27 (×3): qty 1

## 2012-06-27 MED ORDER — ONDANSETRON HCL 4 MG PO TABS: 4.0000 mg | ORAL_TABLET | Freq: Four times a day (QID) | ORAL | Status: DC | PRN

## 2012-06-27 MED ORDER — ENOXAPARIN SODIUM 40 MG/0.4ML ~~LOC~~ SOLN: 40.0000 mg | SUBCUTANEOUS | Status: DC

## 2012-06-27 MED ORDER — TRIAMCINOLONE ACETONIDE 0.025 % EX OINT
1.0000 "application " | TOPICAL_OINTMENT | Freq: Two times a day (BID) | CUTANEOUS | Status: DC
  Filled 2012-06-27 (×17): qty 1

## 2012-06-27 MED ORDER — TRIAMCINOLONE ACETONIDE 0.025 % EX OINT
1.0000 "application " | TOPICAL_OINTMENT | Freq: Two times a day (BID) | CUTANEOUS | Status: DC
  Administered 2012-06-27 – 2012-06-28 (×2): 1 via TOPICAL
  Filled 2012-06-27: qty 1

## 2012-06-27 MED ORDER — ACETAMINOPHEN 650 MG RE SUPP: 650.0000 mg | Freq: Four times a day (QID) | RECTAL | Status: DC | PRN

## 2012-06-27 MED ORDER — ALUM & MAG HYDROXIDE-SIMETH 200-200-20 MG/5ML PO SUSP: 30.0000 mL | Freq: Four times a day (QID) | ORAL | Status: DC | PRN

## 2012-06-27 MED ORDER — MORPHINE SULFATE 4 MG/ML IJ SOLN: 4.0000 mg | Freq: Once | INTRAMUSCULAR | Status: DC

## 2012-06-27 MED ORDER — ONDANSETRON 4 MG PO TBDP: 4.0000 mg | ORAL_TABLET | Freq: Once | ORAL | Status: DC

## 2012-06-27 MED ORDER — ONDANSETRON HCL 4 MG/2ML IJ SOLN: 4.0000 mg | Freq: Four times a day (QID) | INTRAMUSCULAR | Status: DC | PRN

## 2012-06-27 MED ORDER — OXYCODONE-ACETAMINOPHEN 5-325 MG PO TABS: 1.0000 | ORAL_TABLET | Freq: Four times a day (QID) | ORAL | Status: DC | PRN

## 2012-06-27 MED ORDER — METOPROLOL TARTRATE 50 MG PO TABS
50.0000 mg | ORAL_TABLET | Freq: Two times a day (BID) | ORAL | Status: DC
  Administered 2012-06-27 – 2012-06-28 (×2): 50 mg via ORAL
  Filled 2012-06-27 (×3): qty 1

## 2012-06-27 MED ORDER — ACETAMINOPHEN 325 MG PO TABS: 650.0000 mg | ORAL_TABLET | Freq: Four times a day (QID) | ORAL | Status: DC | PRN

## 2012-06-27 MED ORDER — LIDOCAINE HCL 2 % EX GEL
CUTANEOUS | Status: AC
  Administered 2012-06-27: 14:00:00
  Filled 2012-06-27: qty 10

## 2012-06-27 MED ORDER — ONDANSETRON HCL 4 MG/2ML IJ SOLN: 4.0000 mg | Freq: Once | INTRAMUSCULAR | Status: DC

## 2012-06-27 MED ORDER — SODIUM CHLORIDE 0.9 % IV SOLN
INTRAVENOUS | Status: DC
  Administered 2012-06-27 – 2012-06-28 (×2): via INTRAVENOUS

## 2012-06-27 MED ORDER — ENOXAPARIN SODIUM 30 MG/0.3ML ~~LOC~~ SOLN
30.0000 mg | SUBCUTANEOUS | Status: DC
  Administered 2012-06-27: 30 mg via SUBCUTANEOUS
  Filled 2012-06-27 (×2): qty 0.3

## 2012-06-27 MED ORDER — INSULIN ASPART 100 UNIT/ML ~~LOC~~ SOLN: 0.0000 [IU] | Freq: Three times a day (TID) | SUBCUTANEOUS | Status: DC

## 2012-06-27 NOTE — ED Notes (Signed)
Attempted to flush home foley, flushed 50ml but unable to return. Pt had gush of blood from penis. Removed foley per PA verbal order and attempted to replace with 16 [place]. Unable to pass foley.  Dr. Radford Pax placed 16 French coude catheter.  Urine return 850 ml initial with dark brown color.

## 2012-06-27 NOTE — ED Notes (Signed)
Pt states he has been having abd pain since Wednesday.  Was seen at [facility], foley placed.  States pain has been worse since then.  States he has had emesis 3x today, LBM 5 days ago.  States he has not been drinking much since abd started.  Foley drainage yellow/red.  Pt states abd is swollen from norm.

## 2012-06-27 NOTE — ED Notes (Signed)
Pt presenting to ed with c/o abdominal pain with positive nausea and vomiting pt denies diarrhea  Pt states he was seen at [place] x 2 days ago for the same but his pain is worse.

## 2012-06-27 NOTE — Consult Note (Signed)
  Mr. Huaracha is a 62 yo BM patient of Dr. Margrett Rud who we were asked to see in consultation by Dr. Eliseo Gum.   He has a history of Gleason 6 prostate cancer treated with Brachytherapy in 5/13.  He has chronic retention and has a foley.   He was seen in the ER on 3/12 with retention and the foley was changed.   He was to f/u in the office today, but there was an issue with his copay and he elected to go to the ER.  He was having pain with poor bladder drainage.   He was found to have an obstructed foley and it was replaced with return of turbid urine.    He had a normal Cr on 3/12 but it was up to 6 today.   It has been up to 10 in the past, but his baseline is about 1.   He also has a WBC count of 16K but no fever and his culture from 2 days ago grew only yeast.  His pain is reduced with the catheter replacement.  I have reviewed his office notes and recent labs.   He has been poorly compliant with f/u and was last seen in the office in 11/13 and generally comes to the ER for catheter changes.   Imp: Chronic retention with exacerbation with an obstructed foley and ARI.        Funguria with a leukocytosis that may just be related to the pain with his obstructed foley.  Rec: Overnight observation by hospital service for the ARI and leukocytosis.         He has an appt with Dr. Vernie Ammons on 4/7 that he needs to keep.  Time 30 min.   CC: Dr. Radford Pax.

## 2012-06-27 NOTE — H&P (Signed)
[facility] History and Physical  Cole Conrad ZOX:[id number] DOB: [birth year] DOA: 06/27/2012  Referring physician: Dr Radford Pax PCP: Georganna Skeans, MD  Specialists: Urology: Dr Vernie Ammons  Chief Complaint: Abdominal pain/urinary retention/Foley catheter not draining  HPI: Cole Conrad is a 62 y.o. male with past medical history of Gleason 6 prostate cancer treated with brachytherapy May of 2013, history of chronic urinary retention with chronic Foley catheter, history of diabetes, hypertension, gastroesophageal reflux disease who presented to the ED with a 2 day history of worsening abdominal pain, abdominal distention, subjective fevers, an episode of emesis and Foley catheter not draining. Patient does endorse some chills and some nausea. Patient denied any chest pain, no shortness of breath, no weakness, no cough, no diarrhea, no constipation, no hematemesis, no hematochezia, no melena. Patient stated he presented to [facility] 2 days prior to admission with his similar symptoms for the catheter was changed he said a urinalysis was obtained he was placed on some pain medication as well as oral antibiotics and discharged with followup with his urologist Dr. Vernie Ammons today. Patient had presented to the ED today stating that there was an issue with his co-pay and finances and was told he could not be seen at his urologist office if he didn't have any money and subsequently he elected to present to the ED. In the ED labs obtained showed a BUN of 46 and a creatinine of 6.20 with a normal potassium. CBC done had a white count of 16,000 and hemoglobin of 12.3. Foley catheter was noted to be obstructed and it was changed with return of turbid urine. 800cc-1 L of urine output was obtained with symptomatic improvement in his abdominal pain.  We were called to admit the patient.   Review of Systems: The patient denies anorexia, fever, weight loss,, vision loss, decreased hearing, hoarseness, chest pain,  syncope, dyspnea on exertion, peripheral edema, balance deficits, hemoptysis, abdominal pain, melena, hematochezia, severe indigestion/heartburn, hematuria, incontinence, genital sores, muscle weakness, suspicious skin lesions, transient blindness, difficulty walking, depression, unusual weight change, abnormal bleeding, enlarged lymph nodes, angioedema, and breast masses.   Past Medical History  Diagnosis Date  . ED (erectile dysfunction)   . Mild asthma SEASONAL -  PT HAS NO INHALER  . Diabetes mellitus ORAL MEDS    TYPE II  . Prostate cancer 05/01/2011 BX    ADENOCARCINOMA,gLEASON= 3+3=6,VOLUNE=68.8CC ,PSA=7.40  . Arthritis LUMBAR BACK, SHOULDER, LEFT KNEE, RIGHT WRIST  . DJD (degenerative joint disease)   . Chronic joint pain   . GERD (gastroesophageal reflux disease)   . Seasonal allergies   . 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  1997 (15 YRS AGO)    STATES NO DRUG USE SINCE  . History of head injury 2010-  CLOSED  W/ CONCUSSION--   NO RESIDUAL  . Eczema    Past Surgical History  Procedure Laterality Date  . Rotator cuff repair  01-10-2004    RIGHT  . Radioactive seed implant  09/07/2011    Procedure: RADIOACTIVE SEED IMPLANT;  Surgeon: Garnett Farm, MD;  Location: [facility];  Service: Urology;  Laterality: N/A;  86  seeds implanted  . Cystoscopy  09/07/2011    Procedure: CYSTOSCOPY;  Surgeon: Garnett Farm, MD;  Location: [facility];  Service: Urology;  Laterality: N/A;  no seeds noted in bladder   Social History:  reports that he quit smoking about 38 years ago. His smoking use included Cigarettes. He  smoked 0.00 packs per day. He has never used smokeless tobacco. He reports that he does not drink alcohol or use illicit drugs.  No Known Allergies  Family History  Problem Relation Age of Onset  . Prostate cancer Maternal Uncle    in an argument and will do we have to stop at some point await  and see her on Monday   Prior to Admission medications   Medication Sig Start Date End Date Taking? Authorizing Provider  aspirin 81 MG chewable tablet Chew 81 mg by mouth daily.   Yes Historical Provider, MD  ciprofloxacin (CIPRO) 500 MG tablet Take 1 tablet (500 mg total) by mouth 2 (two) times daily. 06/25/12  Yes Nathan R. Pickering, MD  glimepiride (AMARYL) 4 MG tablet Take 4 mg by mouth daily before breakfast.   Yes Historical Provider, MD  glipiZIDE (GLUCOTROL XL) 10 MG 24 hr tablet Take 10 mg by mouth daily.   Yes Historical Provider, MD  metFORMIN (GLUCOPHAGE) 500 MG tablet Take 500 mg by mouth 2 (two) times daily with a meal.   Yes Historical Provider, MD  metoprolol (LOPRESSOR) 50 MG tablet Take 50 mg by mouth 2 (two) times daily.   Yes Historical Provider, MD  Multiple Vitamin (MULTIVITAMIN WITH MINERALS) TABS Take 1 tablet by mouth daily.   Yes Historical Provider, MD  oxyCODONE-acetaminophen (PERCOCET/ROXICET) 5-325 MG per tablet Take 1-2 tablets by mouth every 6 (six) hours as needed for pain. 06/25/12  Yes Nathan R. Pickering, MD  triamcinolone (KENALOG) 0.025 % ointment Apply 1 application topically 2 (two) times daily.   Yes Historical Provider, MD   Physical Exam: Filed Vitals:   06/27/12 1324 06/27/12 1736  BP: 151/82 142/73  Pulse: 79 85  Temp: 99 F (37.2 C) 98.5 F (36.9 C)  TempSrc: Oral   Resp: 20 22  SpO2: 100% 99%     General:  WDWN in no acute cardiopulmonary disease.  Eyes: PERRLA. EOMI  ENT: Oropharynx clear with no lesions, no exudates.  Neck: Supple, no LAD  Cardiovascular: RRR, no JVD, no LE edema  Respiratory: CTAB, NO CRACKLES, NO WHEEZING, NO RHONCHI  Abdomen: Soft/NT/ND/+BS  Skin: No rashes no lesions.  Musculoskeletal: 5/5 BUE strength, 5/5 bilateral lower extremity strength  Psychiatric: Normal mood. Normal affect. Good insight. Her judgment.  Neurologic: alert and oriented x3. Cranial nerves II through XII are grossly  intact.  Labs on Admission:  Basic Metabolic Panel:  Recent Labs Lab 06/25/12 1837 06/27/12 1524  NA 137 136  K 4.4 4.4  CL 104 107  CO2 18*  --   GLUCOSE 127* 171*  BUN 22 46*  CREATININE 1.21 6.20*  CALCIUM 9.4  --    Liver Function Tests: No results found for this basename: AST, ALT, ALKPHOS, BILITOT, PROT, ALBUMIN,  in the last 168 hours No results found for this basename: LIPASE, AMYLASE,  in the last 168 hours No results found for this basename: AMMONIA,  in the last 168 hours CBC:  Recent Labs Lab 06/25/12 1837 06/27/12 1524 06/27/12 1531  WBC 11.9*  --  16.0*  NEUTROABS 10.1*  --  13.6*  HGB 12.7* 13.6 12.3*  HCT 35.6* 40.0 35.2*  MCV 85.2  --  86.7  PLT 294  --  243   Cardiac Enzymes: No results found for this basename: CKTOTAL, CKMB, CKMBINDEX, TROPONINI,  in the last 168 hours  BNP (last 3 results) No results found for this basename: PROBNP,  in the last 8760 hours CBG:  No results found for this basename: GLUCAP,  in the last 168 hours  Radiological Exams on Admission: No results found.  EKG: None  Assessment/Plan Principal Problem:   ARF (acute renal failure) Active Problems:   DM, UNCOMPLICATED, TYPE II, UNCONTROLLED   BENIGN PROSTATIC HYPERTROPHY   Prostate cancer   Arthritis   Hypertension   Urinary retention   Leukocytosis   Candiduria   Obstructed Foley catheter   #1 acute renal failure Secondary to a post renal azotemia secondary to obstructed Foley catheter. Patient's creatinine today is 6.20. Patient's creatinine was 1.212 days ago. Will check a urine sodium and urine creatinine. Will check a renal ultrasound. Place on IV fluids. Will follow. Urology has seen the patient.  #2 chronic retention with exacerbation with an obstructed Foley catheter Urinalysis done did show Candiduria from 2 days ago. Patient is currently afebrile. Patient does have a leukocytosis which is likely reactive. Foley catheter has been changed with good  urinary output of close to 1 L. Monitor. Patient has been seen by urology, Dr Annabell Howells. Patient is to followup on April 7 as scheduled.  #3 candiduria We'll monitor for now. Was not placed on any treatment at this point as only one also of Candida noted. Follow.  #4 type 2 diabetes Check a hemoglobin A1c. Hold oral hypoglycemic agents. Sliding scale insulin.  #5 hypertension Continue home regimen of Lopressor.  #6 Leukocytosis Likely secondary to reactive leukocytosis. Urine cultures from 2 days prior to admission showed only yeast. Patient currently afebrile. Will repeat UA with cultures and sensitivities. Will hold off on any antibiotics at this time. Follow.  #7 history of prostate cancer status post brachytherapy Per urology.  #8 prophylaxis PPI for GI prophylaxis. Lovenox for DVT prophylaxis.  Code Status: Full Family Communication: Updated patient and friend at bedside Disposition Plan: Admit to Med Surg  Time spent: 5 MINS  [facility] Pager [phone]  If 7PM-7AM, please contact night-coverage [email or web address] Password [facility] 06/27/2012, 5:41 PM

## 2012-06-27 NOTE — ED Provider Notes (Signed)
Medical screening examination/treatment/procedure(s) were conducted as a shared visit with non-physician practitioner(s) and myself.  I personally evaluated the patient during the encounter   Nelia Shi, MD 06/27/12 Rickey Primus

## 2012-06-27 NOTE — ED Provider Notes (Signed)
History     CSN: [id number]  Arrival date & time 06/27/12  1306   First MD Initiated Contact with Patient 06/27/12 1324      Chief Complaint  Patient presents with  . Abdominal Pain    (Consider location/radiation/quality/duration/timing/severity/associated sxs/prior treatment) HPI  Patient to the ED with complaints of urinary retention causing severe abdominal pains. He was seen in the ED 2 days ago and had his foley catheter, which is chronic, but says he did not get relief of his pain or urine passage. They felt that he did not have any urine to pass and they sent him home. Today he says his pain is excruciating. He is supposed to see his urologist Dr. Phoebe Perch today at 2pm but is unable to pay the copay. He came here for help. Denies any vomiting, fevers, chills, weakness, diaphoresis.   Past Medical History  Diagnosis Date  . ED (erectile dysfunction)   . Mild asthma SEASONAL -  PT HAS NO INHALER  . Diabetes mellitus ORAL MEDS    TYPE II  . Prostate cancer 05/01/2011 BX    ADENOCARCINOMA,gLEASON= 3+3=6,VOLUNE=68.8CC ,PSA=7.40  . Arthritis LUMBAR BACK, SHOULDER, LEFT KNEE, RIGHT WRIST  . DJD (degenerative joint disease)   . Chronic joint pain   . GERD (gastroesophageal reflux disease)   . Seasonal allergies   . 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  1997 (15 YRS AGO)    STATES NO DRUG USE SINCE  . History of head injury 2010-  CLOSED  W/ CONCUSSION--   NO RESIDUAL  . Eczema     Past Surgical History  Procedure Laterality Date  . Rotator cuff repair  01-10-2004    RIGHT  . Radioactive seed implant  09/07/2011    Procedure: RADIOACTIVE SEED IMPLANT;  Surgeon: Garnett Farm, MD;  Location: [facility];  Service: Urology;  Laterality: N/A;  84  seeds implanted  . Cystoscopy  09/07/2011    Procedure: CYSTOSCOPY;  Surgeon: Garnett Farm, MD;  Location: [facility];  Service: Urology;  Laterality:  N/A;  no seeds noted in bladder    Family History  Problem Relation Age of Onset  . Prostate cancer Maternal Uncle     History  Substance Use Topics  . Smoking status: Former Smoker    Types: Cigarettes    Quit date: 09/02/1973  . Smokeless tobacco: Never Used     Comment: QUIT SMOKING 1970'S  . Alcohol Use: No      Review of Systems  All other systems reviewed and are negative.    Allergies  Review of patient's allergies indicates no known allergies.  Home Medications   Current Outpatient Rx  Name  Route  Sig  Dispense  Refill  . aspirin 81 MG chewable tablet   Oral   Chew 81 mg by mouth daily.         . ciprofloxacin (CIPRO) 500 MG tablet   Oral   Take 1 tablet (500 mg total) by mouth 2 (two) times daily.   14 tablet   0   . glimepiride (AMARYL) 4 MG tablet   Oral   Take 4 mg by mouth daily before breakfast.         . glipiZIDE (GLUCOTROL XL) 10 MG 24 hr tablet   Oral   Take 10 mg by mouth daily.         . metFORMIN (GLUCOPHAGE) 500 MG tablet  Oral   Take 500 mg by mouth 2 (two) times daily with a meal.         . metoprolol (LOPRESSOR) 50 MG tablet   Oral   Take 50 mg by mouth 2 (two) times daily.         . Multiple Vitamin (MULTIVITAMIN WITH MINERALS) TABS   Oral   Take 1 tablet by mouth daily.         Marland Kitchen oxyCODONE-acetaminophen (PERCOCET/ROXICET) 5-325 MG per tablet   Oral   Take 1-2 tablets by mouth every 6 (six) hours as needed for pain.   10 tablet   0   . triamcinolone (KENALOG) 0.025 % ointment   Topical   Apply 1 application topically 2 (two) times daily.           BP 151/82  Pulse 79  Temp(Src) 99 F (37.2 C) (Oral)  Resp 20  SpO2 100%  Physical Exam  Nursing note and vitals reviewed. Constitutional: He appears well-developed and well-nourished. No distress.  HENT:  Head: Normocephalic and atraumatic.  Eyes: Pupils are equal, round, and reactive to light.  Neck: Normal range of motion. Neck supple.   Cardiovascular: Normal rate and regular rhythm.   Pulmonary/Chest: Effort normal.  Abdominal: Soft. He exhibits distension. There is tenderness. There is no rebound.  Neurological: He is alert.  Skin: Skin is warm and dry.    ED Course  Procedures (including critical care time)  Labs Reviewed  POCT I-STAT, CHEM 8 - Abnormal; Notable for the following:    BUN 46 (*)    Creatinine, Ser 6.20 (*)    Glucose, Bld 171 (*)    All other components within normal limits  CBC WITH DIFFERENTIAL   No results found.   1. Renal insufficiency   2. Urinary retention   3. Obstructed Foley catheter, subsequent encounter       MDM  Bladder scan shows greater than of urine in bladder.  Nurse attempted to replace foley catheter and got a large amount of blood from penis. Dr. Radford Pax replaced foley catheter at bedside and > 1L or urine returned into foley bag and patient is having complete resolution of pain symptoms. He says that he will be able to see Dr. Phoebe Perch in a few days and will find the copay by then.  I-stat checked in ED. It shows Creatinine has bumped from baseline of 1.00. I discussed with Dr. Radford Pax and he recommends admission so that creatinine can be monitored.  I spoke with [facility], they have agreed to follow-up patient. Will admit to medicine.  [facility] team 3, med-surg, Dr. Janee Morn.     Dorthula Matas, PA-C 06/27/12 1650

## 2012-06-28 ENCOUNTER — Observation Stay (HOSPITAL_COMMUNITY): Payer: Medicare Other

## 2012-06-28 LAB — CBC
MCH: 29.7 pg (ref 26.0–34.0)
MCHC: 33.8 g/dL (ref 30.0–36.0)
RDW: 13.6 % (ref 11.5–15.5)

## 2012-06-28 LAB — HEMOGLOBIN A1C: Mean Plasma Glucose: 137 mg/dL — ABNORMAL HIGH (ref <117)

## 2012-06-28 LAB — BASIC METABOLIC PANEL
BUN: 34 mg/dL — ABNORMAL HIGH (ref 6–23)
Creatinine, Ser: 2.91 mg/dL — ABNORMAL HIGH (ref 0.50–1.35)
GFR calc Af Amer: 26 mL/min — ABNORMAL LOW (ref >90)
GFR calc non Af Amer: 22 mL/min — ABNORMAL LOW (ref >90)
Glucose, Bld: 137 mg/dL — ABNORMAL HIGH (ref 70–99)

## 2012-06-28 LAB — CREATININE, URINE, RANDOM: Creatinine, Urine: 37.6 mg/dL

## 2012-06-28 NOTE — Progress Notes (Signed)
Patient ID: Cole Conrad, male   DOB: [birth year], 62 y.o.   MRN: [id number]    Subjective: Cole Conrad is doing well today.  He has had a brisk diuresis and his creatinine has declined to 2.91 from 6.2.  His WBC count has declined to 9.2 and he remains afebrile.   ROS: Negative except as above  Objective: Vital signs in last 24 hours: Temp:  [98 F (36.7 C)-99 F (37.2 C)] 98 F (36.7 C) (03/15 0530) Pulse Rate:  [61-85] 61 (03/15 0530) Resp:  [20-22] 20 (03/15 0530) BP: (102-151)/(65-82) 102/65 mmHg (03/15 0530) SpO2:  [96 %-100 %] 96 % (03/15 0530) Weight:  [90.311 kg (199 lb 1.6 oz)] 90.311 kg (199 lb 1.6 oz) (03/14 1923)  Intake/Output from previous day: 03/14 0701 - 03/15 0700 In: 883.3 [I.V.:883.3] Out: 7900 [Urine:7900] Intake/Output this shift: Total I/O In: 240 [P.O.:240] Out: -   General appearance: alert and no distress Male genitalia: normal, urine is clear in catheter tube.   Lab Results:   Recent Labs  06/27/12 1531 06/28/12 0404  WBC 16.0* 9.2  HGB 12.3* 10.4*  HCT 35.2* 30.8*  PLT 243 223   BMET  Recent Labs  06/25/12 1837 06/27/12 1524 06/28/12 0404  NA 137 136 136  K 4.4 4.4 4.0  CL 104 107 106  CO2 18*  --  21  GLUCOSE 127* 171* 137*  BUN 22 46* 34*  CREATININE 1.21 6.20* 2.91*  CALCIUM 9.4  --  8.5   PT/INR No results found for this basename: LABPROT, INR,  in the last 72 hours ABG No results found for this basename: PHART, PCO2, PO2, HCO3,  in the last 72 hours  Studies/Results: US Renal  06/28/2012  *RADIOLOGY REPORT*  Clinical Data: Acute renal failure  RENAL/URINARY TRACT ULTRASOUND COMPLETE  Comparison:  None.  Findings:  Right Kidney:  11.3 cm in length.  Normal echogenicity.  No hydronephrosis or mass.  Left Kidney:  12.5 cm in length.  Normal cortical echogenicity.  No hydronephrosis or mass. A very small amount of perinephric fluid is present about the lower pole.  Bladder:  Foley catheter decompresses the bladder.   IMPRESSION: No evidence of hydronephrosis.  Tiny amount of left perinephric fluid is nonspecific.  Foley catheter decompresses the bladder.   Original Report Authenticated By: Jolaine Click, M.D.     Anti-infectives: Anti-infectives   None      Current Facility-Administered Medications  Medication Dose Route Frequency Provider Last Rate Last Dose  . 0.9 %  sodium chloride infusion   Intravenous Continuous Rodolph Bong, MD 125 mL/hr at 06/28/12 [phone]    . acetaminophen (TYLENOL) tablet 650 mg  650 mg Oral Q6H PRN Rodolph Bong, MD       Or  . acetaminophen (TYLENOL) suppository 650 mg  650 mg Rectal Q6H PRN Rodolph Bong, MD      . alum & mag hydroxide-simeth (MAALOX/MYLANTA) 200-200-20 MG/5ML suspension 30 mL  30 mL Oral Q6H PRN Rodolph Bong, MD      . aspirin chewable tablet 81 mg  81 mg Oral Daily Rodolph Bong, MD   81 mg at 06/27/12 2122  . bisacodyl (DULCOLAX) EC tablet 5 mg  5 mg Oral Daily PRN Rodolph Bong, MD      . docusate sodium (COLACE) capsule 100 mg  100 mg Oral BID Rodolph Bong, MD   100 mg at 06/27/12 2122  . enoxaparin (LOVENOX) injection 30 mg  30 mg  Subcutaneous Q24H Jinger Neighbors, NP   30 mg at 06/27/12 2122  . insulin aspart (novoLOG) injection 0-9 Units  0-9 Units Subcutaneous TID WC Rodolph Bong, MD      . metoprolol (LOPRESSOR) tablet 50 mg  50 mg Oral BID Rodolph Bong, MD   50 mg at 06/27/12 2122  . morphine 4 MG/ML injection 4 mg  4 mg Intravenous Once Tiffany G Greene, PA-C      . ondansetron [facility]) injection 4 mg  4 mg Intravenous Once Hurman Horn, MD      . ondansetron [facility]) tablet 4 mg  4 mg Oral Q6H PRN Rodolph Bong, MD       Or  . ondansetron [facility]) injection 4 mg  4 mg Intravenous Q6H PRN Rodolph Bong, MD      . oxyCODONE-acetaminophen (PERCOCET/ROXICET) 5-325 MG per tablet 1-2 tablet  1-2 tablet Oral Q6H PRN Rodolph Bong, MD      . pantoprazole (PROTONIX) EC tablet 40 mg  40 mg Oral Daily  Rodolph Bong, MD      . senna-docusate (Senokot-S) tablet 1 tablet  1 tablet Oral QHS PRN Rodolph Bong, MD      . triamcinolone (KENALOG) 0.025 % ointment 1 application  1 application Topical BID Rodolph Bong, MD   1 application at 06/27/12 2123    Assessment: His ARI is resolving with foley drainage.  Plan: Discharge per Dr. Janee Morn. He has f/u with Dr. Vernie Ammons on 07/21/12. He may benefit from a Case management consult to see what strategies are available to help him avoid so many ER visits for foley complications.     LOS: 1 day    Gabriela Irigoyen J 06/28/2012

## 2012-06-28 NOTE — Care Management (Addendum)
Cm spoke with patient concerning discharge planning. Per pt current admission related to inability to pay current outstanding bill at urology office. Per pt specialty MD visits require a 10.00 copay. Per pt he currently has a 200.00 outstanding bill at Dr.Ottelin's office for a diagnostic test. Pt advised by CM to contact Dr.Ottelin's office to set up a payment plan prior to scheduled visit on 07/21/12. Cm offered choice of HH services to assist with home care of foley catheter. Per pt choice [facility] to provide [facility] services upon discharge. Pt states previously receiving HH services from [facility] but services did not include changing of foley catheter. CM will advise MD to order [facility] with specific instructions to change foley catheter per MD order. CM to fax MD orders to [facility] at [phone]. No other needs stated.   Roxy Manns Jaeden [place],RN,BSN [phone]

## 2012-06-28 NOTE — Discharge Summary (Signed)
Physician Discharge Summary  Cole Conrad QIO:[id number] DOB: Sep 05, 1950 DOA: 06/27/2012  PCP: Quitman Livings, MD  Admit date: 06/27/2012 Discharge date: 06/28/2012  Time spent: 65 minutes  Recommendations for Outpatient Follow-up:  1. Followup with PCP one week post discharge. On followup basic metabolic profile will need to be obtained to followup on patient's electrolytes and renal function. 2. Patient is to followup with Dr. Vernie Ammons on 07/21/2012 as previously scheduled.  Discharge Diagnoses:  Principal Problem:   ARF (acute renal failure) Active Problems:   DM, UNCOMPLICATED, TYPE II, UNCONTROLLED   BENIGN PROSTATIC HYPERTROPHY   Prostate cancer   Arthritis   Hypertension   Urinary retention   Leukocytosis   Candiduria   Obstructed Foley catheter   Discharge Condition: Stable and improved  Diet recommendation: Carb modified  Filed Weights   06/27/12 1923  Weight: 90.311 kg (199 lb 1.6 oz)    History of present illness:  Cole Conrad is a 62 y.o. male with past medical history of Gleason 6 prostate cancer treated with brachytherapy May of 2013, history of chronic urinary retention with chronic Foley catheter, history of diabetes, hypertension, gastroesophageal reflux disease who presented to the ED with a 2 day history of worsening abdominal pain, abdominal distention, subjective fevers, an episode of emesis and Foley catheter not draining. Patient does endorse some chills and some nausea. Patient denied any chest pain, no shortness of breath, no weakness, no cough, no diarrhea, no constipation, no hematemesis, no hematochezia, no melena. Patient stated he presented to [facility] 2 days prior to admission with his similar symptoms for the catheter was changed he said a urinalysis was obtained he was placed on some pain medication as well as oral antibiotics and discharged with followup with his urologist Dr. Vernie Ammons today.  Patient had presented to the ED today stating  that there was an issue with his co-pay and finances and was told he could not be seen at his urologist office if he didn't have any money and subsequently he elected to present to the ED. In the ED labs obtained showed a BUN of 46 and a creatinine of 6.20 with a normal potassium. CBC done had a white count of 16,000 and hemoglobin of 12.3. Foley catheter was noted to be obstructed and it was changed with return of turbid urine. 800cc-1 L of urine output was obtained with symptomatic improvement in his abdominal pain.  We were called to admit the patient.    Hospital Course:  #1 acute renal failure  Patient was admitted in acute renal failure with a creatinine of 6.20. Patient's creatinine was 1.22 days prior to admission. It was felt patient's acute renal failure was a post renal azotemia secondary to obstructed Foley catheter. Patient's Foley catheter was changed in the emergency room with approximately 1 L output. Patient was placed on IV fluids and admitted under observation. Renal ultrasound which was done was negative for hydronephrosis. Patient had a brisk diuresis with approximately 7 L out. Patient's renal function improved significantly such that by day of discharge his creatinine was down to 2.91. Patient improved clinically with resolution of his abdominal pain. A urology consultation was obtained patient was seen in consultation by Dr. Annabell Howells was in agreement with patient's management. Patient will be discharged home with a home health RN for monthly Foley catheter changes. Patient is also to followup with PCP one week post discharge. Patient is to followup with Dr. Vernie Ammons of urology as scheduled on 07/21/2012. Patient will be discharged in stable  and improved condition.  #2 chronic retention with exacerbation with an obstructed Foley catheter  Patient noted to have a history of chronic urinary retention. Patient had presented to the ED with abdominal distention abdominal pain no urine output  from his Foley catheter that his Foley catheter was obstructed. Urinalysis which was done 2 days prior to admission did grow candiduria. Patient's Foley catheter was changed in the emergency room with good urine output with approximately close to a liter output. Patient was admitted for observation he was monitored he had good urine output with approximately 7 L out. Patient was also seen in consultation by Dr. Annabell Howells of urology who was in agreement with patient's management. Patient has been set up with a home health RN for monthly Foley catheter changes. Patient is to followup with urology, Dr. Vernie Ammons as scheduled on 07/21/2012.  #3 candiduria  Urinalysis done 2 days prior to admission grew out candida which was likely colonization. Patient was afebrile and leukocytosis had resolved by day of discharge.  #4 well-controlled type 2 diabetes  Hemoglobin A1c was 6.4. Patient was maintained on sliding scale throughout the hospitalization. Patient's oral hypoglycemic agents were held.  #5 hypertension  Remained stable throughout the hospitalization. Patient was maintained on his home regimen of Lopressor.  #6 Leukocytosis  Patient on admission was noted to have a leukocytosis which was felt to be reactive. Urine cultures which were done 2 days prior to admission was positive for Candida. Patient was afebrile. Repeat urinalysis which was done was negative. Patient was monitored and not placed on any antibiotics. Patient's leukocytosis resolved by day of discharge.    The rest of patient's chronic medical issues were stable throughout the hospitalization the patient will be discharged in stable and improved condition.      Procedures:  Renal US 06/28/12  Consultations:  Urology: Dr. Annabell Howells 06/27/2012  Discharge Exam: Filed Vitals:   06/27/12 1923 06/27/12 2156 06/28/12 0530 06/28/12 1401  BP: 134/74 127/65 102/65 119/81  Pulse: 72 73 61 117  Temp: 98.2 F (36.8 C)  98 F (36.7 C) 99.2 F (37.3  C)  TempSrc: Oral  Oral Oral  Resp: 20 20 20 20   Height: 5\' 8"  (1.727 m)     Weight: 90.311 kg (199 lb 1.6 oz)     SpO2: 100% 98% 96% 100%    General: NAD Cardiovascular: RRR Respiratory: CTAB  Discharge Instructions      Discharge Orders   Future Orders Complete By Expires     Diet Carb Modified  As directed     Discharge instructions  As directed     Comments:      Follow up with Dr Vernie Ammons as scheduled. Follow up with HASSAN,SAMI, MD in 1 week    Increase activity slowly  As directed         Medication List    STOP taking these medications       ciprofloxacin 500 MG tablet  Commonly known as:  CIPRO      TAKE these medications       aspirin 81 MG chewable tablet  Chew 81 mg by mouth daily.     glimepiride 4 MG tablet  Commonly known as:  AMARYL  Take 4 mg by mouth daily before breakfast.     glipiZIDE 10 MG 24 hr tablet  Commonly known as:  GLUCOTROL XL  Take 10 mg by mouth daily.     metFORMIN 500 MG tablet  Commonly known as:  GLUCOPHAGE  Take 500 mg by mouth 2 (two) times daily with a meal.     metoprolol 50 MG tablet  Commonly known as:  LOPRESSOR  Take 50 mg by mouth 2 (two) times daily.     multivitamin with minerals Tabs  Take 1 tablet by mouth daily.     oxyCODONE-acetaminophen 5-325 MG per tablet  Commonly known as:  PERCOCET/ROXICET  Take 1-2 tablets by mouth every 6 (six) hours as needed for pain.     triamcinolone 0.025 % ointment  Commonly known as:  KENALOG  Apply 1 application topically 2 (two) times daily.       Follow-up Information   Follow up with [facility], MD. Schedule an appointment as soon as possible for a visit in 1 week.   Contact information:   [address]  [place] [address] [phone]       Follow up with Garnett Farm, MD On 07/21/2012. (f/u as scheduled)    Contact information:   [address] [place] [address] [phone]        The results of significant  diagnostics from this hospitalization (including imaging, microbiology, ancillary and laboratory) are listed below for reference.    Significant Diagnostic Studies: US Renal  06/28/2012  *RADIOLOGY REPORT*  Clinical Data: Acute renal failure  RENAL/URINARY TRACT ULTRASOUND COMPLETE  Comparison:  None.  Findings:  Right Kidney:  11.3 cm in length.  Normal echogenicity.  No hydronephrosis or mass.  Left Kidney:  12.5 cm in length.  Normal cortical echogenicity.  No hydronephrosis or mass. A very small amount of perinephric fluid is present about the lower pole.  Bladder:  Foley catheter decompresses the bladder.  IMPRESSION: No evidence of hydronephrosis.  Tiny amount of left perinephric fluid is nonspecific.  Foley catheter decompresses the bladder.   Original Report Authenticated By: Jolaine Click, M.D.     Microbiology: Recent Results (from the past 240 hour(s))  URINE CULTURE     Status: None   Collection Time    06/25/12  5:37 PM      Result Value Range Status   Specimen Description URINE, CLEAN CATCH   Final   Special Requests NONE   Final   Culture  Setup Time 06/25/2012 18:30   Final   Colony Count >=100,000 COLONIES/ML   Final   Culture YEAST   Final   Report Status 06/26/2012 FINAL   Final  MRSA PCR SCREENING     Status: None   Collection Time    06/27/12  8:27 PM      Result Value Range Status   MRSA by PCR NEGATIVE  NEGATIVE Final   Comment:            The GeneXpert MRSA Assay (FDA     approved for NASAL specimens     only), is one component of a     comprehensive MRSA colonization     surveillance program. It is not     intended to diagnose MRSA     infection nor to guide or     monitor treatment for     MRSA infections.     Labs: Basic Metabolic Panel:  Recent Labs Lab 06/25/12 1837 06/27/12 1445 06/27/12 1524 06/28/12 0404  NA 137  --  136 136  K 4.4  --  4.4 4.0  CL 104  --  107 106  CO2 18*  --   --  21  GLUCOSE 127*  --  171* 137*  BUN 22  --  46* 34*   CREATININE 1.21  --  6.20* 2.91*  CALCIUM 9.4  --   --  8.5  MG  --  1.9  --   --    Liver Function Tests: No results found for this basename: AST, ALT, ALKPHOS, BILITOT, PROT, ALBUMIN,  in the last 168 hours No results found for this basename: LIPASE, AMYLASE,  in the last 168 hours No results found for this basename: AMMONIA,  in the last 168 hours CBC:  Recent Labs Lab 06/25/12 1837 06/27/12 1524 06/27/12 1531 06/28/12 0404  WBC 11.9*  --  16.0* 9.2  NEUTROABS 10.1*  --  13.6*  --   HGB 12.7* 13.6 12.3* 10.4*  HCT 35.6* 40.0 35.2* 30.8*  MCV 85.2  --  86.7 88.0  PLT 294  --  243 223   Cardiac Enzymes: No results found for this basename: CKTOTAL, CKMB, CKMBINDEX, TROPONINI,  in the last 168 hours BNP: BNP (last 3 results) No results found for this basename: PROBNP,  in the last 8760 hours CBG:  Recent Labs Lab 06/27/12 1852 06/27/12 2154 06/28/12 0731 06/28/12 1134  GLUCAP 216* 192* 115* 192*       Signed:  THOMPSON,DANIEL  [facility] 06/28/2012, 2:40 PM

## 2012-06-28 NOTE — Progress Notes (Signed)
Patient discharged per MD order. Discharge instructions reviewed with patient at bedside. Patient wheeled to the car via nurse tech for transport home. Angelena Form, RN

## 2012-06-29 LAB — URINE CULTURE

## 2012-09-20 ENCOUNTER — Emergency Department (HOSPITAL_COMMUNITY)
Admission: EM | Admit: 2012-09-20 | Discharge: 2012-09-20 | Disposition: A | Discharge status: Home / Self Care | Payer: Medicare Other | Attending: Emergency Medicine | Admitting: Emergency Medicine

## 2012-09-20 DIAGNOSIS — G8929 Other chronic pain: Secondary | ICD-10-CM | POA: Insufficient documentation

## 2012-09-20 DIAGNOSIS — Z7982 Long term (current) use of aspirin: Secondary | ICD-10-CM | POA: Insufficient documentation

## 2012-09-20 DIAGNOSIS — N390 Urinary tract infection, site not specified: Secondary | ICD-10-CM | POA: Insufficient documentation

## 2012-09-20 DIAGNOSIS — M2550 Pain in unspecified joint: Secondary | ICD-10-CM | POA: Insufficient documentation

## 2012-09-20 DIAGNOSIS — J45909 Unspecified asthma, uncomplicated: Secondary | ICD-10-CM | POA: Insufficient documentation

## 2012-09-20 DIAGNOSIS — Z8719 Personal history of other diseases of the digestive system: Secondary | ICD-10-CM | POA: Insufficient documentation

## 2012-09-20 DIAGNOSIS — E119 Type 2 diabetes mellitus without complications: Secondary | ICD-10-CM | POA: Insufficient documentation

## 2012-09-20 DIAGNOSIS — M1990 Unspecified osteoarthritis, unspecified site: Secondary | ICD-10-CM | POA: Insufficient documentation

## 2012-09-20 DIAGNOSIS — Y846 Urinary catheterization as the cause of abnormal reaction of the patient, or of later complication, without mention of misadventure at the time of the procedure: Secondary | ICD-10-CM | POA: Insufficient documentation

## 2012-09-20 DIAGNOSIS — Z87828 Personal history of other (healed) physical injury and trauma: Secondary | ICD-10-CM | POA: Insufficient documentation

## 2012-09-20 DIAGNOSIS — Z8546 Personal history of malignant neoplasm of prostate: Secondary | ICD-10-CM | POA: Insufficient documentation

## 2012-09-20 DIAGNOSIS — Z8709 Personal history of other diseases of the respiratory system: Secondary | ICD-10-CM | POA: Insufficient documentation

## 2012-09-20 DIAGNOSIS — Z79899 Other long term (current) drug therapy: Secondary | ICD-10-CM | POA: Insufficient documentation

## 2012-09-20 DIAGNOSIS — Z87448 Personal history of other diseases of urinary system: Secondary | ICD-10-CM | POA: Insufficient documentation

## 2012-09-20 DIAGNOSIS — Z87891 Personal history of nicotine dependence: Secondary | ICD-10-CM | POA: Insufficient documentation

## 2012-09-20 DIAGNOSIS — T83091A Other mechanical complication of indwelling urethral catheter, initial encounter: Secondary | ICD-10-CM | POA: Insufficient documentation

## 2012-09-20 DIAGNOSIS — Z872 Personal history of diseases of the skin and subcutaneous tissue: Secondary | ICD-10-CM | POA: Insufficient documentation

## 2012-09-20 DIAGNOSIS — R339 Retention of urine, unspecified: Secondary | ICD-10-CM | POA: Insufficient documentation

## 2012-09-20 LAB — URINALYSIS, ROUTINE W REFLEX MICROSCOPIC
Glucose, UA: 500 mg/dL — AB
Specific Gravity, Urine: 1.021 (ref 1.005–1.030)
pH: 7.5 (ref 5.0–8.0)

## 2012-09-20 LAB — URINE MICROSCOPIC-ADD ON

## 2012-09-20 MED ORDER — SULFAMETHOXAZOLE-TRIMETHOPRIM 800-160 MG PO TABS: 1.0000 | ORAL_TABLET | Freq: Two times a day (BID) | ORAL | Status: DC

## 2012-09-20 NOTE — ED Notes (Signed)
Pt c/o pain with urination and states it has been in for 2 months.

## 2012-09-20 NOTE — ED Notes (Signed)
Patient is resting comfortably. 

## 2012-09-20 NOTE — ED Notes (Signed)
Pt provided additional cup of water to procure a urine sample

## 2012-09-20 NOTE — ED Provider Notes (Signed)
History     CSN: [id number]  Arrival date & time 09/20/12  1142   First MD Initiated Contact with Patient 09/20/12 1216      Chief Complaint  Patient presents with  . Catheter problem     (Consider location/radiation/quality/duration/timing/severity/associated sxs/prior treatment) HPI Comments: Patient presents with a Foley catheter problem. He states that he's had a history of urinary retention in the past and is followed by a [facility]. He had a catheter placed about 2 months ago. He states he was supposed to have it out a few weeks ago but he cannot be seen over at [facility] because he is unable to pay for the visit. He states he's had some increased drainage around the catheter insertion and some foul-smelling odor to his urine. He came in to have the catheter removed. He denies any abdominal pain nausea vomiting or fevers.   Past Medical History  Diagnosis Date  . ED (erectile dysfunction)   . Mild asthma SEASONAL -  PT HAS NO INHALER  . Diabetes mellitus ORAL MEDS    TYPE II  . Prostate cancer 05/01/2011 BX    ADENOCARCINOMA,gLEASON= 3+3=6,VOLUNE=68.8CC ,PSA=7.40  . Arthritis LUMBAR BACK, SHOULDER, LEFT KNEE, RIGHT WRIST  . DJD (degenerative joint disease)   . Chronic joint pain   . GERD (gastroesophageal reflux disease)   . Seasonal allergies   . 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  1997 (15 YRS AGO)    STATES NO DRUG USE SINCE  . History of head injury 2010-  CLOSED  W/ CONCUSSION--   NO RESIDUAL  . Eczema     Past Surgical History  Procedure Laterality Date  . Rotator cuff repair  01-10-2004    RIGHT  . Radioactive seed implant  09/07/2011    Procedure: RADIOACTIVE SEED IMPLANT;  Surgeon: Garnett Farm, MD;  Location: [facility];  Service: Urology;  Laterality: N/A;  86  seeds implanted  . Cystoscopy  09/07/2011    Procedure: CYSTOSCOPY;  Surgeon: Garnett Farm, MD;  Location: [facility];  Service: Urology;  Laterality: N/A;  no seeds noted in bladder    Family History  Problem Relation Age of Onset  . Prostate cancer Maternal Uncle     History  Substance Use Topics  . Smoking status: Former Smoker    Types: Cigarettes    Quit date: 09/02/1973  . Smokeless tobacco: Never Used     Comment: QUIT SMOKING 1970'S  . Alcohol Use: No      Review of Systems  Constitutional: Negative for fever, chills, diaphoresis and fatigue.  HENT: Negative for congestion, rhinorrhea and sneezing.   Eyes: Negative.   Respiratory: Negative for cough, chest tightness and shortness of breath.   Cardiovascular: Negative for chest pain and leg swelling.  Gastrointestinal: Negative for nausea, vomiting, abdominal pain, diarrhea and blood in stool.  Genitourinary: Negative for frequency, hematuria, flank pain, penile swelling, difficulty urinating and penile pain.  Musculoskeletal: Negative for back pain and arthralgias.  Skin: Negative for rash.  Neurological: Negative for dizziness, speech difficulty, weakness, numbness and headaches.    Allergies  Review of patient's allergies indicates no known allergies.  Home Medications   Current Outpatient Rx  Name  Route  Sig  Dispense  Refill  . aspirin 81 MG chewable tablet   Oral   Chew 81 mg by mouth daily.         Marland Kitchen glimepiride (  AMARYL) 4 MG tablet   Oral   Take 4 mg by mouth daily before breakfast.         . glipiZIDE (GLUCOTROL XL) 10 MG 24 hr tablet   Oral   Take 10 mg by mouth daily.         . metFORMIN (GLUCOPHAGE) 500 MG tablet   Oral   Take 500 mg by mouth 2 (two) times daily with a meal.         . Multiple Vitamin (MULTIVITAMIN WITH MINERALS) TABS   Oral   Take 1 tablet by mouth daily.         Marland Kitchen sulfamethoxazole-trimethoprim (SEPTRA DS) 800-160 MG per tablet   Oral   Take 1 tablet by mouth every 12 (twelve) hours.   14 tablet   0     BP 131/83  Pulse 61  Temp(Src) 97.8 F (36.6 C)  (Oral)  Resp 18  Ht 5\' 8"  (1.727 m)  Wt 195 lb (88.451 kg)  BMI 29.66 kg/m2  SpO2 100%  Physical Exam  Constitutional: He is oriented to person, place, and time. He appears well-developed and well-nourished.  HENT:  Head: Normocephalic and atraumatic.  Eyes: Pupils are equal, round, and reactive to light.  Neck: Normal range of motion. Neck supple.  Cardiovascular: Normal rate, regular rhythm and normal heart sounds.   Pulmonary/Chest: Effort normal and breath sounds normal. No respiratory distress. He has no wheezes. He has no rales. He exhibits no tenderness.  Abdominal: Soft. Bowel sounds are normal. There is no tenderness. There is no rebound and no guarding.  Genitourinary:  Patient has a Foley catheter in place. There's no discharge or erythema to the tip of the penis. There is some yellow cloudy urine in the Foley bag.  Musculoskeletal: Normal range of motion. He exhibits no edema.  Lymphadenopathy:    He has no cervical adenopathy.  Neurological: He is alert and oriented to person, place, and time.  Skin: Skin is warm and dry. No rash noted.  Psychiatric: He has a normal mood and affect.    ED Course  Procedures (including critical care time)  Results for orders placed during the hospital encounter of 09/20/12  URINALYSIS, ROUTINE W REFLEX MICROSCOPIC      Result Value Range   Color, Urine YELLOW  YELLOW   APPearance CLOUDY (*) CLEAR   Specific Gravity, Urine 1.021  1.005 - 1.030   pH 7.5  5.0 - 8.0   Glucose, UA 500 (*) NEGATIVE mg/dL   Hgb urine dipstick MODERATE (*) NEGATIVE   Bilirubin Urine NEGATIVE  NEGATIVE   Ketones, ur NEGATIVE  NEGATIVE mg/dL   Protein, ur 161 (*) NEGATIVE mg/dL   Urobilinogen, UA 1.0  0.0 - 1.0 mg/dL   Nitrite POSITIVE (*) NEGATIVE   Leukocytes, UA LARGE (*) NEGATIVE  URINE MICROSCOPIC-ADD ON      Result Value Range   WBC, UA TOO NUMEROUS TO COUNT  <3 WBC/hpf   RBC / HPF 11-20  <3 RBC/hpf   Bacteria, UA MANY (*) RARE   No results  found.    1. Urinary retention   2. UTI (lower urinary tract infection)       MDM  Patient's Foley catheter was removed. Following this over the next couple hours he had increased pressure feeling in his bladder and pelvic any definite catheter put in. He was unable to urinate and said he was very uncomfortable. Therefore the catheter was placed back in. We will check a  urinalysis and a stress the importance of followup with patient either to his urologist or if he can get in to a urologist his primary care physician. I stressed importance of not leaving the catheter in that long again.        Rolan Bucco, MD 09/20/12 [phone]

## 2012-09-20 NOTE — ED Notes (Signed)
Leg bag applied by Gerilyn Pilgrim, NT.

## 2012-09-22 LAB — URINE CULTURE: Colony Count: >=100000

## 2012-09-23 ENCOUNTER — Telehealth (HOSPITAL_COMMUNITY): Payer: Self-pay | Admitting: Emergency Medicine

## 2012-09-23 NOTE — ED Notes (Signed)
Post ED Visit - Positive Culture Follow-up: Successful Patient Follow-Up  Culture assessed and recommendations reviewed by: []  Wes Dulaney, Pharm.D., BCPS [x]  Celedonio Miyamoto, Pharm.D., BCPS []  Georgina Pillion, Pharm.D., BCPS []  [place], [place].D., BCPS, AAHIVP []  Estella Husk, Pharm.D., BCPS, AAHIVP  Positive urine culture  []  Patient discharged without antimicrobial prescription and treatment is now indicated [x]  Organism is resistant to prescribed ED discharge antimicrobial []  Patient with positive blood cultures  Changes discussed with ED provider: Antony Madura PA-C New antibiotic prescription: Cipro 500 mg PO BID x 7 days    Kylie A Holland 09/23/2012, 10:05 AM

## 2012-09-23 NOTE — ED Notes (Signed)
Rx called to [facility] [phone] by Steward Ros PFM

## 2012-09-23 NOTE — Progress Notes (Signed)
ED Antimicrobial Stewardship Positive Culture Follow Up   Cole Conrad is an 62 y.o. male who presented to [facility] on 09/20/2012 with a chief complaint of  Chief Complaint  Patient presents with  . Catheter problem     Recent Results (from the past 720 hour(s))  URINE CULTURE     Status: None   Collection Time    09/20/12  2:44 PM      Result Value Range Status   Specimen Description URINE, CATHETERIZED   Final   Special Requests NONE   Final   Culture  Setup Time 09/20/2012 18:37   Final   Colony Count >=100,000 COLONIES/ML   Final   Culture ESCHERICHIA COLI   Final   Report Status 09/22/2012 FINAL   Final   Organism ID, Bacteria ESCHERICHIA COLI   Final    [x]  Treated with Bactrim, organism resistant to prescribed antimicrobial []  Patient discharged originally without antimicrobial agent and treatment is now indicated  New antibiotic prescription: Ciprofloxacin 500mg  po BID x 7 days  ED Provider: Antony Madura, PA-C   [name] 09/23/2012, 9:16 AM Infectious Diseases Pharmacist Phone# [phone]

## 2012-10-02 ENCOUNTER — Emergency Department (HOSPITAL_COMMUNITY)
Admission: EM | Admit: 2012-10-02 | Discharge: 2012-10-02 | Disposition: A | Discharge status: Home / Self Care | Payer: Medicare Other | Attending: Emergency Medicine | Admitting: Emergency Medicine

## 2012-10-02 ENCOUNTER — Encounter (HOSPITAL_COMMUNITY): Payer: Self-pay | Admitting: *Deleted

## 2012-10-02 DIAGNOSIS — Z87820 Personal history of traumatic brain injury: Secondary | ICD-10-CM | POA: Insufficient documentation

## 2012-10-02 DIAGNOSIS — Z87891 Personal history of nicotine dependence: Secondary | ICD-10-CM | POA: Insufficient documentation

## 2012-10-02 DIAGNOSIS — Z87448 Personal history of other diseases of urinary system: Secondary | ICD-10-CM | POA: Insufficient documentation

## 2012-10-02 DIAGNOSIS — Z872 Personal history of diseases of the skin and subcutaneous tissue: Secondary | ICD-10-CM | POA: Insufficient documentation

## 2012-10-02 DIAGNOSIS — F1911 Other psychoactive substance abuse, in remission: Secondary | ICD-10-CM | POA: Insufficient documentation

## 2012-10-02 DIAGNOSIS — Z79899 Other long term (current) drug therapy: Secondary | ICD-10-CM | POA: Insufficient documentation

## 2012-10-02 DIAGNOSIS — G8929 Other chronic pain: Secondary | ICD-10-CM | POA: Insufficient documentation

## 2012-10-02 DIAGNOSIS — Z8719 Personal history of other diseases of the digestive system: Secondary | ICD-10-CM | POA: Insufficient documentation

## 2012-10-02 DIAGNOSIS — M159 Polyosteoarthritis, unspecified: Secondary | ICD-10-CM | POA: Insufficient documentation

## 2012-10-02 DIAGNOSIS — R338 Other retention of urine: Secondary | ICD-10-CM | POA: Insufficient documentation

## 2012-10-02 DIAGNOSIS — E119 Type 2 diabetes mellitus without complications: Secondary | ICD-10-CM | POA: Insufficient documentation

## 2012-10-02 DIAGNOSIS — Z8546 Personal history of malignant neoplasm of prostate: Secondary | ICD-10-CM | POA: Insufficient documentation

## 2012-10-02 DIAGNOSIS — J45909 Unspecified asthma, uncomplicated: Secondary | ICD-10-CM | POA: Insufficient documentation

## 2012-10-02 NOTE — ED Notes (Signed)
Patient is alert and oriented x3.  He is complaining of lower abdominal pain that is a 10 of 10.  He states that he stopped draining urine early this morning and the pain has progressively gotten worse. He adds that he does have some nausea.

## 2012-10-02 NOTE — ED Provider Notes (Signed)
History     CSN: [id number]  Arrival date & time 10/02/12  2037   First MD Initiated Contact with Patient 10/02/12 2054      Chief Complaint  Patient presents with  . Urinary Retention    (Consider location/radiation/quality/duration/timing/severity/associated sxs/prior treatment) HPI  Patient presents to the emergency department with chief complaint of acute urinary retention.  He has a past medical history significant for prostate cancer with radioactive seeding, multiple episodes of previous acute urinary retention and chronic indwelling Foley catheter.  Patient was seen here actually 2 weeks ago for the same episode.  Urine showed infection at that time.  The patient was discharged with Bactrim.  Culture showed Bactrim resistance and antibiotic treatment was changed to Cipro.  Patient finished his course of Cipro yesterday.  This morning he woke with decreased urine output which eventually fully stopped.  He began having swelling in his lower abdomen and acute urinary retention.  The patient had Foley catheter removed and replaced before my initial evaluation and produced approximately a liter  of urine without turbidity, gross hematuria.  he denies flank pain, fever, chills, nausea, vomiting, abdominal pain no myalgias or arthralgias.  Past Medical History  Diagnosis Date  . ED (erectile dysfunction)   . Mild asthma SEASONAL -  PT HAS NO INHALER  . Diabetes mellitus ORAL MEDS    TYPE II  . Prostate cancer 05/01/2011 BX    ADENOCARCINOMA,gLEASON= 3+3=6,VOLUNE=68.8CC ,PSA=7.40  . Arthritis LUMBAR BACK, SHOULDER, LEFT KNEE, RIGHT WRIST  . DJD (degenerative joint disease)   . Chronic joint pain   . GERD (gastroesophageal reflux disease)   . Seasonal allergies   . 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  1997 (15 YRS AGO)    STATES NO DRUG USE SINCE  . History of head injury 2010-  CLOSED  W/ CONCUSSION--   NO RESIDUAL  . Eczema      Past Surgical History  Procedure Laterality Date  . Rotator cuff repair  01-10-2004    RIGHT  . Radioactive seed implant  09/07/2011    Procedure: RADIOACTIVE SEED IMPLANT;  Surgeon: Garnett Farm, MD;  Location: [facility];  Service: Urology;  Laterality: N/A;  86  seeds implanted  . Cystoscopy  09/07/2011    Procedure: CYSTOSCOPY;  Surgeon: Garnett Farm, MD;  Location: [facility];  Service: Urology;  Laterality: N/A;  no seeds noted in bladder    Family History  Problem Relation Age of Onset  . Prostate cancer Maternal Uncle     History  Substance Use Topics  . Smoking status: Former Smoker    Types: Cigarettes    Quit date: 09/02/1973  . Smokeless tobacco: Never Used     Comment: QUIT SMOKING 1970'S  . Alcohol Use: No      Review of Systems  Constitutional: Negative for fever and chills.  Respiratory: Negative for cough and shortness of breath.   Cardiovascular: Negative for chest pain and palpitations.  Gastrointestinal: Negative for vomiting, abdominal pain, diarrhea and constipation.  Genitourinary: Positive for difficulty urinating. Negative for dysuria, frequency and flank pain.  Musculoskeletal: Negative for myalgias and arthralgias.  Skin: Negative for rash.  Neurological: Negative for headaches.    Allergies  Review of patient's allergies indicates no known allergies.  Home Medications   Current Outpatient Rx  Name  Route  Sig  Dispense  Refill  . aspirin 81 MG chewable tablet  Oral   Chew 81 mg by mouth daily.         Marland Kitchen glimepiride (AMARYL) 4 MG tablet   Oral   Take 4 mg by mouth daily before breakfast.         . glipiZIDE (GLUCOTROL XL) 10 MG 24 hr tablet   Oral   Take 10 mg by mouth daily.         . metFORMIN (GLUCOPHAGE) 500 MG tablet   Oral   Take 500 mg by mouth 2 (two) times daily with a meal.         . Multiple Vitamin (MULTIVITAMIN WITH MINERALS) TABS   Oral   Take 1 tablet by mouth  daily.           BP 147/91  Pulse 93  Resp 20  SpO2 100%  Physical Exam  Nursing note and vitals reviewed. Constitutional: He appears well-developed and well-nourished. No distress.  HENT:  Head: Normocephalic and atraumatic.  Eyes: Conjunctivae are normal. No scleral icterus.  Neck: Normal range of motion. Neck supple.  Cardiovascular: Normal rate, regular rhythm and normal heart sounds.   Pulmonary/Chest: Effort normal and breath sounds normal. No respiratory distress.  Abdominal: Soft. There is no tenderness.  Genitourinary:  Normal circumcised penis.  Foley catheter is in place.  No blood around the meatus.  Clear urine is flowing through tubing.  No testicular masses, tenderness.  No hernias.  Musculoskeletal: He exhibits no edema.  Neurological: He is alert.  Skin: Skin is warm and dry. He is not diaphoretic.  Psychiatric: His behavior is normal.    ED Course  Procedures (including critical care time)  Labs Reviewed - No data to display No results found.   1. Acute urinary retention       MDM  9:51 PM BP 147/91  Pulse 93  Resp 20  SpO2 100% Patient with acute urinary retention. Chronic indwelling cath.  Had about one liter output and new cath inplace. Patient seen for sam on 6/7 d/c with bactrim . Switched to cipro and finished 7 day course due to bactrim resistance.  Patient is without flank  Pain , fever, nausea, vomiting. No sig. Discolored urine, turbidity or pyuria.  I will send his urine for culture and d/c patient to follow up with his urologist or PCP.        Arthor Captain, PA-C 10/04/12 1701

## 2012-10-02 NOTE — ED Notes (Signed)
Patient is alert and oriented x3.  He was given DC instructions and follow up visit instructions.  Patient gave verbal understanding.  He was DC ambulatory under his own power to home.  V/S stable.  He was not showing any signs of distress on DC 

## 2012-10-04 LAB — URINE CULTURE
Colony Count: NO GROWTH
Culture: NO GROWTH

## 2012-10-05 NOTE — ED Provider Notes (Signed)
Medical screening examination/treatment/procedure(s) were performed by non-physician practitioner and as supervising physician I was immediately available for consultation/collaboration.  Ettel Albergo M Jendaya Gossett, MD 10/05/12 0942 

## 2012-12-08 ENCOUNTER — Emergency Department (HOSPITAL_COMMUNITY)
Admission: EM | Admit: 2012-12-08 | Discharge: 2012-12-08 | Disposition: A | Discharge status: Home / Self Care | Payer: Medicare Other | Attending: Emergency Medicine | Admitting: Emergency Medicine

## 2012-12-08 ENCOUNTER — Encounter (HOSPITAL_COMMUNITY): Payer: Self-pay | Admitting: Emergency Medicine

## 2012-12-08 DIAGNOSIS — M19019 Primary osteoarthritis, unspecified shoulder: Secondary | ICD-10-CM | POA: Insufficient documentation

## 2012-12-08 DIAGNOSIS — Z79899 Other long term (current) drug therapy: Secondary | ICD-10-CM | POA: Insufficient documentation

## 2012-12-08 DIAGNOSIS — Z7982 Long term (current) use of aspirin: Secondary | ICD-10-CM | POA: Insufficient documentation

## 2012-12-08 DIAGNOSIS — Z8739 Personal history of other diseases of the musculoskeletal system and connective tissue: Secondary | ICD-10-CM | POA: Insufficient documentation

## 2012-12-08 DIAGNOSIS — Z87828 Personal history of other (healed) physical injury and trauma: Secondary | ICD-10-CM | POA: Insufficient documentation

## 2012-12-08 DIAGNOSIS — J45909 Unspecified asthma, uncomplicated: Secondary | ICD-10-CM | POA: Insufficient documentation

## 2012-12-08 DIAGNOSIS — G8929 Other chronic pain: Secondary | ICD-10-CM | POA: Insufficient documentation

## 2012-12-08 DIAGNOSIS — E119 Type 2 diabetes mellitus without complications: Secondary | ICD-10-CM | POA: Insufficient documentation

## 2012-12-08 DIAGNOSIS — Z8719 Personal history of other diseases of the digestive system: Secondary | ICD-10-CM | POA: Insufficient documentation

## 2012-12-08 DIAGNOSIS — Z87891 Personal history of nicotine dependence: Secondary | ICD-10-CM | POA: Insufficient documentation

## 2012-12-08 DIAGNOSIS — Z87448 Personal history of other diseases of urinary system: Secondary | ICD-10-CM | POA: Insufficient documentation

## 2012-12-08 DIAGNOSIS — N390 Urinary tract infection, site not specified: Secondary | ICD-10-CM

## 2012-12-08 DIAGNOSIS — R109 Unspecified abdominal pain: Secondary | ICD-10-CM | POA: Insufficient documentation

## 2012-12-08 DIAGNOSIS — Z872 Personal history of diseases of the skin and subcutaneous tissue: Secondary | ICD-10-CM | POA: Insufficient documentation

## 2012-12-08 DIAGNOSIS — R339 Retention of urine, unspecified: Secondary | ICD-10-CM | POA: Insufficient documentation

## 2012-12-08 DIAGNOSIS — Z8546 Personal history of malignant neoplasm of prostate: Secondary | ICD-10-CM | POA: Insufficient documentation

## 2012-12-08 DIAGNOSIS — Z8709 Personal history of other diseases of the respiratory system: Secondary | ICD-10-CM | POA: Insufficient documentation

## 2012-12-08 LAB — URINALYSIS, ROUTINE W REFLEX MICROSCOPIC
Bilirubin Urine: NEGATIVE
Glucose, UA: NEGATIVE mg/dL
Protein, ur: 100 mg/dL — AB
Urobilinogen, UA: 0.2 mg/dL (ref 0.0–1.0)

## 2012-12-08 LAB — URINE MICROSCOPIC-ADD ON

## 2012-12-08 MED ORDER — CIPROFLOXACIN HCL 500 MG PO TABS
500.0000 mg | ORAL_TABLET | Freq: Once | ORAL | Status: AC
  Administered 2012-12-08: 500 mg via ORAL
  Filled 2012-12-08: qty 1

## 2012-12-08 MED ORDER — CIPROFLOXACIN HCL 500 MG PO TABS: 500.0000 mg | ORAL_TABLET | Freq: Two times a day (BID) | ORAL | Status: AC

## 2012-12-08 NOTE — ED Provider Notes (Signed)
CSN: [id number]     Arrival date & time 12/08/12  0535 History     First MD Initiated Contact with Patient 12/08/12 0602     Chief Complaint  Patient presents with  . Urinary Retention   (Consider location/radiation/quality/duration/timing/severity/associated sxs/prior Treatment) HPI Patient presents with abdominal pain, decreased urine output.  By mouth notable history of chronic indwelling Foley catheter.  He typically has the catheter changed monthly.  His last change was approximately 5 weeks ago.  Over the past couple days he has had a decreased urine output.  Over the past day he has had increased lower abdominal pain, malodorous urine. No new fevers, chills, nausea, vomiting, other complaints.  Past Medical History  Diagnosis Date  . ED (erectile dysfunction)   . Mild asthma SEASONAL -  PT HAS NO INHALER  . Diabetes mellitus ORAL MEDS    TYPE II  . Prostate cancer 05/01/2011 BX    ADENOCARCINOMA,gLEASON= 3+3=6,VOLUNE=68.8CC ,PSA=7.40  . Arthritis LUMBAR BACK, SHOULDER, LEFT KNEE, RIGHT WRIST  . DJD (degenerative joint disease)   . Chronic joint pain   . GERD (gastroesophageal reflux disease)   . Seasonal allergies   . 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  1997 (15 YRS AGO)    STATES NO DRUG USE SINCE  . History of head injury 2010-  CLOSED  W/ CONCUSSION--   NO RESIDUAL  . Eczema    Past Surgical History  Procedure Laterality Date  . Rotator cuff repair  01-10-2004    RIGHT  . Radioactive seed implant  09/07/2011    Procedure: RADIOACTIVE SEED IMPLANT;  Surgeon: Garnett Farm, MD;  Location: [facility];  Service: Urology;  Laterality: N/A;  86  seeds implanted  . Cystoscopy  09/07/2011    Procedure: CYSTOSCOPY;  Surgeon: Garnett Farm, MD;  Location: [facility];  Service: Urology;  Laterality: N/A;  no seeds noted in bladder   Family History  Problem Relation Age of Onset  . Prostate  cancer Maternal Uncle    History  Substance Use Topics  . Smoking status: Former Smoker    Types: Cigarettes    Quit date: 09/02/1973  . Smokeless tobacco: Never Used     Comment: QUIT SMOKING 1970'S  . Alcohol Use: No    Review of Systems  All other systems reviewed and are negative.    Allergies  Review of patient's allergies indicates no known allergies.  Home Medications   Current Outpatient Rx  Name  Route  Sig  Dispense  Refill  . aspirin 81 MG chewable tablet   Oral   Chew 81 mg by mouth daily.         Marland Kitchen glimepiride (AMARYL) 4 MG tablet   Oral   Take 4 mg by mouth daily before breakfast.         . glipiZIDE (GLUCOTROL XL) 10 MG 24 hr tablet   Oral   Take 10 mg by mouth daily.         . metFORMIN (GLUCOPHAGE) 500 MG tablet   Oral   Take 500 mg by mouth 2 (two) times daily with a meal.         . Multiple Vitamin (MULTIVITAMIN WITH MINERALS) TABS   Oral   Take 1 tablet by mouth daily.          BP 164/97  Pulse 78  Temp(Src) 98.5 F (36.9 C) (Oral)  Resp 20  SpO2 97% Physical Exam  Nursing note and vitals reviewed. Constitutional: He is oriented to person, place, and time. He appears well-developed and well-nourished. No distress.  HENT:  Head: Normocephalic and atraumatic.  Eyes: Conjunctivae are normal. Right eye exhibits no discharge. Left eye exhibits no discharge.  Neck: No tracheal deviation present.  Pulmonary/Chest: No stridor. No respiratory distress.  Abdominal: He exhibits no distension.  Genitourinary:  Foley catheter bag draining clear urine.    Neurological: He is alert and oriented to person, place, and time. No cranial nerve deficit. He exhibits normal muscle tone. Coordination normal.  Skin: Skin is warm and dry. He is not diaphoretic.  Psychiatric: He has a normal mood and affect.    ED Course   Procedures (including critical care time)  Labs Reviewed  URINALYSIS, ROUTINE W REFLEX MICROSCOPIC   No results  found. No diagnosis found. Prior to my evaluation the patient's catheter was changed. Per report the prior catheter had copious amounts of material on the distal end   Review of the patient's recent urine culture demonstrates susceptibility to ciprofloxacin MDM  Patient presents with urinary retention, abdominal pain.  Notably, the patient is afebrile, generally well-appearing, denies other notable complaints.  With his inability to urinate, the catheter was changed empirically.  With concern for infection and started on antibiotics, discharged to follow up with his urologist.  Gerhard Munch, MD 12/08/12 [phone]

## 2012-12-08 NOTE — ED Notes (Signed)
Bed: WA16 Expected date:  Expected time:  Means of arrival:  Comments: EMS 

## 2012-12-08 NOTE — ED Notes (Signed)
Pt has had a urinary catheter placed for a year, stated he is having lower abdominal pain with no urinary output for the past several hours.  Pt has had problems with the catheter in the past.  Pt ambulatory to ED at this time

## 2012-12-08 NOTE — ED Notes (Signed)
Pt comfortable, resting waiting at bedside for mother.

## 2012-12-10 LAB — URINE CULTURE: Colony Count: >=100000

## 2012-12-11 NOTE — ED Notes (Signed)
Patient treated with  Cipro-sensitive to same-chart appended per protocol MD.

## 2013-01-14 ENCOUNTER — Encounter (HOSPITAL_COMMUNITY): Payer: Self-pay | Admitting: Emergency Medicine

## 2013-01-14 ENCOUNTER — Emergency Department (HOSPITAL_COMMUNITY)
Admission: EM | Admit: 2013-01-14 | Discharge: 2013-01-14 | Disposition: A | Discharge status: Home / Self Care | Payer: Medicare Other | Attending: Emergency Medicine | Admitting: Emergency Medicine

## 2013-01-14 DIAGNOSIS — G8929 Other chronic pain: Secondary | ICD-10-CM | POA: Insufficient documentation

## 2013-01-14 DIAGNOSIS — Y846 Urinary catheterization as the cause of abnormal reaction of the patient, or of later complication, without mention of misadventure at the time of the procedure: Secondary | ICD-10-CM | POA: Insufficient documentation

## 2013-01-14 DIAGNOSIS — M129 Arthropathy, unspecified: Secondary | ICD-10-CM | POA: Insufficient documentation

## 2013-01-14 DIAGNOSIS — Z79899 Other long term (current) drug therapy: Secondary | ICD-10-CM | POA: Insufficient documentation

## 2013-01-14 DIAGNOSIS — C61 Malignant neoplasm of prostate: Secondary | ICD-10-CM | POA: Insufficient documentation

## 2013-01-14 DIAGNOSIS — Z8719 Personal history of other diseases of the digestive system: Secondary | ICD-10-CM | POA: Insufficient documentation

## 2013-01-14 DIAGNOSIS — Z872 Personal history of diseases of the skin and subcutaneous tissue: Secondary | ICD-10-CM | POA: Insufficient documentation

## 2013-01-14 DIAGNOSIS — J45909 Unspecified asthma, uncomplicated: Secondary | ICD-10-CM | POA: Insufficient documentation

## 2013-01-14 DIAGNOSIS — Z7982 Long term (current) use of aspirin: Secondary | ICD-10-CM | POA: Insufficient documentation

## 2013-01-14 DIAGNOSIS — Z87891 Personal history of nicotine dependence: Secondary | ICD-10-CM | POA: Insufficient documentation

## 2013-01-14 DIAGNOSIS — T83091A Other mechanical complication of indwelling urethral catheter, initial encounter: Secondary | ICD-10-CM | POA: Insufficient documentation

## 2013-01-14 DIAGNOSIS — Z87828 Personal history of other (healed) physical injury and trauma: Secondary | ICD-10-CM | POA: Insufficient documentation

## 2013-01-14 DIAGNOSIS — E119 Type 2 diabetes mellitus without complications: Secondary | ICD-10-CM | POA: Insufficient documentation

## 2013-01-14 DIAGNOSIS — Z9889 Other specified postprocedural states: Secondary | ICD-10-CM | POA: Insufficient documentation

## 2013-01-14 NOTE — ED Notes (Signed)
Pt hx of prostate cancer.  Has had foley for 1 year.  Has had this particular foley x 30 days.  States that he is having urinary retention.  Urine present in pt's bag and states this urine has flowed within the last 2 hrs.  States urine is coming out around his foley.

## 2013-01-14 NOTE — ED Provider Notes (Signed)
Medical screening examination/treatment/procedure(s) were performed by non-physician practitioner and as supervising physician I was immediately available for consultation/collaboration.     Carlisa Eble R Ivah Girardot, MD 01/14/13 2357 

## 2013-01-14 NOTE — ED Notes (Signed)
I use a 100 sterile nss flush to flush to foley aperture with instant success with freely flowing cloudy light amber urine.  He thanks me and tell me he feels "much better".

## 2013-01-14 NOTE — ED Notes (Signed)
Pt ambulatory to exam room with steady gait.  

## 2013-01-14 NOTE — ED Provider Notes (Signed)
CSN: [id number]     Arrival date & time 01/14/13  1420 History   First MD Initiated Contact with Patient 01/14/13 1527     Chief Complaint  Patient presents with  . Urinary Retention   (Consider location/radiation/quality/duration/timing/severity/associated sxs/prior Treatment) HPI  Cole Conrad is a 62 y.o. male with PMH significant for prostate cancer s/p brachytherapy and chronic indwelling foley c/o increasing suprapubic pain and pressure and decreased flow from catheter (placed x4 weeks ago) worsening over the last 24 hours. Denies fever, N/V, flank pain, hematuria.  Pt is managed by Dr. Vernie Ammons and has an appointment on Oct. 16th.   Past Medical History  Diagnosis Date  . ED (erectile dysfunction)   . Mild asthma SEASONAL -  PT HAS NO INHALER  . Diabetes mellitus ORAL MEDS    TYPE II  . Prostate cancer 05/01/2011 BX    ADENOCARCINOMA,gLEASON= 3+3=6,VOLUNE=68.8CC ,PSA=7.40  . Arthritis LUMBAR BACK, SHOULDER, LEFT KNEE, RIGHT WRIST  . DJD (degenerative joint disease)   . Chronic joint pain   . GERD (gastroesophageal reflux disease)   . Seasonal allergies   . 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  1997 (15 YRS AGO)    STATES NO DRUG USE SINCE  . History of head injury 2010-  CLOSED  W/ CONCUSSION--   NO RESIDUAL  . Eczema    Past Surgical History  Procedure Laterality Date  . Rotator cuff repair  01-10-2004    RIGHT  . Radioactive seed implant  09/07/2011    Procedure: RADIOACTIVE SEED IMPLANT;  Surgeon: Garnett Farm, MD;  Location: [facility];  Service: Urology;  Laterality: N/A;  86  seeds implanted  . Cystoscopy  09/07/2011    Procedure: CYSTOSCOPY;  Surgeon: Garnett Farm, MD;  Location: [facility];  Service: Urology;  Laterality: N/A;  no seeds noted in bladder   Family History  Problem Relation Age of Onset  . Prostate cancer Maternal Uncle    History  Substance Use Topics  .  Smoking status: Former Smoker    Types: Cigarettes    Quit date: 09/02/1973  . Smokeless tobacco: Never Used     Comment: QUIT SMOKING 1970'S  . Alcohol Use: No    Review of Systems 10 systems reviewed and found to be negative, except as noted in the HPI  Allergies  Review of patient's allergies indicates no known allergies.  Home Medications   Current Outpatient Rx  Name  Route  Sig  Dispense  Refill  . aspirin 81 MG chewable tablet   Oral   Chew 81 mg by mouth daily.         Marland Kitchen glimepiride (AMARYL) 4 MG tablet   Oral   Take 4 mg by mouth daily before breakfast.         . glipiZIDE (GLUCOTROL XL) 10 MG 24 hr tablet   Oral   Take 10 mg by mouth daily.         . metFORMIN (GLUCOPHAGE) 500 MG tablet   Oral   Take 500 mg by mouth 2 (two) times daily with a meal.         . Multiple Vitamin (MULTIVITAMIN WITH MINERALS) TABS   Oral   Take 1 tablet by mouth daily.          BP 138/93  Pulse 76  Temp(Src) 98.4 F (36.9 C) (Oral)  Resp 16  SpO2 100% Physical Exam  Nursing  note and vitals reviewed. Constitutional: He is oriented to person, place, and time. He appears well-developed and well-nourished. No distress.  HENT:  Head: Normocephalic and atraumatic.  Eyes: Conjunctivae and EOM are normal. Pupils are equal, round, and reactive to light.  Cardiovascular: Normal rate.   Pulmonary/Chest: Effort normal and breath sounds normal. No stridor.  Abdominal: Soft. Bowel sounds are normal. There is no tenderness.  Genitourinary:  No CVA TTP Bilaterally  Musculoskeletal: Normal range of motion. He exhibits no edema.  Neurological: He is alert and oriented to person, place, and time.  Psychiatric: He has a normal mood and affect.    ED Course  Procedures (including critical care time) Labs Review Labs Reviewed  POCT I-STAT CREATININE   Imaging Review No results found.  MDM   1. Obstructed Foley catheter, initial encounter     Filed Vitals:   01/14/13  1453  BP: 138/93  Pulse: 76  Temp: 98.4 F (36.9 C)  TempSrc: Oral  Resp: 16  SpO2: 100%     Cole Conrad is a 62 y.o. male with PMH of chronic urinary retention secondary to prostate Ca  S/p brachytherapy with decreased flow and lower abd pressure worsening over 24 hours. Foley flushed and full flow was reinstated. Creatinine is 1.2.   Urology consult from Dr. Lavonda Jumbo appreciated he recommends changing the catheter to with an 52fr coude.   Discussed case with attending who agrees with plan and stability to d/c to home.   Pt is hemodynamically stable, appropriate for, and amenable to discharge at this time. Pt verbalized understanding and agrees with care plan. All questions answered. Outpatient follow-up and specific return precautions discussed.    Note: Portions of this report may have been transcribed using voice recognition software. Every effort was made to ensure accuracy; however, inadvertent computerized transcription errors may be present     Wynetta Emery, PA-C 01/14/13 1727

## 2013-02-19 ENCOUNTER — Other Ambulatory Visit: Payer: Self-pay

## 2013-04-07 ENCOUNTER — Encounter (HOSPITAL_COMMUNITY): Payer: Self-pay | Admitting: Emergency Medicine

## 2013-04-07 ENCOUNTER — Emergency Department (HOSPITAL_COMMUNITY)
Admission: EM | Admit: 2013-04-07 | Discharge: 2013-04-07 | Disposition: A | Discharge status: Home / Self Care | Payer: Medicare Other | Attending: Emergency Medicine | Admitting: Emergency Medicine

## 2013-04-07 DIAGNOSIS — Z8719 Personal history of other diseases of the digestive system: Secondary | ICD-10-CM | POA: Insufficient documentation

## 2013-04-07 DIAGNOSIS — E119 Type 2 diabetes mellitus without complications: Secondary | ICD-10-CM | POA: Insufficient documentation

## 2013-04-07 DIAGNOSIS — Z8546 Personal history of malignant neoplasm of prostate: Secondary | ICD-10-CM | POA: Insufficient documentation

## 2013-04-07 DIAGNOSIS — Z9889 Other specified postprocedural states: Secondary | ICD-10-CM | POA: Insufficient documentation

## 2013-04-07 DIAGNOSIS — J45909 Unspecified asthma, uncomplicated: Secondary | ICD-10-CM | POA: Insufficient documentation

## 2013-04-07 DIAGNOSIS — Z87891 Personal history of nicotine dependence: Secondary | ICD-10-CM | POA: Insufficient documentation

## 2013-04-07 DIAGNOSIS — IMO0002 Reserved for concepts with insufficient information to code with codable children: Secondary | ICD-10-CM | POA: Insufficient documentation

## 2013-04-07 DIAGNOSIS — Z872 Personal history of diseases of the skin and subcutaneous tissue: Secondary | ICD-10-CM | POA: Insufficient documentation

## 2013-04-07 DIAGNOSIS — N390 Urinary tract infection, site not specified: Secondary | ICD-10-CM | POA: Insufficient documentation

## 2013-04-07 DIAGNOSIS — Z79899 Other long term (current) drug therapy: Secondary | ICD-10-CM | POA: Insufficient documentation

## 2013-04-07 DIAGNOSIS — G8929 Other chronic pain: Secondary | ICD-10-CM | POA: Insufficient documentation

## 2013-04-07 DIAGNOSIS — M129 Arthropathy, unspecified: Secondary | ICD-10-CM | POA: Insufficient documentation

## 2013-04-07 DIAGNOSIS — Z87828 Personal history of other (healed) physical injury and trauma: Secondary | ICD-10-CM | POA: Insufficient documentation

## 2013-04-07 DIAGNOSIS — Z7982 Long term (current) use of aspirin: Secondary | ICD-10-CM | POA: Insufficient documentation

## 2013-04-07 LAB — URINALYSIS, ROUTINE W REFLEX MICROSCOPIC
Glucose, UA: 250 mg/dL — AB
Protein, ur: >300 mg/dL — AB
Urobilinogen, UA: 1 mg/dL (ref 0.0–1.0)

## 2013-04-07 LAB — URINE MICROSCOPIC-ADD ON

## 2013-04-07 MED ORDER — CEPHALEXIN 500 MG PO CAPS: 500.0000 mg | ORAL_CAPSULE | Freq: Four times a day (QID) | ORAL | Status: DC

## 2013-04-07 MED ORDER — CIPROFLOXACIN HCL 500 MG PO TABS
500.0000 mg | ORAL_TABLET | Freq: Once | ORAL | Status: AC
  Administered 2013-04-07: 500 mg via ORAL
  Filled 2013-04-07: qty 1

## 2013-04-07 NOTE — ED Provider Notes (Signed)
CSN: [id number]     Arrival date & time 04/07/13  1017 History   First MD Initiated Contact with Patient 04/07/13 1123     Chief Complaint  Patient presents with  . Dysuria  . cloudy urine     (Consider location/radiation/quality/duration/timing/severity/associated sxs/prior Treatment) HPI Pt with chronic indwelling foley presents with c/o noticing sediment in foley bag and foul odor to his urine.  Also having some mild suprapubic abdominal pain.  No fever/chills.  No vomiting.  Has hx of prior UTIs that have had similar symptoms.  He has not had any treatment for these symptoms.  This foley has been in place approx 1 month.  There are no other associated systemic symptoms, there are no other alleviating or modifying factors. No decrease in urine output from foley  Past Medical History  Diagnosis Date  . ED (erectile dysfunction)   . Mild asthma SEASONAL -  PT HAS NO INHALER  . Diabetes mellitus ORAL MEDS    TYPE II  . Prostate cancer 05/01/2011 BX    ADENOCARCINOMA,gLEASON= 3+3=6,VOLUNE=68.8CC ,PSA=7.40  . Arthritis LUMBAR BACK, SHOULDER, LEFT KNEE, RIGHT WRIST  . DJD (degenerative joint disease)   . Chronic joint pain   . GERD (gastroesophageal reflux disease)   . Seasonal allergies   . 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  1997 (15 YRS AGO)    STATES NO DRUG USE SINCE  . History of head injury 2010-  CLOSED  W/ CONCUSSION--   NO RESIDUAL  . Eczema    Past Surgical History  Procedure Laterality Date  . Rotator cuff repair  01-10-2004    RIGHT  . Radioactive seed implant  09/07/2011    Procedure: RADIOACTIVE SEED IMPLANT;  Surgeon: Garnett Farm, MD;  Location: [facility];  Service: Urology;  Laterality: N/A;  84  seeds implanted  . Cystoscopy  09/07/2011    Procedure: CYSTOSCOPY;  Surgeon: Garnett Farm, MD;  Location: [facility];  Service: Urology;  Laterality: N/A;  no seeds noted in bladder    Family History  Problem Relation Age of Onset  . Prostate cancer Maternal Uncle    History  Substance Use Topics  . Smoking status: Former Smoker    Types: Cigarettes    Quit date: 09/02/1973  . Smokeless tobacco: Never Used     Comment: QUIT SMOKING 1970'S  . Alcohol Use: No    Review of Systems ROS reviewed and all otherwise negative except for mentioned in HPI  Allergies  Review of patient's allergies indicates no known allergies.  Home Medications   Current Outpatient Rx  Name  Route  Sig  Dispense  Refill  . aspirin 81 MG chewable tablet   Oral   Chew 81 mg by mouth daily.         Marland Kitchen glimepiride (AMARYL) 4 MG tablet   Oral   Take 4 mg by mouth daily before breakfast.         . glipiZIDE (GLUCOTROL XL) 10 MG 24 hr tablet   Oral   Take 10 mg by mouth daily.         . metFORMIN (GLUCOPHAGE) 500 MG tablet   Oral   Take 500 mg by mouth 2 (two) times daily with a meal.         . Multiple Vitamin (MULTIVITAMIN WITH MINERALS) TABS   Oral   Take 1 tablet by mouth daily.         Marland Kitchen  triamcinolone ointment (KENALOG) 0.5 %   Topical   Apply 1 application topically 2 (two) times daily.         . cephALEXin (KEFLEX) 500 MG capsule   Oral   Take 1 capsule (500 mg total) by mouth 4 (four) times daily.   40 capsule   0    BP 150/91  Pulse 69  Temp(Src) 98.4 F (36.9 C) (Oral)  Resp 18  SpO2 100% Vitals reviewed Physical Exam Physical Examination: General appearance - alert, well appearing, and in no distress Mental status - alert, oriented to person, place, and time Eyes - no scleral icterus, no conjunctival injection Mouth - mucous membranes moist, pharynx normal without lesions Chest - clear to auscultation, no wheezes, rales or rhonchi, symmetric air entry Heart - normal rate, regular rhythm, normal S1, S2, no murmurs, rubs, clicks or gallops Abdomen - soft, nontender, nondistended, no masses or organomegaly GU Male - foley catheter in place,  draining somewhat cloudy yellow urine Extremities - peripheral pulses normal, no pedal edema, no clubbing or cyanosis Skin - normal coloration and turgor, no rashes  ED Course  Procedures (including critical care time) Labs Review Labs Reviewed  URINALYSIS, ROUTINE W REFLEX MICROSCOPIC - Abnormal; Notable for the following:    Color, Urine AMBER (*)    APPearance CLOUDY (*)    Glucose, UA 250 (*)    Hgb urine dipstick LARGE (*)    Protein, ur >300 (*)    Nitrite POSITIVE (*)    Leukocytes, UA LARGE (*)    All other components within normal limits  URINE MICROSCOPIC-ADD ON - Abnormal; Notable for the following:    Bacteria, UA FEW (*)    All other components within normal limits  URINE CULTURE   Imaging Review No results found.  EKG Interpretation   None       MDM   1. Urinary tract infection   2. Chronic indwelling Foley catheter    Pt presenting with UTI, chronic indwelling foley catheter changed today in the ED.  Pt is overall nontoxic and well hydrated in appearance. Pt started on abx in ED.  Discharged with strict return precautions.  Pt agreeable with plan.    Ethelda Chick, MD 04/08/13 [id number]

## 2013-04-07 NOTE — ED Notes (Signed)
Pt states had foley cath placed before thanksgiving and noticed sediment in urine and foul odor. Pt states also having some pain that is bearable with urination

## 2013-04-09 LAB — URINE CULTURE

## 2013-04-10 ENCOUNTER — Telehealth (HOSPITAL_COMMUNITY): Payer: Self-pay | Admitting: Emergency Medicine

## 2013-04-10 NOTE — ED Notes (Signed)
Post ED Visit - Positive Culture Follow-up  Culture report reviewed by antimicrobial stewardship pharmacist: []  Wes Dulaney, Pharm.D., BCPS []  Celedonio Miyamoto, Pharm.D., BCPS []  Georgina Pillion, Pharm.D., BCPS []  [place], [place].D., BCPS, AAHIVP [x]  Estella Husk, Pharm.D., BCPS, AAHIVP  Positive urine culture Treated with Keflex, organism sensitive to the same and no further patient follow-up is required at this time.  Marcelle Overlie, Jenel Lucks 04/10/2013, 11:41 AM

## 2013-04-16 ENCOUNTER — Encounter (HOSPITAL_COMMUNITY): Payer: Self-pay | Admitting: Emergency Medicine

## 2013-04-16 ENCOUNTER — Emergency Department (HOSPITAL_COMMUNITY)
Admission: EM | Admit: 2013-04-16 | Discharge: 2013-04-16 | Disposition: A | Discharge status: Home / Self Care | Payer: Medicare Other | Attending: Emergency Medicine | Admitting: Emergency Medicine

## 2013-04-16 DIAGNOSIS — Z8719 Personal history of other diseases of the digestive system: Secondary | ICD-10-CM | POA: Insufficient documentation

## 2013-04-16 DIAGNOSIS — M1990 Unspecified osteoarthritis, unspecified site: Secondary | ICD-10-CM | POA: Insufficient documentation

## 2013-04-16 DIAGNOSIS — Z7982 Long term (current) use of aspirin: Secondary | ICD-10-CM | POA: Insufficient documentation

## 2013-04-16 DIAGNOSIS — Z9109 Other allergy status, other than to drugs and biological substances: Secondary | ICD-10-CM | POA: Insufficient documentation

## 2013-04-16 DIAGNOSIS — E119 Type 2 diabetes mellitus without complications: Secondary | ICD-10-CM | POA: Insufficient documentation

## 2013-04-16 DIAGNOSIS — Z87828 Personal history of other (healed) physical injury and trauma: Secondary | ICD-10-CM | POA: Insufficient documentation

## 2013-04-16 DIAGNOSIS — R339 Retention of urine, unspecified: Secondary | ICD-10-CM | POA: Insufficient documentation

## 2013-04-16 DIAGNOSIS — Z87448 Personal history of other diseases of urinary system: Secondary | ICD-10-CM | POA: Insufficient documentation

## 2013-04-16 DIAGNOSIS — Z87891 Personal history of nicotine dependence: Secondary | ICD-10-CM | POA: Insufficient documentation

## 2013-04-16 DIAGNOSIS — Z792 Long term (current) use of antibiotics: Secondary | ICD-10-CM | POA: Insufficient documentation

## 2013-04-16 DIAGNOSIS — N390 Urinary tract infection, site not specified: Secondary | ICD-10-CM | POA: Insufficient documentation

## 2013-04-16 DIAGNOSIS — Z79899 Other long term (current) drug therapy: Secondary | ICD-10-CM | POA: Insufficient documentation

## 2013-04-16 DIAGNOSIS — G8929 Other chronic pain: Secondary | ICD-10-CM | POA: Insufficient documentation

## 2013-04-16 DIAGNOSIS — J45909 Unspecified asthma, uncomplicated: Secondary | ICD-10-CM | POA: Insufficient documentation

## 2013-04-16 DIAGNOSIS — Z8546 Personal history of malignant neoplasm of prostate: Secondary | ICD-10-CM | POA: Insufficient documentation

## 2013-04-16 LAB — URINE MICROSCOPIC-ADD ON

## 2013-04-16 LAB — URINALYSIS, ROUTINE W REFLEX MICROSCOPIC
BILIRUBIN URINE: NEGATIVE
Ketones, ur: NEGATIVE mg/dL
Nitrite: NEGATIVE
PH: 7 (ref 5.0–8.0)
Protein, ur: 100 mg/dL — AB
SPECIFIC GRAVITY, URINE: 1.028 (ref 1.005–1.030)
UROBILINOGEN UA: 0.2 mg/dL (ref 0.0–1.0)

## 2013-04-16 MED ORDER — LIDOCAINE HCL 2 % EX GEL
Freq: Once | CUTANEOUS | Status: AC
  Administered 2013-04-16: 17:00:00 via URETHRAL
  Filled 2013-04-16: qty 10

## 2013-04-16 NOTE — ED Notes (Signed)
Pt reports being seen on 04/07/2013 was diagnosed with UTI given cephalexin and foley placed. Pt reports drinking 6 bottles of water with minimal output.

## 2013-04-16 NOTE — ED Notes (Signed)
With Bladder scanner above 213ml

## 2013-04-16 NOTE — ED Notes (Signed)
EDP request to hold irrigation to see how much output first.

## 2013-04-16 NOTE — ED Notes (Signed)
EDP at bedside  

## 2013-04-16 NOTE — ED Provider Notes (Signed)
CSN: [id number]     Arrival date & time 04/16/13  1423 History   First MD Initiated Contact with Patient 04/16/13 1535     Chief Complaint  Patient presents with  . Abdominal Pain  . Urinary Tract Infection   (Consider location/radiation/quality/duration/timing/severity/associated sxs/prior Treatment) HPI Comments: Cole Conrad is a 63 y.o. male here for evaluation of decreased ability to pass urine into his Foley catheter bag. This started this morning. He was treated for urinary tract infection, in the emergency department, about one week ago. He denies recent fever, chills, nausea, vomiting, weakness, or dizziness. There are no other known modifying factors.  Patient is a 63 y.o. male presenting with abdominal pain and urinary tract infection. The history is provided by the patient.  Abdominal Pain Urinary Tract Infection Associated symptoms include abdominal pain.    Past Medical History  Diagnosis Date  . ED (erectile dysfunction)   . Mild asthma SEASONAL -  PT HAS NO INHALER  . Diabetes mellitus ORAL MEDS    TYPE II  . Prostate cancer 05/01/2011 BX    ADENOCARCINOMA,gLEASON= 3+3=6,VOLUNE=68.8CC ,PSA=7.40  . Arthritis LUMBAR BACK, SHOULDER, LEFT KNEE, RIGHT WRIST  . DJD (degenerative joint disease)   . Chronic joint pain   . GERD (gastroesophageal reflux disease)   . Seasonal allergies   . 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  1997 (15 YRS AGO)    [place]  . History of head injury 2010-  CLOSED  W/ CONCUSSION--   NO RESIDUAL  . Eczema    Past Surgical History  Procedure Laterality Date  . Rotator cuff repair  01-10-2004    RIGHT  . Radioactive seed implant  09/07/2011    Procedure: RADIOACTIVE SEED IMPLANT;  Surgeon: Claybon Jabs, MD;  Location: [facility];  Service: Urology;  Laterality: N/A;  86  seeds implanted  . Cystoscopy  09/07/2011    Procedure: CYSTOSCOPY;  Surgeon: Claybon Jabs, MD;  Location: [facility];  Service: Urology;  Laterality: N/A;  no seeds noted in bladder   Family History  Problem Relation Age of Onset  . Prostate cancer Maternal Uncle    History  Substance Use Topics  . Smoking status: Former Smoker    Types: Cigarettes    Quit date: 09/02/1973  . Smokeless tobacco: Never Used     Comment: QUIT SMOKING 1970'S  . Alcohol Use: No    Review of Systems  Gastrointestinal: Positive for abdominal pain.  All other systems reviewed and are negative.    Allergies  Review of patient's allergies indicates no known allergies.  Home Medications   Current Outpatient Rx  Name  Route  Sig  Dispense  Refill  . aspirin 81 MG chewable tablet   Oral   Chew 81 mg by mouth daily.         . cephALEXin (KEFLEX) 500 MG capsule   Oral   Take 1 capsule (500 mg total) by mouth 4 (four) times daily.   40 capsule   0   . glimepiride (AMARYL) 4 MG tablet   Oral   Take 4 mg by mouth daily before breakfast.         . glipiZIDE (GLUCOTROL XL) 10 MG 24 hr tablet   Oral   Take 10 mg by mouth daily.         . metFORMIN (GLUCOPHAGE) 500 MG tablet   Oral  Take 500 mg by mouth 2 (two) times daily with a meal.         . Multiple Vitamin (MULTIVITAMIN WITH MINERALS) TABS   Oral   Take 1 tablet by mouth daily.         Marland Kitchen triamcinolone ointment (KENALOG) 0.5 %   Topical   Apply 1 application topically 2 (two) times daily.          BP 168/93  Pulse 72  Temp(Src) 98 F (36.7 C) (Oral)  Resp 20  SpO2 100% Physical Exam  Nursing note and vitals reviewed. Constitutional: He is oriented to person, place, and time. He appears well-developed and well-nourished.  HENT:  Head: Normocephalic and atraumatic.  Right Ear: External ear normal.  Left Ear: External ear normal.  Eyes: Conjunctivae and EOM are normal. Pupils are equal, round, and reactive to light.  Neck: Normal range of motion and phonation normal. Neck supple.   Cardiovascular: Normal rate, regular rhythm, normal heart sounds and intact distal pulses.   Pulmonary/Chest: Effort normal and breath sounds normal. He exhibits no bony tenderness.  Abdominal: Soft. Normal appearance. He exhibits mass (Suprapubic, consistent with bladder). There is tenderness (Suprapubic, mild). There is no rebound and no guarding.  Musculoskeletal: Normal range of motion.  Neurological: He is alert and oriented to person, place, and time. No cranial nerve deficit or sensory deficit. He exhibits normal muscle tone. Coordination normal.  Skin: Skin is warm, dry and intact.  Psychiatric: He has a normal mood and affect. His behavior is normal. Judgment and thought content normal.    ED Course  Procedures (including critical care time)  Medications  lidocaine (XYLOCAINE) 2 % jelly ( Urethral Given 04/16/13 1641)     Patient Vitals for the past 24 hrs:  BP Temp Temp src Pulse Resp SpO2  04/16/13 1756 168/93 mmHg 98 F (36.7 C) Oral 72 20 100 %  04/16/13 1514 152/118 mmHg 97.8 F (36.6 C) Oral 80 18 100 %     Foley catheterization by me; done after failed attempt by nursing. Using sterile technique, Xylocaine gel was instilled into the urethra. Then a 29 [place] Foley was placed with minimal difficulty, and immediate drainage of cloudy yellow urine. Patient had symptomatic relief of his suprapubic discomfort after placement of the Foley.        Labs Review Labs Reviewed  URINALYSIS, ROUTINE W REFLEX MICROSCOPIC - Abnormal; Notable for the following:    APPearance CLOUDY (*)    Glucose, UA >1000 (*)    Hgb urine dipstick LARGE (*)    Protein, ur 100 (*)    Leukocytes, UA MODERATE (*)    All other components within normal limits  URINE MICROSCOPIC-ADD ON - Abnormal; Notable for the following:    Bacteria, UA FEW (*)    All other components within normal limits  URINE CULTURE  URINE CULTURE   Imaging Review No results found.  EKG Interpretation   None        MDM   1. Urinary retention   2. UTI (lower urinary tract infection)    Urinary retention, associated with recent UTI, currently under treatment appropriately with Keflex, for Escherichia coli, sensitive to the same. He is improved, with placement of the new Foley catheter.  The patient appears reasonably screened and/or stabilized for discharge and I doubt any other medical condition or other [facility] requiring further screening, evaluation, or treatment in the ED at this time prior to discharge.  Nursing Notes Reviewed/ Care Coordinated,  and agree without changes. Applicable Imaging Reviewed.  Interpretation of Laboratory Data incorporated into ED treatment   Plan: Home Medications- usual, continue Keflex; Home Treatments and Observation- rest, fluids; return here if the recommended treatment, does not improve the symptoms; Recommended follow up- Urology check up 1 week      Richarda Blade, MD 04/16/13 [phone]

## 2013-04-16 NOTE — ED Notes (Signed)
Foley inserted and withdrew because of resistance met. After withdrawing catheter white debris and urine output of 50 ml. Witnessed by nurse. EDP notified.

## 2013-04-16 NOTE — Discharge Instructions (Signed)
Acute Urinary Retention, Male °Acute urinary retention is the temporary inability to urinate. °This is a common problem in older men. As men age their prostates become larger and block the flow of urine from the bladder. This is usually a problem that has come on gradually.  °HOME CARE INSTRUCTIONS °If you are sent home with a Foley catheter and a drainage system, you will need to discuss the best course of action with your health care provider. While the catheter is in, maintain a good intake of fluids. Keep the drainage bag emptied and lower than your catheter. This is so that contaminated urine will not flow back into your bladder, which could lead to a urinary tract infection. °There are two main types of drainage bags. One is a large bag that usually is used at night. It has a good capacity that will allow you to sleep through the night without having to empty it. The second type is called a leg bag. It has a smaller capacity, so it needs to be emptied more frequently. However, the main advantage is that it can be attached by a leg strap and can go underneath your clothing, allowing you the freedom to move about or leave your home. °Only take over-the-counter or prescription medicines for pain, discomfort, or fever as directed by your health care provider.  °SEEK MEDICAL CARE IF: °· You develop a low-grade fever. °· You experience spasms or leakage of urine with the spasms. °SEEK IMMEDIATE MEDICAL CARE IF:  °· You develop chills or fever. °· Your catheter stops draining urine. °· Your catheter falls out. °· You start to develop increased bleeding that does not respond to rest and increased fluid intake. °MAKE SURE YOU: °· Understand these instructions. °· Will watch your condition. °· Will get help right away if you are not doing well or get worse. °Document Released: 07/09/2000 Document Revised: 12/03/2012 Document Reviewed: 09/11/2012 °Exit[employer]® Patient Information ©2014 [employer]. ° °

## 2013-04-16 NOTE — ED Notes (Signed)
Pt states that he has chronic uti and has an indwelling cath, states that he has not had but minimal output since this am, lower abd pain pacing floor states that he drqank 6 bottles of water with no output on an abx for uti.

## 2013-04-16 NOTE — ED Notes (Signed)
Urine in lab. Lab called to inform urine culture added. Form sent to lab.

## 2013-04-17 LAB — URINE CULTURE
COLONY COUNT: NO GROWTH
Culture: NO GROWTH

## 2013-04-24 ENCOUNTER — Encounter (HOSPITAL_COMMUNITY): Payer: Self-pay | Admitting: Emergency Medicine

## 2013-04-24 ENCOUNTER — Emergency Department (HOSPITAL_COMMUNITY)
Admission: EM | Admit: 2013-04-24 | Discharge: 2013-04-24 | Disposition: A | Discharge status: Home / Self Care | Payer: Medicare Other | Attending: Emergency Medicine | Admitting: Emergency Medicine

## 2013-04-24 DIAGNOSIS — N390 Urinary tract infection, site not specified: Secondary | ICD-10-CM | POA: Insufficient documentation

## 2013-04-24 DIAGNOSIS — M1990 Unspecified osteoarthritis, unspecified site: Secondary | ICD-10-CM | POA: Insufficient documentation

## 2013-04-24 DIAGNOSIS — R143 Flatulence: Secondary | ICD-10-CM

## 2013-04-24 DIAGNOSIS — M2550 Pain in unspecified joint: Secondary | ICD-10-CM | POA: Insufficient documentation

## 2013-04-24 DIAGNOSIS — Z8719 Personal history of other diseases of the digestive system: Secondary | ICD-10-CM | POA: Insufficient documentation

## 2013-04-24 DIAGNOSIS — Z79899 Other long term (current) drug therapy: Secondary | ICD-10-CM | POA: Insufficient documentation

## 2013-04-24 DIAGNOSIS — Z9109 Other allergy status, other than to drugs and biological substances: Secondary | ICD-10-CM | POA: Insufficient documentation

## 2013-04-24 DIAGNOSIS — G8929 Other chronic pain: Secondary | ICD-10-CM | POA: Insufficient documentation

## 2013-04-24 DIAGNOSIS — Z8546 Personal history of malignant neoplasm of prostate: Secondary | ICD-10-CM | POA: Insufficient documentation

## 2013-04-24 DIAGNOSIS — J45909 Unspecified asthma, uncomplicated: Secondary | ICD-10-CM | POA: Insufficient documentation

## 2013-04-24 DIAGNOSIS — Z87828 Personal history of other (healed) physical injury and trauma: Secondary | ICD-10-CM | POA: Insufficient documentation

## 2013-04-24 DIAGNOSIS — R142 Eructation: Secondary | ICD-10-CM | POA: Insufficient documentation

## 2013-04-24 DIAGNOSIS — B3749 Other urogenital candidiasis: Secondary | ICD-10-CM | POA: Insufficient documentation

## 2013-04-24 DIAGNOSIS — Z87891 Personal history of nicotine dependence: Secondary | ICD-10-CM | POA: Insufficient documentation

## 2013-04-24 DIAGNOSIS — E119 Type 2 diabetes mellitus without complications: Secondary | ICD-10-CM | POA: Insufficient documentation

## 2013-04-24 DIAGNOSIS — Z792 Long term (current) use of antibiotics: Secondary | ICD-10-CM | POA: Insufficient documentation

## 2013-04-24 DIAGNOSIS — Z7982 Long term (current) use of aspirin: Secondary | ICD-10-CM | POA: Insufficient documentation

## 2013-04-24 DIAGNOSIS — R141 Gas pain: Secondary | ICD-10-CM | POA: Insufficient documentation

## 2013-04-24 LAB — URINALYSIS, ROUTINE W REFLEX MICROSCOPIC
BILIRUBIN URINE: NEGATIVE
Glucose, UA: 500 mg/dL — AB
Ketones, ur: NEGATIVE mg/dL
Nitrite: NEGATIVE
PH: 6.5 (ref 5.0–8.0)
Protein, ur: 30 mg/dL — AB
SPECIFIC GRAVITY, URINE: 1.011 (ref 1.005–1.030)
UROBILINOGEN UA: 0.2 mg/dL (ref 0.0–1.0)

## 2013-04-24 LAB — URINE MICROSCOPIC-ADD ON

## 2013-04-24 MED ORDER — FLUCONAZOLE 150 MG PO TABS
150.0000 mg | ORAL_TABLET | Freq: Once | ORAL | Status: AC
  Administered 2013-04-24: 150 mg via ORAL
  Filled 2013-04-24: qty 1

## 2013-04-24 MED ORDER — FLUCONAZOLE 200 MG PO TABS: 200.0000 mg | ORAL_TABLET | Freq: Every day | ORAL | Status: DC

## 2013-04-24 NOTE — ED Notes (Signed)
Pt reports no seeing urine in urine bag from foley since 9pm last night. Pain and pressure to bladder area has increased. Pt reports eating and drinking normal.

## 2013-04-24 NOTE — Discharge Instructions (Signed)
Followup with urology to assure resolution of your symptoms. Return immediately for inability to urinate, worsening pain, fever or for any concerns.  Candida Infection, Adult A candida infection (also called yeast, fungus and Monilia infection) is an overgrowth of yeast that can occur anywhere on the body. A yeast infection commonly occurs in warm, moist body areas. Usually, the infection remains localized but can spread to become a systemic infection. A yeast infection may be a sign of a more severe disease such as diabetes, leukemia, or AIDS. A yeast infection can occur in both men and women. In women, Candida vaginitis is a vaginal infection. It is one of the most common causes of vaginitis. Men usually do not have symptoms or know they have an infection until other problems develop. Men may find out they have a yeast infection because their sex partner has a yeast infection. Uncircumcised men are more likely to get a yeast infection than circumcised men. This is because the uncircumcised glans is not exposed to air and does not remain as dry as that of a circumcised glans. Older adults may develop yeast infections around dentures. CAUSES  Women  Antibiotics.  Steroid medication taken for a long time.  Being overweight (obese).  Diabetes.  Poor immune condition.  Certain serious medical conditions.  Immune suppressive medications for organ transplant patients.  Chemotherapy.  Pregnancy.  Menstration.  Stress and fatigue.  Intravenous drug use.  Oral contraceptives.  Wearing tight-fitting clothes in the crotch area.  Catching it from a sex partner who has a yeast infection.  Spermicide.  Intravenous, urinary, or other catheters. Men  Catching it from a sex partner who has a yeast infection.  Having oral or anal sex with a person who has the infection.  Spermicide.  Diabetes.  Antibiotics.  Poor immune system.  Medications that suppress the immune  system.  Intravenous drug use.  Intravenous, urinary, or other catheters. SYMPTOMS  Women  Thick, white vaginal discharge.  Vaginal itching.  Redness and swelling in and around the vagina.  Irritation of the lips of the vagina and perineum.  Blisters on the vaginal lips and perineum.  Painful sexual intercourse.  Low blood sugar (hypoglycemia).  Painful urination.  Bladder infections.  Intestinal problems such as constipation, indigestion, bad breath, bloating, increase in gas, diarrhea, or loose stools. Men  Men may develop intestinal problems such as constipation, indigestion, bad breath, bloating, increase in gas, diarrhea, or loose stools.  Dry, cracked skin on the penis with itching or discomfort.  Jock itch.  Dry, flaky skin.  Athlete's foot.  Hypoglycemia. DIAGNOSIS  Women  A history and an exam are performed.  The discharge may be examined under a microscope.  A culture may be taken of the discharge. Men  A history and an exam are performed.  Any discharge from the penis or areas of cracked skin will be looked at under the microscope and cultured.  Stool samples may be cultured. TREATMENT  Women  Vaginal antifungal suppositories and creams.  Medicated creams to decrease irritation and itching on the outside of the vagina.  Warm compresses to the perineal area to decrease swelling and discomfort.  Oral antifungal medications.  Medicated vaginal suppositories or cream for repeated or recurrent infections.  Wash and dry the irritation areas before applying the cream.  Eating yogurt with lactobacillus may help with prevention and treatment.  Sometimes painting the vagina with gentian violet solution may help if creams and suppositories do not work. Men  Antifungal  creams and oral antifungal medications.  Sometimes treatment must continue for 30 days after the symptoms go away to prevent recurrence. HOME CARE INSTRUCTIONS  Women  Use  cotton underwear and avoid tight-fitting clothing.  Avoid colored, scented toilet paper and deodorant tampons or pads.  Do not douche.  Keep your diabetes under control.  Finish all the prescribed medications.  Keep your skin clean and dry.  Consume milk or yogurt with lactobacillus active culture regularly. If you get frequent yeast infections and think that is what the infection is, there are over-the-counter medications that you can get. If the infection does not show healing in 3 days, talk to your caregiver.  Tell your sex partner you have a yeast infection. Your partner may need treatment also, especially if your infection does not clear up or recurs. Men  Keep your skin clean and dry.  Keep your diabetes under control.  Finish all prescribed medications.  Tell your sex partner that you have a yeast infection so they can be treated if necessary. SEEK MEDICAL CARE IF:   Your symptoms do not clear up or worsen in one week after treatment.  You have an oral temperature above 102 F (38.9 C).  You have trouble swallowing or eating for a prolonged time.  You develop blisters on and around your vagina.  You develop vaginal bleeding and it is not your menstrual period.  You develop abdominal pain.  You develop intestinal problems as mentioned above.  You get weak or lightheaded.  You have painful or increased urination.  You have pain during sexual intercourse. MAKE SURE YOU:   Understand these instructions.  Will watch your condition.  Will get help right away if you are not doing well or get worse. Document Released: 05/10/2004 Document Revised: 06/25/2011 Document Reviewed: 08/22/2009 [facility] Patient Information 2014 [place].

## 2013-04-24 NOTE — ED Provider Notes (Signed)
CSN: [id number]     Arrival date & time 04/24/13  0003 History   First MD Initiated Contact with Patient 04/24/13 0138     Chief Complaint  Patient presents with  . Urinary Retention   (Consider location/radiation/quality/duration/timing/severity/associated sxs/prior Treatment) HPI Patient with a history of recurrent UTIs and urinary retention was seen on 04/16/13 and diagnosed with a urinary tract infection. Patient had a Foley catheter at that time. He is discharged home with Keflex. The patient denies having any constitutional symptoms such as fever, chills, fatigue. He is presenting with acute urinary retention starting at 9 PM this evening. His urine remains cloudy and he states he does not believe the antibiotic was working. He is having distention and abdominal discomfort in the suprapubic region. Past Medical History  Diagnosis Date  . ED (erectile dysfunction)   . Mild asthma SEASONAL -  PT HAS NO INHALER  . Diabetes mellitus ORAL MEDS    TYPE II  . Prostate cancer 05/01/2011 BX    ADENOCARCINOMA,gLEASON= 3+3=6,VOLUNE=68.8CC ,PSA=7.40  . Arthritis LUMBAR BACK, SHOULDER, LEFT KNEE, RIGHT WRIST  . DJD (degenerative joint disease)   . Chronic joint pain   . GERD (gastroesophageal reflux disease)   . Seasonal allergies   . 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  1997 (15 YRS AGO)    [place]  . History of head injury 2010-  CLOSED  W/ CONCUSSION--   NO RESIDUAL  . Eczema    Past Surgical History  Procedure Laterality Date  . Rotator cuff repair  01-10-2004    RIGHT  . Radioactive seed implant  09/07/2011    Procedure: RADIOACTIVE SEED IMPLANT;  Surgeon: Claybon Jabs, MD;  Location: [facility];  Service: Urology;  Laterality: N/A;  86  seeds implanted  . Cystoscopy  09/07/2011    Procedure: CYSTOSCOPY;  Surgeon: Claybon Jabs, MD;  Location: [facility];  Service: Urology;  Laterality:  N/A;  no seeds noted in bladder   Family History  Problem Relation Age of Onset  . Prostate cancer Maternal Uncle    History  Substance Use Topics  . Smoking status: Former Smoker    Types: Cigarettes    Quit date: 09/02/1973  . Smokeless tobacco: Never Used     Comment: QUIT SMOKING 1970'S  . Alcohol Use: No    Review of Systems  Constitutional: Negative for fever, chills and fatigue.  Respiratory: Negative for shortness of breath.   Cardiovascular: Negative for chest pain.  Gastrointestinal: Positive for abdominal pain. Negative for nausea, vomiting and diarrhea.  Genitourinary: Positive for difficulty urinating. Negative for frequency, hematuria, flank pain, scrotal swelling, penile pain and testicular pain.  Skin: Negative for rash and wound.  Neurological: Negative for dizziness, weakness, light-headedness, numbness and headaches.  All other systems reviewed and are negative.    Allergies  Review of patient's allergies indicates no known allergies.  Home Medications   Current Outpatient Rx  Name  Route  Sig  Dispense  Refill  . aspirin 81 MG chewable tablet   Oral   Chew 81 mg by mouth daily.         . cephALEXin (KEFLEX) 500 MG capsule   Oral   Take 1 capsule (500 mg total) by mouth 4 (four) times daily.   40 capsule   0   . glimepiride (AMARYL) 4 MG tablet   Oral   Take 4 mg  by mouth daily before breakfast.         . glipiZIDE (GLUCOTROL XL) 10 MG 24 hr tablet   Oral   Take 10 mg by mouth daily.         . metFORMIN (GLUCOPHAGE) 500 MG tablet   Oral   Take 500 mg by mouth 2 (two) times daily with a meal.         . Multiple Vitamin (MULTIVITAMIN WITH MINERALS) TABS   Oral   Take 1 tablet by mouth daily.         Marland Kitchen triamcinolone ointment (KENALOG) 0.5 %   Topical   Apply 1 application topically 2 (two) times daily.          BP 162/93  Pulse 81  Temp(Src) 98.1 F (36.7 C) (Oral)  Resp 18  SpO2 99% Physical Exam  Nursing note and  vitals reviewed. Constitutional: He is oriented to person, place, and time. He appears well-developed and well-nourished. No distress.  HENT:  Head: Normocephalic and atraumatic.  Mouth/Throat: Oropharynx is clear and moist.  Eyes: EOM are normal. Pupils are equal, round, and reactive to light.  Neck: Normal range of motion. Neck supple.  Cardiovascular: Normal rate and regular rhythm.   Pulmonary/Chest: Effort normal and breath sounds normal. No respiratory distress. He has no wheezes. He has no rales.  Abdominal: Soft. Bowel sounds are normal. He exhibits no distension and no mass. There is no tenderness. There is no rebound and no guarding.  Genitourinary: No penile tenderness.  Penis without discharge. No testicular tenderness. No inguinal masses.  Musculoskeletal: Normal range of motion. He exhibits no edema and no tenderness.  No CVA tenderness bilaterally.  Neurological: He is alert and oriented to person, place, and time.  Skin: Skin is warm and dry. No rash noted. No erythema.  Psychiatric: He has a normal mood and affect. His behavior is normal.    ED Course  Procedures (including critical care time) Labs Review Labs Reviewed  URINALYSIS, ROUTINE W REFLEX MICROSCOPIC   Imaging Review No results found.  EKG Interpretation   None       MDM  Patient had Foley catheter removed prior to my exam. He is having no abdominal tenderness or distention now. He is able to urinate on his own. He's had a thick white discharge him from the urethra and surrounding the tip of the previously placed urinary catheter.  Based on prior UA patient had many yeast cells in urine. His culture grew out no bacteria.   Start the patient on Diflucan 200 mg for the next 2 weeks. Patient is advised to followup with urology. Return precautions have been given and patient voiced understanding.    Julianne Rice, MD 04/24/13 [phone]

## 2013-04-25 LAB — URINE CULTURE: Colony Count: 40000

## 2013-05-01 ENCOUNTER — Emergency Department (HOSPITAL_COMMUNITY)
Admission: EM | Admit: 2013-05-01 | Discharge: 2013-05-01 | Disposition: A | Discharge status: Home / Self Care | Payer: Medicare Other | Attending: Emergency Medicine | Admitting: Emergency Medicine

## 2013-05-01 ENCOUNTER — Encounter (HOSPITAL_COMMUNITY): Payer: Self-pay | Admitting: Emergency Medicine

## 2013-05-01 DIAGNOSIS — Z87891 Personal history of nicotine dependence: Secondary | ICD-10-CM | POA: Insufficient documentation

## 2013-05-01 DIAGNOSIS — G8929 Other chronic pain: Secondary | ICD-10-CM | POA: Insufficient documentation

## 2013-05-01 DIAGNOSIS — M1990 Unspecified osteoarthritis, unspecified site: Secondary | ICD-10-CM | POA: Insufficient documentation

## 2013-05-01 DIAGNOSIS — IMO0002 Reserved for concepts with insufficient information to code with codable children: Secondary | ICD-10-CM | POA: Insufficient documentation

## 2013-05-01 DIAGNOSIS — Z8619 Personal history of other infectious and parasitic diseases: Secondary | ICD-10-CM | POA: Insufficient documentation

## 2013-05-01 DIAGNOSIS — Z792 Long term (current) use of antibiotics: Secondary | ICD-10-CM | POA: Insufficient documentation

## 2013-05-01 DIAGNOSIS — Z8719 Personal history of other diseases of the digestive system: Secondary | ICD-10-CM | POA: Insufficient documentation

## 2013-05-01 DIAGNOSIS — E119 Type 2 diabetes mellitus without complications: Secondary | ICD-10-CM | POA: Insufficient documentation

## 2013-05-01 DIAGNOSIS — Z8546 Personal history of malignant neoplasm of prostate: Secondary | ICD-10-CM | POA: Insufficient documentation

## 2013-05-01 DIAGNOSIS — M1710 Unilateral primary osteoarthritis, unspecified knee: Secondary | ICD-10-CM | POA: Insufficient documentation

## 2013-05-01 DIAGNOSIS — M19039 Primary osteoarthritis, unspecified wrist: Secondary | ICD-10-CM | POA: Insufficient documentation

## 2013-05-01 DIAGNOSIS — N390 Urinary tract infection, site not specified: Secondary | ICD-10-CM

## 2013-05-01 DIAGNOSIS — Z7982 Long term (current) use of aspirin: Secondary | ICD-10-CM | POA: Insufficient documentation

## 2013-05-01 DIAGNOSIS — M19019 Primary osteoarthritis, unspecified shoulder: Secondary | ICD-10-CM | POA: Insufficient documentation

## 2013-05-01 DIAGNOSIS — R339 Retention of urine, unspecified: Secondary | ICD-10-CM

## 2013-05-01 DIAGNOSIS — J45909 Unspecified asthma, uncomplicated: Secondary | ICD-10-CM | POA: Insufficient documentation

## 2013-05-01 DIAGNOSIS — Z79899 Other long term (current) drug therapy: Secondary | ICD-10-CM | POA: Insufficient documentation

## 2013-05-01 DIAGNOSIS — Z872 Personal history of diseases of the skin and subcutaneous tissue: Secondary | ICD-10-CM | POA: Insufficient documentation

## 2013-05-01 DIAGNOSIS — Z87828 Personal history of other (healed) physical injury and trauma: Secondary | ICD-10-CM | POA: Insufficient documentation

## 2013-05-01 LAB — URINALYSIS, ROUTINE W REFLEX MICROSCOPIC
Bilirubin Urine: NEGATIVE
Glucose, UA: NEGATIVE mg/dL
Ketones, ur: NEGATIVE mg/dL
Nitrite: POSITIVE — AB
Protein, ur: 30 mg/dL — AB
Specific Gravity, Urine: 1.023 (ref 1.005–1.030)
Urobilinogen, UA: 0.2 mg/dL (ref 0.0–1.0)
pH: 6 (ref 5.0–8.0)

## 2013-05-01 LAB — URINE MICROSCOPIC-ADD ON

## 2013-05-01 LAB — GLUCOSE, CAPILLARY: Glucose-Capillary: 189 mg/dL — ABNORMAL HIGH (ref 70–99)

## 2013-05-01 MED ORDER — CIPROFLOXACIN HCL 500 MG PO TABS: 500.0000 mg | ORAL_TABLET | Freq: Two times a day (BID) | ORAL | Status: DC

## 2013-05-01 MED ORDER — CIPROFLOXACIN HCL 500 MG PO TABS
500.0000 mg | ORAL_TABLET | Freq: Once | ORAL | Status: AC
  Administered 2013-05-01: 500 mg via ORAL
  Filled 2013-05-01: qty 1

## 2013-05-01 NOTE — ED Provider Notes (Signed)
CSN: [id number]     Arrival date & time 05/01/13  2017 History   First MD Initiated Contact with Patient 05/01/13 2113     Chief Complaint  Patient presents with  . Urinary Retention   (Consider location/radiation/quality/duration/timing/severity/associated sxs/prior Treatment) HPI Comments: Pt is unable to urinate. Pt had prostate surgery about 1 year ago, has not been followng up with urology due to financial difficulty, but now has [place] insurance.  Pt still owed [employer]  Pt has been coming to the ED frequently due to difficulty urinating, UTI's, and most recently last week, told he had yeast infection and is on antifungal for 2 weeks.  Pt was doing well, but had difficulty again today, has pressure, difficulty urinating.  No abd pain otherwise, no N/V, no fevers.  Pt is diabetic, takes pills, no longer on insulin.  Denies unusual rash or discharge from penis.    The history is provided by the patient.    Past Medical History  Diagnosis Date  . ED (erectile dysfunction)   . Mild asthma SEASONAL -  PT HAS NO INHALER  . Diabetes mellitus ORAL MEDS    TYPE II  . Prostate cancer 05/01/2011 BX    ADENOCARCINOMA,gLEASON= 3+3=6,VOLUNE=68.8CC ,PSA=7.40  . Arthritis LUMBAR BACK, SHOULDER, LEFT KNEE, RIGHT WRIST  . DJD (degenerative joint disease)   . Chronic joint pain   . GERD (gastroesophageal reflux disease)   . Seasonal allergies   . 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  1997 (15 YRS AGO)    [place]  . History of head injury 2010-  CLOSED  W/ CONCUSSION--   NO RESIDUAL  . Eczema    Past Surgical History  Procedure Laterality Date  . Rotator cuff repair  01-10-2004    RIGHT  . Radioactive seed implant  09/07/2011    Procedure: RADIOACTIVE SEED IMPLANT;  Surgeon: Claybon Jabs, MD;  Location: [facility];  Service: Urology;  Laterality: N/A;  86  seeds implanted  . Cystoscopy  09/07/2011   Procedure: CYSTOSCOPY;  Surgeon: Claybon Jabs, MD;  Location: [facility];  Service: Urology;  Laterality: N/A;  no seeds noted in bladder   Family History  Problem Relation Age of Onset  . Prostate cancer Maternal Uncle    History  Substance Use Topics  . Smoking status: Former Smoker    Types: Cigarettes    Quit date: 09/02/1973  . Smokeless tobacco: Never Used     Comment: QUIT SMOKING 1970'S  . Alcohol Use: No    Review of Systems  Constitutional: Negative for fever and chills.  Gastrointestinal: Negative for nausea, vomiting, abdominal pain and diarrhea.  Genitourinary: Positive for decreased urine volume and difficulty urinating. Negative for discharge, scrotal swelling, penile pain and testicular pain.  Musculoskeletal: Negative for back pain.    Allergies  Review of patient's allergies indicates no known allergies.  Home Medications   Current Outpatient Rx  Name  Route  Sig  Dispense  Refill  . aspirin 81 MG chewable tablet   Oral   Chew 81 mg by mouth daily.         Marland Kitchen doxycycline (DORYX) 100 MG DR capsule   Oral   Take 100 mg by mouth 2 (two) times daily.         . fluconazole (DIFLUCAN) 200 MG tablet   Oral   Take 200 mg by mouth daily.         Marland Kitchen  gabapentin (NEURONTIN) 300 MG capsule   Oral   Take 300 mg by mouth 2 (two) times daily.         Marland Kitchen glipiZIDE (GLUCOTROL XL) 10 MG 24 hr tablet   Oral   Take 10 mg by mouth daily.         . metFORMIN (GLUCOPHAGE) 1000 MG tablet   Oral   Take 1,000 mg by mouth 2 (two) times daily with a meal.         . Multiple Vitamin (MULTIVITAMIN WITH MINERALS) TABS   Oral   Take 1 tablet by mouth daily.         . tamsulosin (FLOMAX) 0.4 MG CAPS capsule   Oral   Take 0.4 mg by mouth daily after supper.         . triamcinolone ointment (KENALOG) 0.5 %   Topical   Apply 1 application topically 2 (two) times daily.         . ciprofloxacin (CIPRO) 500 MG tablet   Oral   Take 1  tablet (500 mg total) by mouth 2 (two) times daily.   20 tablet   0    BP 149/95  Pulse 89  Temp(Src) 98.1 F (36.7 C) (Oral)  Resp 18  SpO2 100% Physical Exam  Nursing note and vitals reviewed. Constitutional: He appears well-developed and well-nourished. No distress.  Abdominal: Soft. He exhibits no distension. There is no tenderness. There is no rebound. Hernia confirmed negative in the right inguinal area and confirmed negative in the left inguinal area.  Genitourinary: Penis normal. No discharge found.  Lymphadenopathy:       Right: No inguinal adenopathy present.       Left: No inguinal adenopathy present.  Neurological: He is alert.  Skin: Skin is warm and dry. No rash noted.    ED Course  Procedures (including critical care time) Labs Review Labs Reviewed  URINALYSIS, ROUTINE W REFLEX MICROSCOPIC - Abnormal; Notable for the following:    APPearance CLOUDY (*)    Hgb urine dipstick MODERATE (*)    Protein, ur 30 (*)    Nitrite POSITIVE (*)    Leukocytes, UA MODERATE (*)    All other components within normal limits  GLUCOSE, CAPILLARY - Abnormal; Notable for the following:    Glucose-Capillary 189 (*)    All other components within normal limits  URINE MICROSCOPIC-ADD ON - Abnormal; Notable for the following:    Bacteria, UA MANY (*)    All other components within normal limits  URINE CULTURE   Imaging Review No results found.  EKG Interpretation   None       MDM   1. Urinary tract infection   2. Urinary retention      Foley already placed after bladder scan performed.  After cath placed, pt feels improved.  Not sepctic appearing.  UA shows TNTC WBC's, no yeast.  Likely infection related.  Now that pt had insurance, he is vowed to take better care of health and will follow up  With urology as outpt.  Will place in leg bag.  Urine culture sent.        Saddie Benders. Dorna Mai, MD 05/01/13 2216

## 2013-05-01 NOTE — Discharge Instructions (Signed)
Acute Urinary Retention, Male Acute urinary retention is the temporary inability to urinate. This is a common problem in older men. As men age their prostates become larger and block the flow of urine from the bladder. This is usually a problem that has come on gradually.  HOME CARE INSTRUCTIONS If you are sent home with a Foley catheter and a drainage system, you will need to discuss the best course of action with your health care provider. While the catheter is in, maintain a good intake of fluids. Keep the drainage bag emptied and lower than your catheter. This is so that contaminated urine will not flow back into your bladder, which could lead to a urinary tract infection. There are two main types of drainage bags. One is a large bag that usually is used at night. It has a good capacity that will allow you to sleep through the night without having to empty it. The second type is called a leg bag. It has a smaller capacity, so it needs to be emptied more frequently. However, the main advantage is that it can be attached by a leg strap and can go underneath your clothing, allowing you the freedom to move about or leave your home. Only take over-the-counter or prescription medicines for pain, discomfort, or fever as directed by your health care provider.  SEEK MEDICAL CARE IF:  You develop a low-grade fever.  You experience spasms or leakage of urine with the spasms. SEEK IMMEDIATE MEDICAL CARE IF:   You develop chills or fever.  Your catheter stops draining urine.  Your catheter falls out.  You start to develop increased bleeding that does not respond to rest and increased fluid intake. MAKE SURE YOU:  Understand these instructions.  Will watch your condition.  Will get help right away if you are not doing well or get worse. Document Released: 07/09/2000 Document Revised: 12/03/2012 Document Reviewed: 09/11/2012 [facility] Patient Information 2014 [place], [place].     Urinary Tract  Infection Urinary tract infections (UTIs) can develop anywhere along your urinary tract. Your urinary tract is your body's drainage system for removing wastes and extra water. Your urinary tract includes two kidneys, two ureters, a bladder, and a urethra. Your kidneys are a pair of bean-shaped organs. Each kidney is about the size of your fist. They are located below your ribs, one on each side of your spine. CAUSES Infections are caused by microbes, which are microscopic organisms, including fungi, viruses, and bacteria. These organisms are so small that they can only be seen through a microscope. Bacteria are the microbes that most commonly cause UTIs. SYMPTOMS  Symptoms of UTIs may vary by age and gender of the patient and by the location of the infection. Symptoms in young women typically include a frequent and intense urge to urinate and a painful, burning feeling in the bladder or urethra during urination. Older women and men are more likely to be tired, shaky, and weak and have muscle aches and abdominal pain. A fever may mean the infection is in your kidneys. Other symptoms of a kidney infection include pain in your back or sides below the ribs, nausea, and vomiting. DIAGNOSIS To diagnose a UTI, your caregiver will ask you about your symptoms. Your caregiver also will ask to provide a urine sample. The urine sample will be tested for bacteria and white blood cells. White blood cells are made by your body to help fight infection. TREATMENT  Typically, UTIs can be treated with medication. Because most  UTIs are caused by a bacterial infection, they usually can be treated with the use of antibiotics. The choice of antibiotic and length of treatment depend on your symptoms and the type of bacteria causing your infection. HOME CARE INSTRUCTIONS  If you were prescribed antibiotics, take them exactly as your caregiver instructs you. Finish the medication even if you feel better after you have only taken  some of the medication.  Drink enough water and fluids to keep your urine clear or pale yellow.  Avoid caffeine, tea, and carbonated beverages. They tend to irritate your bladder.  Empty your bladder often. Avoid holding urine for long periods of time.  Empty your bladder before and after sexual intercourse.  After a bowel movement, women should cleanse from front to back. Use each tissue only once. SEEK MEDICAL CARE IF:   You have back pain.  You develop a fever.  Your symptoms do not begin to resolve within 3 days. SEEK IMMEDIATE MEDICAL CARE IF:   You have severe back pain or lower abdominal pain.  You develop chills.  You have nausea or vomiting.  You have continued burning or discomfort with urination. MAKE SURE YOU:   Understand these instructions.  Will watch your condition.  Will get help right away if you are not doing well or get worse. Document Released: 01/10/2005 Document Revised: 10/02/2011 Document Reviewed: 05/11/2011 [facility] Patient Information 2014 [place].

## 2013-05-01 NOTE — ED Notes (Signed)
Pt arrived to the ED with a complaint of urinary retention.   Pt woke this AM with pain and the inability to urinate.

## 2013-05-03 LAB — URINE CULTURE: Colony Count: >=100000

## 2013-06-22 ENCOUNTER — Encounter (HOSPITAL_COMMUNITY): Payer: Self-pay | Admitting: Emergency Medicine

## 2013-06-22 ENCOUNTER — Emergency Department (HOSPITAL_COMMUNITY)
Admission: EM | Admit: 2013-06-22 | Discharge: 2013-06-22 | Disposition: A | Discharge status: Home / Self Care | Payer: Medicare Other | Attending: Emergency Medicine | Admitting: Emergency Medicine

## 2013-06-22 DIAGNOSIS — E119 Type 2 diabetes mellitus without complications: Secondary | ICD-10-CM | POA: Insufficient documentation

## 2013-06-22 DIAGNOSIS — C61 Malignant neoplasm of prostate: Secondary | ICD-10-CM | POA: Insufficient documentation

## 2013-06-22 DIAGNOSIS — G8929 Other chronic pain: Secondary | ICD-10-CM | POA: Insufficient documentation

## 2013-06-22 DIAGNOSIS — N529 Male erectile dysfunction, unspecified: Secondary | ICD-10-CM | POA: Insufficient documentation

## 2013-06-22 DIAGNOSIS — Z7982 Long term (current) use of aspirin: Secondary | ICD-10-CM | POA: Insufficient documentation

## 2013-06-22 DIAGNOSIS — Z87891 Personal history of nicotine dependence: Secondary | ICD-10-CM | POA: Insufficient documentation

## 2013-06-22 DIAGNOSIS — M129 Arthropathy, unspecified: Secondary | ICD-10-CM | POA: Insufficient documentation

## 2013-06-22 DIAGNOSIS — M1990 Unspecified osteoarthritis, unspecified site: Secondary | ICD-10-CM | POA: Insufficient documentation

## 2013-06-22 DIAGNOSIS — Z79899 Other long term (current) drug therapy: Secondary | ICD-10-CM | POA: Insufficient documentation

## 2013-06-22 DIAGNOSIS — N390 Urinary tract infection, site not specified: Secondary | ICD-10-CM | POA: Insufficient documentation

## 2013-06-22 DIAGNOSIS — K219 Gastro-esophageal reflux disease without esophagitis: Secondary | ICD-10-CM | POA: Insufficient documentation

## 2013-06-22 DIAGNOSIS — T83511A Infection and inflammatory reaction due to indwelling urethral catheter, initial encounter: Secondary | ICD-10-CM

## 2013-06-22 DIAGNOSIS — J45909 Unspecified asthma, uncomplicated: Secondary | ICD-10-CM | POA: Insufficient documentation

## 2013-06-22 LAB — URINALYSIS, ROUTINE W REFLEX MICROSCOPIC
Bilirubin Urine: NEGATIVE
Glucose, UA: >1000 mg/dL — AB
Ketones, ur: NEGATIVE mg/dL
Nitrite: POSITIVE — AB
PROTEIN: 30 mg/dL — AB
Specific Gravity, Urine: 1.027 (ref 1.005–1.030)
Urobilinogen, UA: 0.2 mg/dL (ref 0.0–1.0)
pH: 7 (ref 5.0–8.0)

## 2013-06-22 LAB — URINE MICROSCOPIC-ADD ON

## 2013-06-22 MED ORDER — LEVOFLOXACIN 750 MG PO TABS: 750.0000 mg | ORAL_TABLET | Freq: Every day | ORAL | Status: DC

## 2013-06-22 NOTE — ED Notes (Signed)
Pt here for his foley catheter replaced due to being in since Jan 16, on his last visit here. pt states that it's cheaper to come here than to go to his PCP/urologist. Pt states that he has been changing his intake fluids taht have been helping with his urination problems.

## 2013-06-22 NOTE — Discharge Instructions (Signed)
Urinary Tract Infection  Urinary tract infections (UTIs) can develop anywhere along your urinary tract. Your urinary tract is your body's drainage system for removing wastes and extra water. Your urinary tract includes two kidneys, two ureters, a bladder, and a urethra. Your kidneys are a pair of bean-shaped organs. Each kidney is about the size of your fist. They are located below your ribs, one on each side of your spine.  CAUSES  Infections are caused by microbes, which are microscopic organisms, including fungi, viruses, and bacteria. These organisms are so small that they can only be seen through a microscope. Bacteria are the microbes that most commonly cause UTIs.  SYMPTOMS   Symptoms of UTIs may vary by age and gender of the patient and by the location of the infection. Symptoms in young women typically include a frequent and intense urge to urinate and a painful, burning feeling in the bladder or urethra during urination. Older women and men are more likely to be tired, shaky, and weak and have muscle aches and abdominal pain. A fever may mean the infection is in your kidneys. Other symptoms of a kidney infection include pain in your back or sides below the ribs, nausea, and vomiting.  DIAGNOSIS  To diagnose a UTI, your caregiver will ask you about your symptoms. Your caregiver also will ask to provide a urine sample. The urine sample will be tested for bacteria and white blood cells. White blood cells are made by your body to help fight infection.  TREATMENT   Typically, UTIs can be treated with medication. Because most UTIs are caused by a bacterial infection, they usually can be treated with the use of antibiotics. The choice of antibiotic and length of treatment depend on your symptoms and the type of bacteria causing your infection.  HOME CARE INSTRUCTIONS   If you were prescribed antibiotics, take them exactly as your caregiver instructs you. Finish the medication even if you feel better after you  have only taken some of the medication.   Drink enough water and fluids to keep your urine clear or pale yellow.   Avoid caffeine, tea, and carbonated beverages. They tend to irritate your bladder.   Empty your bladder often. Avoid holding urine for long periods of time.   Empty your bladder before and after sexual intercourse.   After a bowel movement, women should cleanse from front to back. Use each tissue only once.  SEEK MEDICAL CARE IF:    You have back pain.   You develop a fever.   Your symptoms do not begin to resolve within 3 days.  SEEK IMMEDIATE MEDICAL CARE IF:    You have severe back pain or lower abdominal pain.   You develop chills.   You have nausea or vomiting.   You have continued burning or discomfort with urination.  MAKE SURE YOU:    Understand these instructions.   Will watch your condition.   Will get help right away if you are not doing well or get worse.  Document Released: 01/10/2005 Document Revised: 10/02/2011 Document Reviewed: 05/11/2011  [employer] Patient Information 2014 [employer].

## 2013-06-22 NOTE — ED Notes (Signed)
Attempted x2 to go into room to insert foley catheter- Kim with case management in room both times

## 2013-06-22 NOTE — ED Provider Notes (Signed)
CSN: [id number]     Arrival date & time 06/22/13  1152 History   First MD Initiated Contact with Patient 06/22/13 1242     Chief Complaint  Patient presents with  . foley cath replacement      (Consider location/radiation/quality/duration/timing/severity/associated sxs/prior Treatment) Patient is a 63 y.o. male presenting with dysuria. The history is provided by the patient. No language interpreter was used.  Dysuria This is a new problem. The current episode started yesterday. Pertinent negatives include no abdominal pain, fever, myalgias, nausea or vomiting. Associated symptoms comments: The patient has an indwelling foley catheter since December 2014, follow by Dr. Karsten Ro. He started noticing some burning and odor to urine yesterday. No abdominal pain. He states that the catheter seems to be draining without difficulty. He denies fever, nausea or vomiting. .    Past Medical History  Diagnosis Date  . ED (erectile dysfunction)   . Mild asthma SEASONAL -  PT HAS NO INHALER  . Diabetes mellitus ORAL MEDS    TYPE II  . Prostate cancer 05/01/2011 BX    ADENOCARCINOMA,gLEASON= 3+3=6,VOLUNE=68.8CC ,PSA=7.40  . Arthritis LUMBAR BACK, SHOULDER, LEFT KNEE, RIGHT WRIST  . DJD (degenerative joint disease)   . Chronic joint pain   . GERD (gastroesophageal reflux disease)   . Seasonal allergies   . 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  1997 (15 YRS AGO)    [place]  . History of head injury 2010-  CLOSED  W/ CONCUSSION--   NO RESIDUAL  . Eczema    Past Surgical History  Procedure Laterality Date  . Rotator cuff repair  01-10-2004    RIGHT  . Radioactive seed implant  09/07/2011    Procedure: RADIOACTIVE SEED IMPLANT;  Surgeon: Claybon Jabs, MD;  Location: [facility];  Service: Urology;  Laterality: N/A;  84  seeds implanted  . Cystoscopy  09/07/2011    Procedure: CYSTOSCOPY;  Surgeon: Claybon Jabs, MD;   Location: [facility];  Service: Urology;  Laterality: N/A;  no seeds noted in bladder   Family History  Problem Relation Age of Onset  . Prostate cancer Maternal Uncle    History  Substance Use Topics  . Smoking status: Former Smoker    Types: Cigarettes    Quit date: 09/02/1973  . Smokeless tobacco: Never Used     Comment: QUIT SMOKING 1970'S  . Alcohol Use: No    Review of Systems  Constitutional: Negative for fever.  Respiratory: Negative for shortness of breath.   Gastrointestinal: Negative for nausea, vomiting and abdominal pain.  Genitourinary: Positive for dysuria. Negative for hematuria, penile swelling and testicular pain.       See HPI.  Musculoskeletal: Negative for myalgias.      Allergies  Review of patient's allergies indicates no known allergies.  Home Medications   Current Outpatient Rx  Name  Route  Sig  Dispense  Refill  . aspirin 81 MG chewable tablet   Oral   Chew 81 mg by mouth daily.         Marland Kitchen gabapentin (NEURONTIN) 300 MG capsule   Oral   Take 300 mg by mouth 2 (two) times daily.         Marland Kitchen glipiZIDE (GLUCOTROL XL) 10 MG 24 hr tablet   Oral   Take 10 mg by mouth daily.         . metFORMIN (GLUCOPHAGE) 1000 MG tablet  Oral   Take 1,000 mg by mouth 2 (two) times daily with a meal.         . Multiple Vitamin (MULTIVITAMIN WITH MINERALS) TABS   Oral   Take 1 tablet by mouth daily.         . tamsulosin (FLOMAX) 0.4 MG CAPS capsule   Oral   Take 0.4 mg by mouth daily after supper.         . triamcinolone ointment (KENALOG) 0.5 %   Topical   Apply 1 application topically 2 (two) times daily.          BP 147/86  Pulse 71  Temp(Src) 97.6 F (36.4 C) (Oral)  Resp 18  SpO2 99% Physical Exam  Constitutional: He is oriented to person, place, and time. He appears well-developed and well-nourished.  Neck: Normal range of motion.  Pulmonary/Chest: Effort normal.  Abdominal: Soft. Bowel sounds are normal.  There is no tenderness.  Genitourinary:  Foley catheter in place with clear but malodorous yellow urine in leg bag. No testicular tenderness or mass.   Musculoskeletal: Normal range of motion.  Neurological: He is alert and oriented to person, place, and time.  Skin: Skin is warm and dry.  Psychiatric: He has a normal mood and affect.    ED Course  Procedures (including critical care time) Labs Review Labs Reviewed  URINE CULTURE  URINALYSIS, ROUTINE W REFLEX MICROSCOPIC   Results for orders placed during the hospital encounter of 06/22/13  URINALYSIS, ROUTINE W REFLEX MICROSCOPIC      Result Value Ref Range   Color, Urine YELLOW  YELLOW   APPearance CLOUDY (*) CLEAR   Specific Gravity, Urine 1.027  1.005 - 1.030   pH 7.0  5.0 - 8.0   Glucose, UA >1000 (*) NEGATIVE mg/dL   Hgb urine dipstick LARGE (*) NEGATIVE   Bilirubin Urine NEGATIVE  NEGATIVE   Ketones, ur NEGATIVE  NEGATIVE mg/dL   Protein, ur 30 (*) NEGATIVE mg/dL   Urobilinogen, UA 0.2  0.0 - 1.0 mg/dL   Nitrite POSITIVE (*) NEGATIVE   Leukocytes, UA LARGE (*) NEGATIVE  URINE MICROSCOPIC-ADD ON      Result Value Ref Range   Squamous Epithelial / LPF RARE  RARE   WBC, UA TOO NUMEROUS TO COUNT  <3 WBC/hpf   RBC / HPF 11-20  <3 RBC/hpf   Bacteria, UA MANY (*) RARE    Imaging Review No results found.   EKG Interpretation None      MDM   Final diagnoses:  None    1. UTI 2. Indwelling foley catheter change  He is very well appearing. Abdomen and genatalia non-tender. He has evidence of UTI in urine and catheter changed without difficulty. Kim (Case Mgr) has discussed patient's request of in-home [facility] for monthly catheter change. He will see his doctor to further this request. Will treat with Levaquin and discharge home.     Dewaine Oats, PA-C 06/22/13 1459

## 2013-06-22 NOTE — ED Notes (Signed)
Patient reports mild discomfort at tip of penis.  Patient reports his urine has a foul odor since a few days ago.

## 2013-06-22 NOTE — ED Provider Notes (Signed)
Medical screening examination/treatment/procedure(s) were performed by non-physician practitioner and as supervising physician I was immediately available for consultation/collaboration.  Richarda Blade, MD 06/22/13 [phone]

## 2013-06-22 NOTE — Progress Notes (Signed)
   CARE MANAGEMENT ED NOTE 06/22/2013  Patient:  Cole Conrad, Cole Conrad   Account Number:  [id number]  Date Initiated:  06/22/2013  Documentation initiated by:  Jackelyn Poling  Subjective/Objective Assessment:   63 yr old male [facility] medicare complete/medicaid pt who states he is also applying for medicare (turning in papers on 06/24/13) Pt is seen at [facility] clinic by Cherylann Banas (was seen by Dr Sheryle Hail prior to him leaving facility) as "walk in"     Subjective/Objective Assessment Detail:   Pt brought to ED by his mother via car Last time catheter changed was on 05/01/13 by [facility] ED staff prior to that changed 04/08/14 by [facility] ED staff  Pt reports speaking with, Dr Simone Curia (Urologist) RN about  Pt reports not having the co pay/finances to pay Dr Karsten Ro to be seen  Roselyn Reef at [facility] office confirms pt tx plan is to have f/c changed q month, and pt is eligible to return to see Dr Karsten Ro if he pays his co pay and an additional $10 to go towards his outstanding  balance Confirms pt was a no show for his 2/15 appt & has an 04/15 appt scheduled Roselyn Reef confirms Dr Larinda Buttery will not assist with home health orders until he sees the pt in 4/15  Kristen from [facility] confirms pt can have a HHRN to assist with change of catheter q month with training on cath care & insertion if pt is homebound     Action/Plan:   CM spoke with ED NP/PA about pt's voiced concern of need for cost effective means of getting his urinary foley catheter change q month CM spoke with the pt about his concern and agreed it would be more cost effective to have foley changed   Action/Plan Detail:   at pcp, urgent care or urology office versus Emergency room. CM spoke with pt about non compliance to urology appointments  Cm provided pt with the correct spelling for Dr Robyn Haber & Selina Cooley at [facility]   Anticipated DC Date:  06/22/2013     Status Recommendation to Physician:   Result of Recommendation:    Other  ED [facility]  Other  Outpatient Services - Pt will follow up    Choice offered to / List presented to:            Status of service:  Completed, signed off  ED Comments:  Pt given a copy of [place] home health agency list  ED Comments Detail:  06/22/13 1422 left voice message at [phone] for [facility] RN to attempt to get assist with home health orders for pt Pending a return call 06/22/13 1427 updated [facility] ED PA/NP 06/22/13  1456 CM updated pt and ED RN on plan for pt to f/u with pcp to initiate home health services until better financial relationship with urologist so that he can have his catheter changed monthly on the community level versus returning to ED monthly for change Pt voiced understanding and appreciation of services rendered

## 2013-06-24 LAB — URINE CULTURE

## 2013-06-24 NOTE — Progress Notes (Addendum)
Late entry for 06/23/13 1515 ED CM receive a call back from Cole Conrad at [facility] clinic CM reviewed pt voiced concerns with her and inquired if the facility would assist pt with foley catheter changes and/or home health services to have foley changes. Clinicals faxed to Cole Conrad and Dr Karsten Ro contact information provided Lynelle Smoke will contact pt to update him on home health services States pt has only been seen once at the clinic

## 2013-06-30 ENCOUNTER — Emergency Department (HOSPITAL_COMMUNITY)
Admission: EM | Admit: 2013-06-30 | Discharge: 2013-06-30 | Disposition: A | Discharge status: Home / Self Care | Payer: Medicare Other | Attending: Emergency Medicine | Admitting: Emergency Medicine

## 2013-06-30 ENCOUNTER — Encounter (HOSPITAL_COMMUNITY): Payer: Self-pay | Admitting: Emergency Medicine

## 2013-06-30 DIAGNOSIS — Z87448 Personal history of other diseases of urinary system: Secondary | ICD-10-CM | POA: Insufficient documentation

## 2013-06-30 DIAGNOSIS — R300 Dysuria: Secondary | ICD-10-CM

## 2013-06-30 DIAGNOSIS — E119 Type 2 diabetes mellitus without complications: Secondary | ICD-10-CM | POA: Insufficient documentation

## 2013-06-30 DIAGNOSIS — Z79899 Other long term (current) drug therapy: Secondary | ICD-10-CM | POA: Insufficient documentation

## 2013-06-30 DIAGNOSIS — Z7982 Long term (current) use of aspirin: Secondary | ICD-10-CM | POA: Insufficient documentation

## 2013-06-30 DIAGNOSIS — Z87891 Personal history of nicotine dependence: Secondary | ICD-10-CM | POA: Insufficient documentation

## 2013-06-30 DIAGNOSIS — G8929 Other chronic pain: Secondary | ICD-10-CM | POA: Insufficient documentation

## 2013-06-30 DIAGNOSIS — J45909 Unspecified asthma, uncomplicated: Secondary | ICD-10-CM | POA: Insufficient documentation

## 2013-06-30 DIAGNOSIS — Z8719 Personal history of other diseases of the digestive system: Secondary | ICD-10-CM | POA: Insufficient documentation

## 2013-06-30 DIAGNOSIS — N390 Urinary tract infection, site not specified: Secondary | ICD-10-CM | POA: Insufficient documentation

## 2013-06-30 DIAGNOSIS — Z792 Long term (current) use of antibiotics: Secondary | ICD-10-CM | POA: Insufficient documentation

## 2013-06-30 DIAGNOSIS — M1990 Unspecified osteoarthritis, unspecified site: Secondary | ICD-10-CM | POA: Insufficient documentation

## 2013-06-30 DIAGNOSIS — Z8546 Personal history of malignant neoplasm of prostate: Secondary | ICD-10-CM | POA: Insufficient documentation

## 2013-06-30 DIAGNOSIS — L259 Unspecified contact dermatitis, unspecified cause: Secondary | ICD-10-CM | POA: Insufficient documentation

## 2013-06-30 DIAGNOSIS — IMO0002 Reserved for concepts with insufficient information to code with codable children: Secondary | ICD-10-CM | POA: Insufficient documentation

## 2013-06-30 DIAGNOSIS — M2550 Pain in unspecified joint: Secondary | ICD-10-CM | POA: Insufficient documentation

## 2013-06-30 DIAGNOSIS — Z87828 Personal history of other (healed) physical injury and trauma: Secondary | ICD-10-CM | POA: Insufficient documentation

## 2013-06-30 LAB — URINE MICROSCOPIC-ADD ON

## 2013-06-30 LAB — URINALYSIS, ROUTINE W REFLEX MICROSCOPIC
Glucose, UA: NEGATIVE mg/dL
KETONES UR: NEGATIVE mg/dL
Nitrite: NEGATIVE
PROTEIN: 100 mg/dL — AB
Specific Gravity, Urine: 1.026 (ref 1.005–1.030)
UROBILINOGEN UA: 0.2 mg/dL (ref 0.0–1.0)
pH: 5.5 (ref 5.0–8.0)

## 2013-06-30 MED ORDER — CEFTRIAXONE SODIUM 1 G IJ SOLR
1.0000 g | Freq: Once | INTRAMUSCULAR | Status: AC
  Administered 2013-06-30: 1 g via INTRAMUSCULAR
  Filled 2013-06-30: qty 10

## 2013-06-30 MED ORDER — CEPHALEXIN 500 MG PO CAPS: 500.0000 mg | ORAL_CAPSULE | Freq: Four times a day (QID) | ORAL | Status: DC

## 2013-06-30 NOTE — ED Provider Notes (Signed)
CSN: [id number]     Arrival date & time 06/30/13  1955 History   First MD Initiated Contact with Patient 06/30/13 2116     Chief Complaint  Patient presents with  . Dysuria      HPI Pt was seen at 2130. Per pt, c/o gradual onset and persistence of constant dysuria for the past 2 days. States the "tip of my penis burns" when he urinates and "sometimes I see urine coming out around the catheter." Pt has had an indwelling foley for the past several months due to urinary retention, with last change on 06/22/2013.  Pt states he "can't get in to see Dr. Karsten Ro until next month."  States "it's cheaper to come to the ER anyway because I have to pay to go to doctor's offices."  Denies flank pain, no fevers, no abd pain, no N/V/D, no testicular pain/swelling, no hematuria, no rash. The symptoms have been associated with no other complaints. The patient has a significant history of similar symptoms previously, recently being evaluated for this complaint and multiple prior evals for same (5 ED visits in the past 3 months).      Past Medical History  Diagnosis Date  . ED (erectile dysfunction)   . Mild asthma SEASONAL -  PT HAS NO INHALER  . Diabetes mellitus ORAL MEDS    TYPE II  . Prostate cancer 05/01/2011 BX    ADENOCARCINOMA,gLEASON= 3+3=6,VOLUNE=68.8CC ,PSA=7.40  . Arthritis LUMBAR BACK, SHOULDER, LEFT KNEE, RIGHT WRIST  . DJD (degenerative joint disease)   . Chronic joint pain   . GERD (gastroesophageal reflux disease)   . Seasonal allergies   . 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  1997 (15 YRS AGO)    [place]  . History of head injury 2010-  CLOSED  W/ CONCUSSION--   NO RESIDUAL  . Eczema    Past Surgical History  Procedure Laterality Date  . Rotator cuff repair  01-10-2004    RIGHT  . Radioactive seed implant  09/07/2011    Procedure: RADIOACTIVE SEED IMPLANT;  Surgeon: Claybon Jabs, MD;  Location: [facility];  Service: Urology;  Laterality: N/A;  84  seeds implanted  . Cystoscopy  09/07/2011    Procedure: CYSTOSCOPY;  Surgeon: Claybon Jabs, MD;  Location: [facility];  Service: Urology;  Laterality: N/A;  no seeds noted in bladder   Family History  Problem Relation Age of Onset  . Prostate cancer Maternal Uncle    History  Substance Use Topics  . Smoking status: Former Smoker    Types: Cigarettes    Quit date: 09/02/1973  . Smokeless tobacco: Never Used     Comment: QUIT SMOKING 1970'S  . Alcohol Use: No    Review of Systems ROS: Statement: All systems negative except as marked or noted in the HPI; Constitutional: Negative for fever and chills. ; ; Eyes: Negative for eye pain, redness and discharge. ; ; ENMT: Negative for ear pain, hoarseness, nasal congestion, sinus pressure and sore throat. ; ; Cardiovascular: Negative for chest pain, palpitations, diaphoresis, dyspnea and peripheral edema. ; ; Respiratory: Negative for cough, wheezing and stridor. ; ; Gastrointestinal: Negative for nausea, vomiting, diarrhea, abdominal pain, blood in stool, hematemesis, jaundice and rectal bleeding. . ; ; Genitourinary: +dysuria. Negative for flank pain and hematuria. ; ; Genital:  No penile drainage or rash, no testicular pain or swelling, no scrotal rash or  swelling.;;  Musculoskeletal: Negative for back pain and neck pain. Negative for swelling and trauma.; ; Skin: Negative for pruritus, rash, abrasions, blisters, bruising and skin lesion.; ; Neuro: Negative for headache, lightheadedness and neck stiffness. Negative for weakness, altered level of consciousness , altered mental status, extremity weakness, paresthesias, involuntary movement, seizure and syncope.       Allergies  Review of patient's allergies indicates no known allergies.  Home Medications   Current Outpatient Rx  Name  Route  Sig  Dispense  Refill  . aspirin 81 MG chewable tablet   Oral   Chew 81 mg by  mouth daily.         Marland Kitchen gabapentin (NEURONTIN) 300 MG capsule   Oral   Take 300 mg by mouth 2 (two) times daily.         Marland Kitchen glipiZIDE (GLUCOTROL XL) 10 MG 24 hr tablet   Oral   Take 10 mg by mouth daily.         . metFORMIN (GLUCOPHAGE) 1000 MG tablet   Oral   Take 1,000 mg by mouth 2 (two) times daily with a meal.         . Multiple Vitamin (MULTIVITAMIN WITH MINERALS) TABS   Oral   Take 1 tablet by mouth daily.         . tamsulosin (FLOMAX) 0.4 MG CAPS capsule   Oral   Take 0.4 mg by mouth daily after supper.         . triamcinolone ointment (KENALOG) 0.5 %   Topical   Apply 1 application topically 2 (two) times daily.         Marland Kitchen levofloxacin (LEVAQUIN) 750 MG tablet   Oral   Take 1 tablet (750 mg total) by mouth daily.   5 tablet   0    BP 132/78  Pulse 92  Temp(Src) 98.7 F (37.1 C) (Oral)  Resp 20  Ht 5\' 8"  (1.727 m)  Wt 185 lb (83.915 kg)  BMI 28.14 kg/m2  SpO2 99% Physical Exam 2135: Physical examination:  Nursing notes reviewed; Vital signs and O2 SAT reviewed;  Constitutional: Well developed, Well nourished, Well hydrated, In no acute distress; Head:  Normocephalic, atraumatic; Eyes: EOMI, PERRL, No scleral icterus; ENMT: Mouth and pharynx normal, Mucous membranes moist; Neck: Supple, Full range of motion, No lymphadenopathy; Cardiovascular: Regular rate and rhythm, No gallop; Respiratory: Breath sounds clear & equal bilaterally, No rales, rhonchi, wheezes.  Speaking full sentences with ease, Normal respiratory effort/excursion; Chest: Nontender, Movement normal; Abdomen: Soft, Nontender, Nondistended, Normal bowel sounds; Genitourinary: No CVA tenderness. Foley draining clear yellow urine. No rash.; Extremities: Pulses normal, No tenderness, No edema, No calf edema or asymmetry.; Neuro: AA&Ox3, Major CN grossly intact.  Speech clear. No gross focal motor or sensory deficits in extremities.; Skin: Color normal, Warm, Dry.   ED Course  Procedures      EKG Interpretation None      MDM  MDM Reviewed: previous chart, nursing note and vitals Reviewed previous: labs Interpretation: labs     Results for orders placed during the hospital encounter of 06/30/13  URINALYSIS, ROUTINE W REFLEX MICROSCOPIC      Result Value Ref Range   Color, Urine YELLOW  YELLOW   APPearance TURBID (*) CLEAR   Specific Gravity, Urine 1.026  1.005 - 1.030   pH 5.5  5.0 - 8.0   Glucose, UA NEGATIVE  NEGATIVE mg/dL   Hgb urine dipstick LARGE (*) NEGATIVE   Bilirubin Urine SMALL (*)  NEGATIVE   Ketones, ur NEGATIVE  NEGATIVE mg/dL   Protein, ur 100 (*) NEGATIVE mg/dL   Urobilinogen, UA 0.2  0.0 - 1.0 mg/dL   Nitrite NEGATIVE  NEGATIVE   Leukocytes, UA LARGE (*) NEGATIVE  URINE MICROSCOPIC-ADD ON      Result Value Ref Range   WBC, UA 21-50  <3 WBC/hpf   RBC / HPF 7-10  <3 RBC/hpf   Urine-Other MANY YEAST       2255:  Bladder scan 0. Foley catheter flushed easily with quick fluid return. +UTI, UC pending. Will dose IM rocephin, rx keflex. Pt with 5 ED visits in the past 3 months for the same complaint. Has not f/u with Uro MD. Strongly encouraged to f/u with Uro MD. Verb understanding. Dx and testing d/w pt.  Questions answered.  Verb understanding, agreeable to d/c home with outpt f/u.     Alfonzo Feller, DO 07/03/13 1340

## 2013-06-30 NOTE — Discharge Instructions (Signed)
°Emergency Department Resource Guide °1) Find a Doctor and Pay Out of Pocket °Although you won't have to find out who is covered by your insurance plan, it is a good idea to ask around and get recommendations. You will then need to call the office and see if the doctor you have chosen will accept you as a new patient and what types of options they offer for patients who are self-pay. Some doctors offer discounts or will set up payment plans for their patients who do not have insurance, but you will need to ask so you aren't surprised when you get to your appointment. ° °2) Contact Your Local Health Department °Not all health departments have doctors that can see patients for sick visits, but many do, so it is worth a call to see if yours does. If you don't know where your local health department is, you can check in your phone book. The CDC also has a tool to help you locate your state's health department, and many state websites also have listings of all of their local health departments. ° °3) Find a Walk-in Clinic °If your illness is not likely to be very severe or complicated, you may want to try a walk in clinic. These are popping up all over the country in pharmacies, drugstores, and shopping centers. They're usually staffed by nurse practitioners or physician assistants that have been trained to treat common illnesses and complaints. They're usually fairly quick and inexpensive. However, if you have serious medical issues or chronic medical problems, these are probably not your best option. ° °No Primary Care Doctor: °- Call [facility] Connect at  832-8000 - they can help you locate a primary care doctor that  accepts your insurance, provides certain services, etc. °- Physician Referral Service- 1-800-533-3463 ° °Chronic Pain Problems: °Organization         Address  Phone   Notes  °[facility]  ([phone]) [phone] Patients need to be referred by their primary care doctor.  ° °Medication  Assistance: °Organization         Address  Phone   Notes  °[place] [facility] [address] °[place], [place] [address] (336) 641-8030 --Must be a resident of [place] °-- Must have NO insurance coverage whatsoever (no Medicaid/ Medicare, etc.) °-- The pt. MUST have a primary care doctor that directs their care regularly and follows them in the community °  °[facility]  (866) 331-1348   °[employer]  (888) 892-1162   ° °Agencies that provide inexpensive medical care: °Organization         Address  Phone   Notes  °[facility]  (336) 832-8035   °[facility]    (336) 832-7272   °[facility] [address] °[place],  [address] (336) 832-4777   °[facility] [address], °[place] (336) 271-4999   °[facility]    (336) 373-0678   °[facility]    (336) 272-1050   °[facility] ° [address], [place] Phone:  (336) 832-4444, Fax:  (336) 832-4440 Hours of Operation:  9 am - 6 pm, M-F.  Also accepts Medicaid/Medicare and self-pay.  °[facility] ° [address],  Phone: (336) 832-3150, Fax: (336) [phone]. Hours of Operation:  8:30 am - 5:30 pm, M-F.  Also accepts Medicaid and self-pay.  °[facility] [place] [address], [place] Phone: (336) 878-6027   °[facility] [address], [place], [place] (336)723-1848, Ext. 123 Mondays & Thursdays: 7-9 AM.  First 15 patients are seen on a first come, first serve basis. °  ° °Medicaid-accepting [place] Providers: ° °Organization         Address  Phone   Notes  °[facility] [address], [place] (336) 641-2100 Also accepts self-pay patients.  °[facility] [address], [place] ° (336) 856-9996   °[facility] [address], [place]  (336) 288-8857   °[facility] [address] [place] [address], [place] (336) 299-7000   °Veita Bland [address], [place]  ° (336) 373-1557 Only accepts [place] Access Medicaid patients after they have their name applied to their card.  ° °Self-Pay (no insurance) in [place]: ° °Organization         Address  Phone   Notes  °Sickle Cell Patients, [facility] [address], [place] (336) 832-1970   °[facility] [address], [place] (336) 832-4400   °[facility] ° [address], [place] (336) 992-4800   °[facility]/Dr. Osei-Bonsu ° [address] [place] [address], [place] or [address], [place] (336) [phone] Phone number for both [place] and [place] locations is the same.  °[facility] [address], [place] (336) 299-0000   °[facility] [address] [place] [address], [place] or [address] (336) 852-7530 °(336) 878-2260   °[facility] [address], [place] (336) 350-1642, phone; (336) 294-5005, fax Sees patients 1st and 3rd Saturday of every month.  Must not qualify for public or private insurance (i.e. Medicaid, Medicare, [place] Health Choice, Veterans' Benefits) • Household income should be no more than 200% of the poverty level •The clinic cannot treat you if you are pregnant or think you are pregnant • Sexually transmitted diseases are not treated at the clinic.  ° ° °Dental Care: °Organization         Address  Phone  Notes  °[place] [facility] [address], [place] (336) 641-6152 Accepts children up to age 21 who are enrolled in Medicaid or [place] Health Choice; pregnant women with a Medicaid card; and children who have applied for Medicaid or [place] Health Choice, but were declined, whose parents can pay a reduced fee at time of service.  °[place]  [facility] [place]  [address], [place] (336) 641-7733 Accepts children up to age 21 who are enrolled in Medicaid or [place] Health Choice; pregnant women with a Medicaid card; and children who have applied for Medicaid or [place] Health Choice, but were declined, whose parents can pay a reduced fee at time of service.  °[facility] ° [address], [place] (336) 641-4533 Patients are seen by appointment only. Walk-ins are not accepted. [facility] will see patients 18 years of age and older. °Monday - Tuesday (8am-5pm) °Most Wednesdays (8:30-5pm) °$30 per visit, cash only  °[facility] ° [address], [place] (336) 641-4533 Patients are seen by appointment only. Walk-ins are not accepted. [facility] will see patients 18 years of age and older. °One   Wednesday Evening (Monthly: Volunteer Based).  $30 per visit, cash only  °[facility]  (919) 537-3737 for adults; Children under age 4, call Graduate Pediatric Dentistry at ([phone]) [phone]. Children aged 4-14, please call ([phone]) [phone] to request a pediatric application. ° Dental services are provided in all areas of dental care including fillings, crowns and bridges, complete and partial dentures, implants, gum treatment, root canals, and extractions. Preventive care is also provided. Treatment is provided to both adults and children. °Patients are selected via a lottery and there is often a waiting list. °  °[facility] [address], °[place] ° (336) 763-8833 [email or web address] °  °[facility] [address], [place], [place] (336)723-1848, Ext. 123 Second and Fourth Thursday of each month, opens at 6:30 AM; Clinic ends at 9 AM.  Patients are seen on a first-come first-served basis, and a limited number are seen during each clinic.  ° °[facility] ° [address], [place], [place] (336) [phone]    Eligibility Requirements °You must have lived in [place], [place], or [place] counties for at least the last three months. °  You cannot be eligible for state or federal sponsored healthcare insurance, including Veterans Administration, Medicaid, or Medicare. °  You generally cannot be eligible for healthcare insurance through your employer.  °  How to apply: °Eligibility screenings are held every Tuesday and Wednesday afternoon from 1:00 pm until 4:00 pm. You do not need an appointment for the interview!  °[facility] [address], [place], [place] 336-631-2330   °[facility]  336-342-8273   °[facility]  336-703-3100   °[facility]  336-570-6415   ° °Behavioral Health Resources in the Community: °Intensive Outpatient Programs °Organization         Address  Phone  Notes  °[facility] [address], [place], [place] 336-878-6098   °[facility] [address], [place], [place] 336-832-9800   °[facility] [address], [place], [place] ° 336-882-2125   °[facility] [address],  °[place], [place] 1-800-853-5163 or 336-641-4981   °Substance Abuse Resources °Organization         Address  Phone  Notes  °[facility]  336-882-2125   °[facility]  336-784-9470   °[facility]  336-285-9073   °[facility]  336-845-3988   °Residential & Outpatient Substance Abuse Program  1-800-659-3381   °Psychological Services °Organization         Address  Phone  Notes  °[facility]  336- 832-9600   °[facility]  336- 378-7881   °[facility] [address], [place] 1-800-853-5163 or 336-641-4981   ° °Mobile Crisis Teams °Organization         Address  Phone  Notes  °[facility], Mobile Crisis Care Unit  1-877-626-1772   °Assertive °[facility] ° [address]  [place], [place] 336-834-9664   °Sharon DeEsch [address] °[place] 336-554-5454   ° °Self-Help/Support Groups °Organization         Address  Phone             Notes  °[facility] [place] - variety of support groups  336- 373-1402 Call for more information  °Narcotics Anonymous (NA), [facility] [address], °[place]  2 meetings at this location  ° °  Residential Treatment Programs Organization         Address  Phone  Notes  [facility] [address],    [place]  1-2287161805   [facility]  [address] [place] [address], [place], [place]   [place], [place] 628-061-3211 Admissions: 8am-3pm M-F  [facility] [address]    [place], [place] 045-409-8119   [facility] [address] [place], [place], [place]   [facility] [address],  [place], [place]   [facility] - Intensive Outpatient [address], [place], [place]   [facility] ([place] [address]  [place], [place] 1-(807)596-4823 or (438)120-7852   [facility] [address] [place], [place] Accepts Medicaid  [facility] [address],  [place] 1-952-094-7693 Substance Abuse/Addiction Treatment   [facility]         Address  Phone  Notes  [facility]  445-799-5679   Domenic Schwab, PhD [address] [place], [place]   443-275-0214 or [phone]   [facility] [place] [address] [place], [place] [phone]   [facility] [address], [place], [place] (478) 750-5770 Insurance/Medicaid/sponsorship through [facility] [address]                                    [place], [place] [phone] [facility] [address], [place] [phone]    Dr. Adele Schilder  [phone]   [facility] Dept. 1) [address] [place] 2) [place] 3)  [address], [place] [phone]  ([phone]   [place] [phone] or [phone] (After Hours)       Take the prescription as directed.  Call your regular Urologist tomorrow morning to schedule a follow up appointment this week.  Return to the Emergency Department immediately sooner if worsening.

## 2013-06-30 NOTE — ED Notes (Signed)
Per pt report: pt had a catheter replaced march 9th and this today pt noticed that pt there is no urine coming out.  Pt does reports some urine leaking around the side.  Pt reports the urinary issues are causing the abd pain. Pt a/o x4. Skin warm and dry.  NAD noted.  Pt reports having a leg bag.

## 2013-07-01 LAB — I-STAT TROPONIN, ED: Troponin i, poc: 0 ng/mL (ref 0.00–0.08)

## 2013-07-03 LAB — URINE CULTURE

## 2013-09-04 ENCOUNTER — Emergency Department (HOSPITAL_COMMUNITY)
Admission: EM | Admit: 2013-09-04 | Discharge: 2013-09-04 | Disposition: A | Discharge status: Home / Self Care | Payer: Medicare Other | Attending: Emergency Medicine | Admitting: Emergency Medicine

## 2013-09-04 ENCOUNTER — Encounter (HOSPITAL_COMMUNITY): Payer: Self-pay | Admitting: Emergency Medicine

## 2013-09-04 DIAGNOSIS — M129 Arthropathy, unspecified: Secondary | ICD-10-CM | POA: Insufficient documentation

## 2013-09-04 DIAGNOSIS — Z8546 Personal history of malignant neoplasm of prostate: Secondary | ICD-10-CM | POA: Insufficient documentation

## 2013-09-04 DIAGNOSIS — Z87891 Personal history of nicotine dependence: Secondary | ICD-10-CM | POA: Insufficient documentation

## 2013-09-04 DIAGNOSIS — N390 Urinary tract infection, site not specified: Secondary | ICD-10-CM | POA: Insufficient documentation

## 2013-09-04 DIAGNOSIS — Z87828 Personal history of other (healed) physical injury and trauma: Secondary | ICD-10-CM | POA: Insufficient documentation

## 2013-09-04 DIAGNOSIS — Z8719 Personal history of other diseases of the digestive system: Secondary | ICD-10-CM | POA: Insufficient documentation

## 2013-09-04 DIAGNOSIS — E119 Type 2 diabetes mellitus without complications: Secondary | ICD-10-CM | POA: Insufficient documentation

## 2013-09-04 DIAGNOSIS — Z7982 Long term (current) use of aspirin: Secondary | ICD-10-CM | POA: Insufficient documentation

## 2013-09-04 DIAGNOSIS — J45909 Unspecified asthma, uncomplicated: Secondary | ICD-10-CM | POA: Insufficient documentation

## 2013-09-04 DIAGNOSIS — R300 Dysuria: Secondary | ICD-10-CM

## 2013-09-04 DIAGNOSIS — Z79899 Other long term (current) drug therapy: Secondary | ICD-10-CM | POA: Insufficient documentation

## 2013-09-04 DIAGNOSIS — Z872 Personal history of diseases of the skin and subcutaneous tissue: Secondary | ICD-10-CM | POA: Insufficient documentation

## 2013-09-04 LAB — CBC WITH DIFFERENTIAL/PLATELET
BASOS ABS: 0 10*3/uL (ref 0.0–0.1)
Basophils Relative: 0 % (ref 0–1)
EOS PCT: 4 % (ref 0–5)
Eosinophils Absolute: 0.3 10*3/uL (ref 0.0–0.7)
HEMATOCRIT: 39 % (ref 39.0–52.0)
Hemoglobin: 12.9 g/dL — ABNORMAL LOW (ref 13.0–17.0)
LYMPHS ABS: 2 10*3/uL (ref 0.7–4.0)
LYMPHS PCT: 27 % (ref 12–46)
MCH: 29.6 pg (ref 26.0–34.0)
MCHC: 33.1 g/dL (ref 30.0–36.0)
MCV: 89.4 fL (ref 78.0–100.0)
Monocytes Absolute: 0.5 10*3/uL (ref 0.1–1.0)
Monocytes Relative: 7 % (ref 3–12)
NEUTROS ABS: 4.4 10*3/uL (ref 1.7–7.7)
Neutrophils Relative %: 62 % (ref 43–77)
Platelets: 295 10*3/uL (ref 150–400)
RBC: 4.36 MIL/uL (ref 4.22–5.81)
RDW: 13.6 % (ref 11.5–15.5)
WBC: 7.1 10*3/uL (ref 4.0–10.5)

## 2013-09-04 LAB — URINALYSIS, ROUTINE W REFLEX MICROSCOPIC
Bilirubin Urine: NEGATIVE
Bilirubin Urine: NEGATIVE
GLUCOSE, UA: NEGATIVE mg/dL
Glucose, UA: NEGATIVE mg/dL
Ketones, ur: NEGATIVE mg/dL
Ketones, ur: NEGATIVE mg/dL
NITRITE: POSITIVE — AB
Nitrite: POSITIVE — AB
PROTEIN: 30 mg/dL — AB
Protein, ur: >300 mg/dL — AB
SPECIFIC GRAVITY, URINE: 1.022 (ref 1.005–1.030)
SPECIFIC GRAVITY, URINE: 1.028 (ref 1.005–1.030)
UROBILINOGEN UA: 1 mg/dL (ref 0.0–1.0)
Urobilinogen, UA: 1 mg/dL (ref 0.0–1.0)
pH: 6.5 (ref 5.0–8.0)
pH: 5.5 (ref 5.0–8.0)

## 2013-09-04 LAB — BASIC METABOLIC PANEL
BUN: 14 mg/dL (ref 6–23)
CHLORIDE: 109 meq/L (ref 96–112)
CO2: 20 meq/L (ref 19–32)
CREATININE: 1.09 mg/dL (ref 0.50–1.35)
Calcium: 9.2 mg/dL (ref 8.4–10.5)
GFR calc Af Amer: 83 mL/min — ABNORMAL LOW (ref >90)
GFR calc non Af Amer: 72 mL/min — ABNORMAL LOW (ref >90)
GLUCOSE: 118 mg/dL — AB (ref 70–99)
Potassium: 4.3 mEq/L (ref 3.7–5.3)
Sodium: 143 mEq/L (ref 137–147)

## 2013-09-04 LAB — URINE MICROSCOPIC-ADD ON

## 2013-09-04 MED ORDER — HYDROCODONE-ACETAMINOPHEN 5-325 MG PO TABS: 1.0000 | ORAL_TABLET | Freq: Four times a day (QID) | ORAL | Status: DC | PRN

## 2013-09-04 MED ORDER — CEPHALEXIN 500 MG PO CAPS
500.0000 mg | ORAL_CAPSULE | Freq: Four times a day (QID) | ORAL | Status: DC
Start: 2013-09-04 — End: 2013-10-29

## 2013-09-04 NOTE — ED Notes (Addendum)
Pt c/o dysuria and hematuria x 2 weeks. Pt has foley catheter in place x 1 .5 years off and on. C/o of pain in genital area as well. Hx of prostate cancer.

## 2013-09-04 NOTE — ED Notes (Signed)
ALP bedside 

## 2013-09-04 NOTE — ED Notes (Signed)
Pt alert, nad, c/o possible UTI/yeast infection, onset unknown, pt states has Urology f/u on June 4th, has in [place] foley catheter with leg bag, pt states foley has been in place >30 days. Urine in bag cloudy, sediment noted

## 2013-09-04 NOTE — ED Notes (Signed)
Phlebotomy @ bedside  

## 2013-09-04 NOTE — ED Provider Notes (Signed)
CSN: [id number]     Arrival date & time 09/04/13  1214 History   First MD Initiated Contact with Patient 09/04/13 1236     Chief Complaint  Patient presents with  . Dysuria  . Hematuria     (Consider location/radiation/quality/duration/timing/severity/associated sxs/prior Treatment) HPI Comments: Patient presents emergency department with chief complaint of dysuria and hematuria. He has a chronic indwelling Foley catheter. States that his been in longer than a month. He states that he is saving money so that he can go back to his urologist. He states that for the past several days, he has noticed increased pain with urination. He denies fevers, chills, nausea, or vomiting. Denies any abdominal pain. There no aggravating or alleviating factors. Patient states that he has had this problem before, and believes that he has a UTI.  The history is provided by the patient. No language interpreter was used.    Past Medical History  Diagnosis Date  . ED (erectile dysfunction)   . Mild asthma SEASONAL -  PT HAS NO INHALER  . Diabetes mellitus ORAL MEDS    TYPE II  . Prostate cancer 05/01/2011 BX    ADENOCARCINOMA,gLEASON= 3+3=6,VOLUNE=68.8CC ,PSA=7.40  . Arthritis LUMBAR BACK, SHOULDER, LEFT KNEE, RIGHT WRIST  . DJD (degenerative joint disease)   . Chronic joint pain   . GERD (gastroesophageal reflux disease)   . Seasonal allergies   . 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  1997 (15 YRS AGO)    [place]  . History of head injury 2010-  CLOSED  W/ CONCUSSION--   NO RESIDUAL  . Eczema    Past Surgical History  Procedure Laterality Date  . Rotator cuff repair  01-10-2004    RIGHT  . Radioactive seed implant  09/07/2011    Procedure: RADIOACTIVE SEED IMPLANT;  Surgeon: Claybon Jabs, MD;  Location: [facility];  Service: Urology;  Laterality: N/A;  84  seeds implanted  . Cystoscopy  09/07/2011    Procedure:  CYSTOSCOPY;  Surgeon: Claybon Jabs, MD;  Location: [facility];  Service: Urology;  Laterality: N/A;  no seeds noted in bladder   Family History  Problem Relation Age of Onset  . Prostate cancer Maternal Uncle    History  Substance Use Topics  . Smoking status: Former Smoker    Types: Cigarettes    Quit date: 09/02/1973  . Smokeless tobacco: Never Used     Comment: QUIT SMOKING 1970'S  . Alcohol Use: No    Review of Systems  Constitutional: Negative for fever and chills.  Respiratory: Negative for shortness of breath.   Cardiovascular: Negative for chest pain.  Gastrointestinal: Negative for nausea, vomiting, diarrhea and constipation.  Genitourinary: Positive for dysuria and hematuria.      Allergies  Review of patient's allergies indicates no known allergies.  Home Medications   Prior to Admission medications   Medication Sig Start Date End Date Taking? Authorizing Provider  aspirin 81 MG chewable tablet Chew 81 mg by mouth daily.   Yes Historical Provider, MD  gabapentin (NEURONTIN) 300 MG capsule Take 300 mg by mouth 2 (two) times daily.   Yes Historical Provider, MD  glipiZIDE (GLUCOTROL XL) 10 MG 24 hr tablet Take 10 mg by mouth daily.   Yes Historical Provider, MD  metFORMIN (GLUCOPHAGE) 1000 MG tablet Take 1,000 mg by mouth 2 (two) times daily with a meal.   Yes Historical Provider,  MD  Multiple Vitamin (MULTIVITAMIN WITH MINERALS) TABS Take 1 tablet by mouth daily.   Yes Historical Provider, MD  tamsulosin (FLOMAX) 0.4 MG CAPS capsule Take 0.4 mg by mouth daily after supper.   Yes Historical Provider, MD   BP 148/90  Pulse 76  Temp(Src) 98.7 F (37.1 C) (Oral)  Resp 18  SpO2 99% Physical Exam  Nursing note and vitals reviewed. Constitutional: He is oriented to person, place, and time. He appears well-developed and well-nourished.  HENT:  Head: Normocephalic and atraumatic.  Eyes: Conjunctivae and EOM are normal. Pupils are equal, round, and  reactive to light. Right eye exhibits no discharge. Left eye exhibits no discharge. No scleral icterus.  Neck: Normal range of motion. Neck supple. No JVD present.  Cardiovascular: Normal rate, regular rhythm and normal heart sounds.  Exam reveals no gallop and no friction rub.   No murmur heard. Pulmonary/Chest: Effort normal and breath sounds normal. No respiratory distress. He has no wheezes. He has no rales. He exhibits no tenderness.  Abdominal: Soft. He exhibits no distension and no mass. There is no tenderness. There is no rebound and no guarding.  No focal abdominal tenderness, no RLQ tenderness or pain at McBurney's point, no RUQ tenderness or Murphy's sign, no left-sided abdominal tenderness, no fluid wave, or signs of peritonitis   Genitourinary:  Circumcised male, no lesions or abnormality about the penis, scrotum, testicles Patient has chronic indwelling Foley catheter  Musculoskeletal: Normal range of motion. He exhibits no edema and no tenderness.  Neurological: He is alert and oriented to person, place, and time.  Skin: Skin is warm and dry.  Psychiatric: He has a normal mood and affect. His behavior is normal. Judgment and thought content normal.    ED Course  Procedures (including critical care time) Results for orders placed during the hospital encounter of 09/04/13  URINALYSIS, ROUTINE W REFLEX MICROSCOPIC      Result Value Ref Range   Color, Urine YELLOW  YELLOW   APPearance TURBID (*) CLEAR   Specific Gravity, Urine 1.022  1.005 - 1.030   pH 6.5  5.0 - 8.0   Glucose, UA NEGATIVE  NEGATIVE mg/dL   Hgb urine dipstick LARGE (*) NEGATIVE   Bilirubin Urine NEGATIVE  NEGATIVE   Ketones, ur NEGATIVE  NEGATIVE mg/dL   Protein, ur >300 (*) NEGATIVE mg/dL   Urobilinogen, UA 1.0  0.0 - 1.0 mg/dL   Nitrite POSITIVE (*) NEGATIVE   Leukocytes, UA LARGE (*) NEGATIVE  URINALYSIS, ROUTINE W REFLEX MICROSCOPIC      Result Value Ref Range   Color, Urine YELLOW  YELLOW    APPearance TURBID (*) CLEAR   Specific Gravity, Urine 1.028  1.005 - 1.030   pH 5.5  5.0 - 8.0   Glucose, UA NEGATIVE  NEGATIVE mg/dL   Hgb urine dipstick LARGE (*) NEGATIVE   Bilirubin Urine NEGATIVE  NEGATIVE   Ketones, ur NEGATIVE  NEGATIVE mg/dL   Protein, ur 30 (*) NEGATIVE mg/dL   Urobilinogen, UA 1.0  0.0 - 1.0 mg/dL   Nitrite POSITIVE (*) NEGATIVE   Leukocytes, UA MODERATE (*) NEGATIVE  CBC WITH DIFFERENTIAL      Result Value Ref Range   WBC 7.1  4.0 - 10.5 K/uL   RBC 4.36  4.22 - 5.81 MIL/uL   Hemoglobin 12.9 (*) 13.0 - 17.0 g/dL   HCT 39.0  39.0 - 52.0 %   MCV 89.4  78.0 - 100.0 fL   MCH 29.6  26.0 -  34.0 pg   MCHC 33.1  30.0 - 36.0 g/dL   RDW 13.6  11.5 - 15.5 %   Platelets 295  150 - 400 K/uL   Neutrophils Relative % 62  43 - 77 %   Neutro Abs 4.4  1.7 - 7.7 K/uL   Lymphocytes Relative 27  12 - 46 %   Lymphs Abs 2.0  0.7 - 4.0 K/uL   Monocytes Relative 7  3 - 12 %   Monocytes Absolute 0.5  0.1 - 1.0 K/uL   Eosinophils Relative 4  0 - 5 %   Eosinophils Absolute 0.3  0.0 - 0.7 K/uL   Basophils Relative 0  0 - 1 %   Basophils Absolute 0.0  0.0 - 0.1 K/uL  BASIC METABOLIC PANEL      Result Value Ref Range   Sodium 143  137 - 147 mEq/L   Potassium 4.3  3.7 - 5.3 mEq/L   Chloride 109  96 - 112 mEq/L   CO2 20  19 - 32 mEq/L   Glucose, Bld 118 (*) 70 - 99 mg/dL   BUN 14  6 - 23 mg/dL   Creatinine, Ser 1.09  0.50 - 1.35 mg/dL   Calcium 9.2  8.4 - 10.5 mg/dL   GFR calc non Af Amer 72 (*) >90 mL/min   GFR calc Af Amer 83 (*) >90 mL/min  URINE MICROSCOPIC-ADD ON      Result Value Ref Range   Squamous Epithelial / LPF RARE  RARE   WBC, UA TOO NUMEROUS TO COUNT  <3 WBC/hpf   RBC / HPF TOO NUMEROUS TO COUNT  <3 RBC/hpf   Bacteria, UA MANY (*) RARE  URINE MICROSCOPIC-ADD ON      Result Value Ref Range   Squamous Epithelial / LPF RARE  RARE   WBC, UA 7-10  <3 WBC/hpf   RBC / HPF TOO NUMEROUS TO COUNT  <3 RBC/hpf   Bacteria, UA MANY (*) RARE   Urine-Other LESS THAN  10 mL OF URINE SUBMITTED       Imaging Review No results found.   EKG Interpretation None      MDM   Final diagnoses:  UTI (lower urinary tract infection)  Dysuria    Patient with UTI.  Chronic indwelling foley catheter.  Prior urine culture show sensitive to keflex.  Foley changed in the ED.  Will discharge with keflex and some pain meds.  Recommend urology follow-up.  Patient discussed with Dr. Dina Rich, who agrees with the plan.   Montine Circle, PA-C 09/04/13 1519

## 2013-09-04 NOTE — Discharge Instructions (Signed)
Urinary Tract Infection  Urinary tract infections (UTIs) can develop anywhere along your urinary tract. Your urinary tract is your body's drainage system for removing wastes and extra water. Your urinary tract includes two kidneys, two ureters, a bladder, and a urethra. Your kidneys are a pair of bean-shaped organs. Each kidney is about the size of your fist. They are located below your ribs, one on each side of your spine.  CAUSES  Infections are caused by microbes, which are microscopic organisms, including fungi, viruses, and bacteria. These organisms are so small that they can only be seen through a microscope. Bacteria are the microbes that most commonly cause UTIs.  SYMPTOMS   Symptoms of UTIs may vary by age and gender of the patient and by the location of the infection. Symptoms in young women typically include a frequent and intense urge to urinate and a painful, burning feeling in the bladder or urethra during urination. Older women and men are more likely to be tired, shaky, and weak and have muscle aches and abdominal pain. A fever may mean the infection is in your kidneys. Other symptoms of a kidney infection include pain in your back or sides below the ribs, nausea, and vomiting.  DIAGNOSIS  To diagnose a UTI, your caregiver will ask you about your symptoms. Your caregiver also will ask to provide a urine sample. The urine sample will be tested for bacteria and white blood cells. White blood cells are made by your body to help fight infection.  TREATMENT   Typically, UTIs can be treated with medication. Because most UTIs are caused by a bacterial infection, they usually can be treated with the use of antibiotics. The choice of antibiotic and length of treatment depend on your symptoms and the type of bacteria causing your infection.  HOME CARE INSTRUCTIONS   If you were prescribed antibiotics, take them exactly as your caregiver instructs you. Finish the medication even if you feel better after you  have only taken some of the medication.   Drink enough water and fluids to keep your urine clear or pale yellow.   Avoid caffeine, tea, and carbonated beverages. They tend to irritate your bladder.   Empty your bladder often. Avoid holding urine for long periods of time.   Empty your bladder before and after sexual intercourse.   After a bowel movement, women should cleanse from front to back. Use each tissue only once.  SEEK MEDICAL CARE IF:    You have back pain.   You develop a fever.   Your symptoms do not begin to resolve within 3 days.  SEEK IMMEDIATE MEDICAL CARE IF:    You have severe back pain or lower abdominal pain.   You develop chills.   You have nausea or vomiting.   You have continued burning or discomfort with urination.  MAKE SURE YOU:    Understand these instructions.   Will watch your condition.   Will get help right away if you are not doing well or get worse.  Document Released: 01/10/2005 Document Revised: 10/02/2011 Document Reviewed: 05/11/2011  [employer] Patient Information 2014 [employer].

## 2013-09-07 LAB — URINE CULTURE

## 2013-09-08 ENCOUNTER — Telehealth (HOSPITAL_BASED_OUTPATIENT_CLINIC_OR_DEPARTMENT_OTHER): Payer: Self-pay | Admitting: Emergency Medicine

## 2013-09-08 NOTE — ED Provider Notes (Signed)
Medical screening examination/treatment/procedure(s) were performed by non-physician practitioner and as supervising physician I was immediately available for consultation/collaboration.   EKG Interpretation None        Merryl Hacker, MD 09/08/13 [phone]

## 2013-09-08 NOTE — Telephone Encounter (Signed)
Post ED Visit - Positive Culture Follow-up  Culture report reviewed by antimicrobial stewardship pharmacist: []  Wes [place], Pharm.D., BCPS []  Heide Guile, Pharm.D., BCPS []  Alycia Rossetti, Pharm.D., BCPS []  [place], Pharm.D., BCPS, AAHIVP []  Legrand Como, Pharm.D., BCPS, AAHIVP []  Juliene Pina, Pharm.D. [x]  Assunta Curtis, Pharm.D.  Positive urine culture Treated with Keflex, organism sensitive to the same and no further patient follow-up is required at this time.  Cole Conrad 09/08/2013, 1:13 PM

## 2013-09-20 IMAGING — CR DG ABDOMEN ACUTE W/ 1V CHEST
3 series · 3 of 3 positions shown · non-contrast
Comparison: 06/26/2011

CLINICAL DATA: Fever and abdominal pain.  Prostate cancer

ACUTE ABDOMEN SERIES (ABDOMEN 2 VIEW & CHEST 1 VIEW)

[w chest pa]
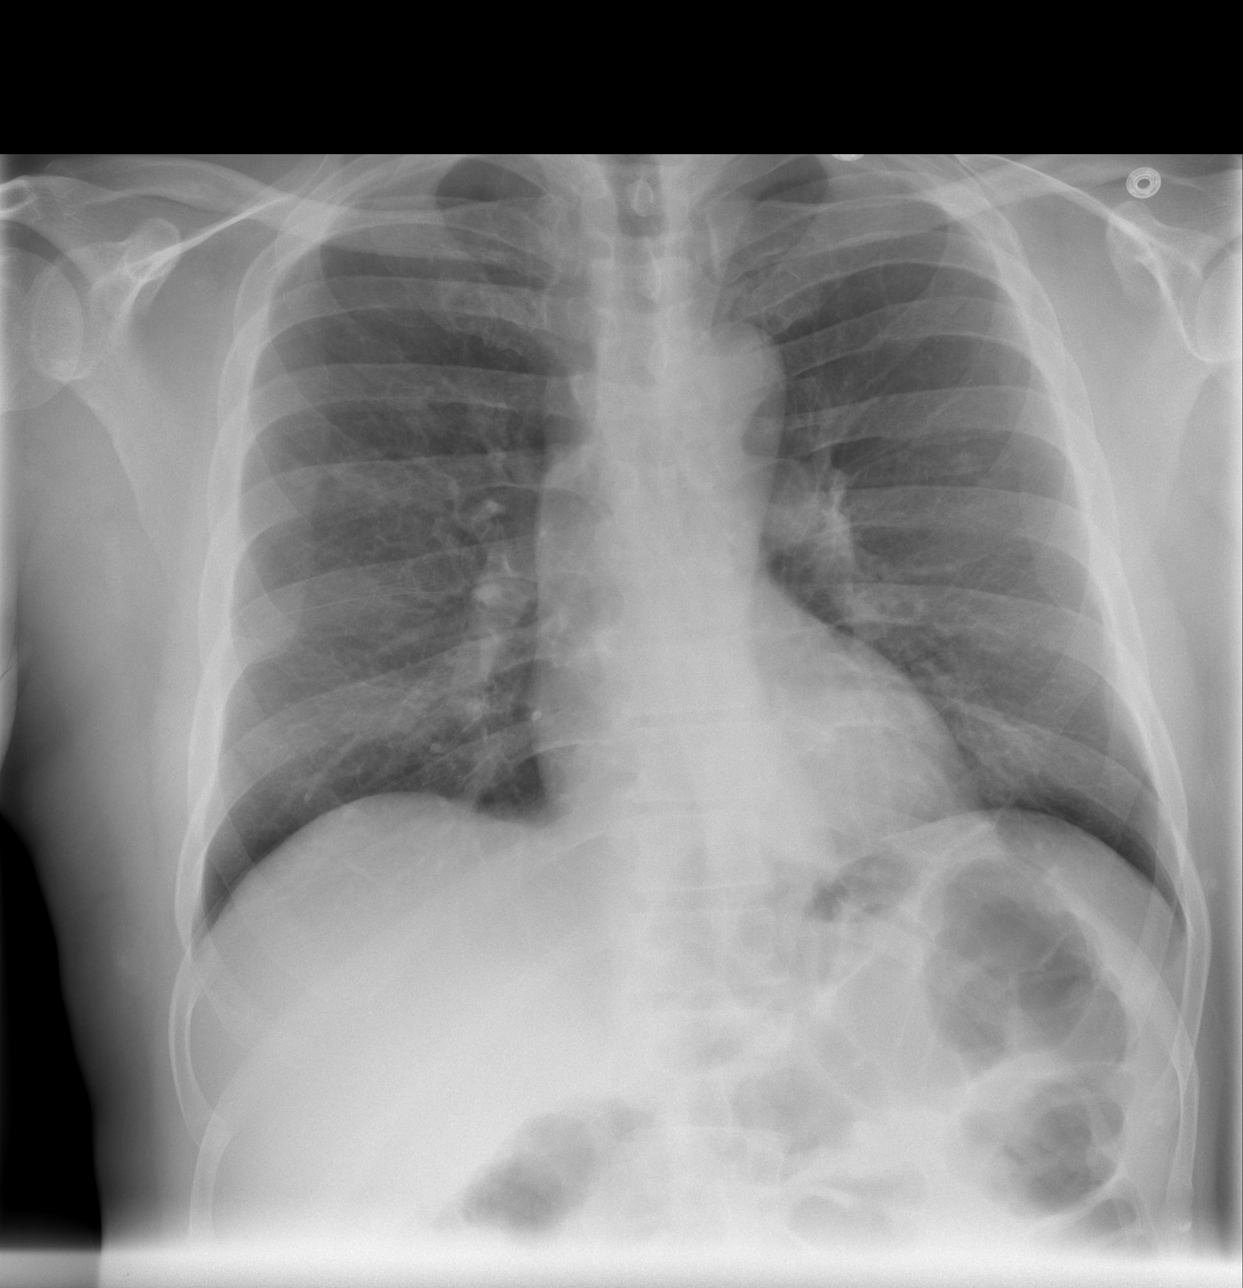

[w abdomen upright *]
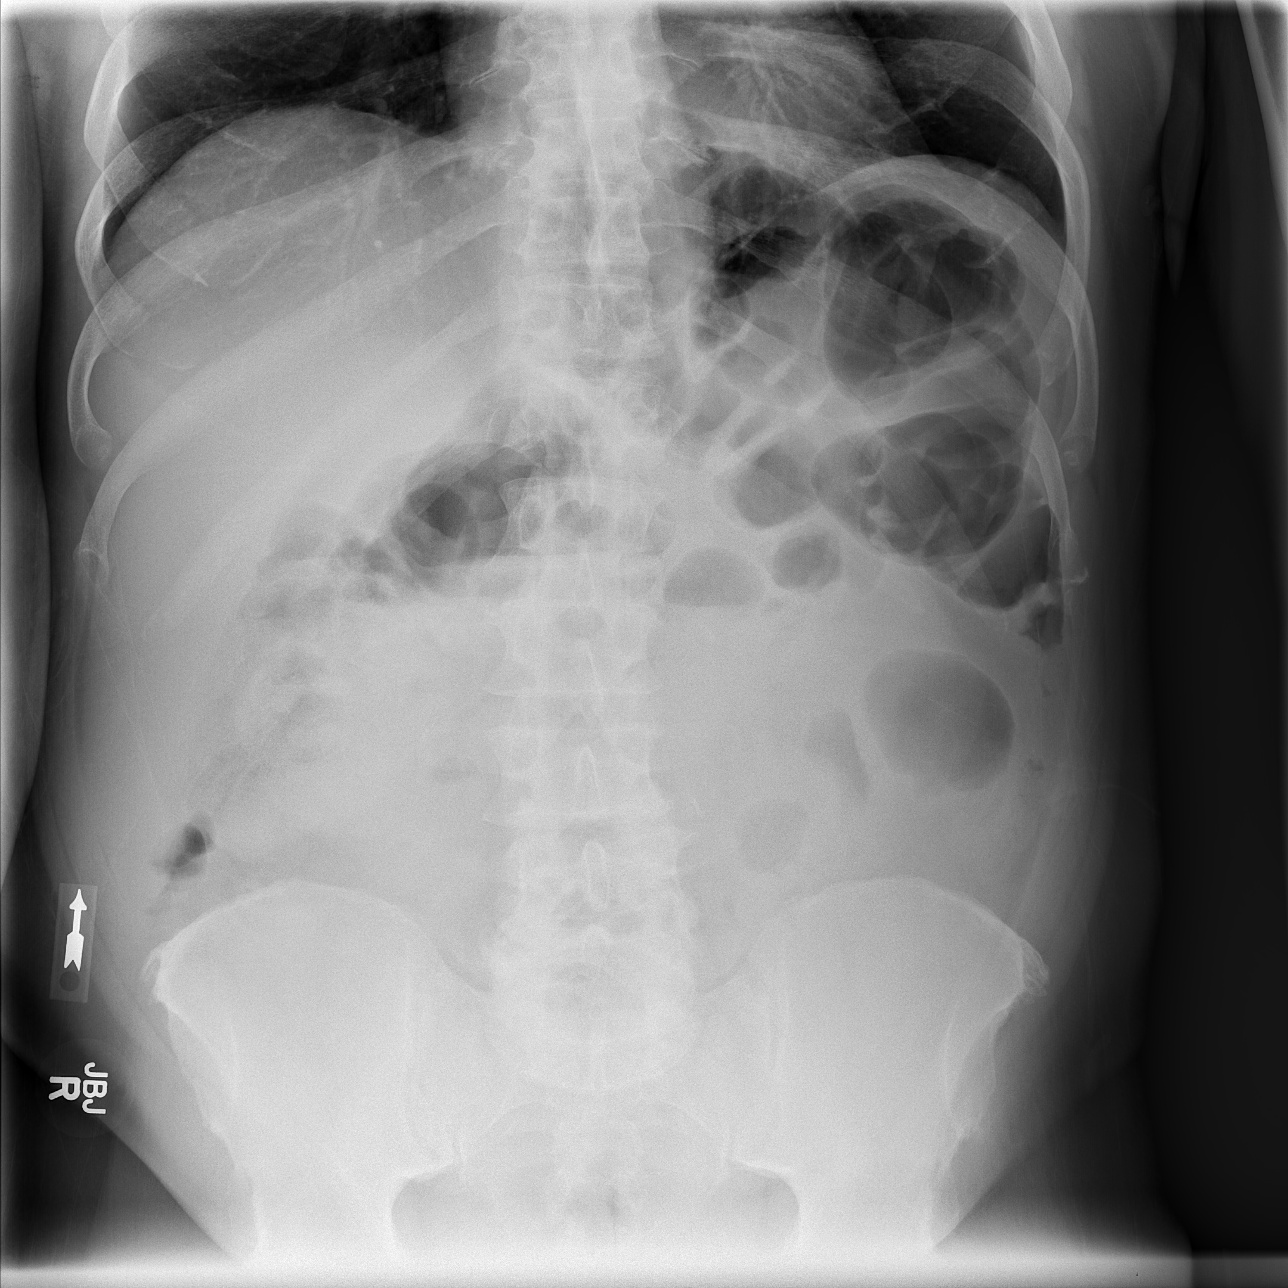

[t abdomen supine]
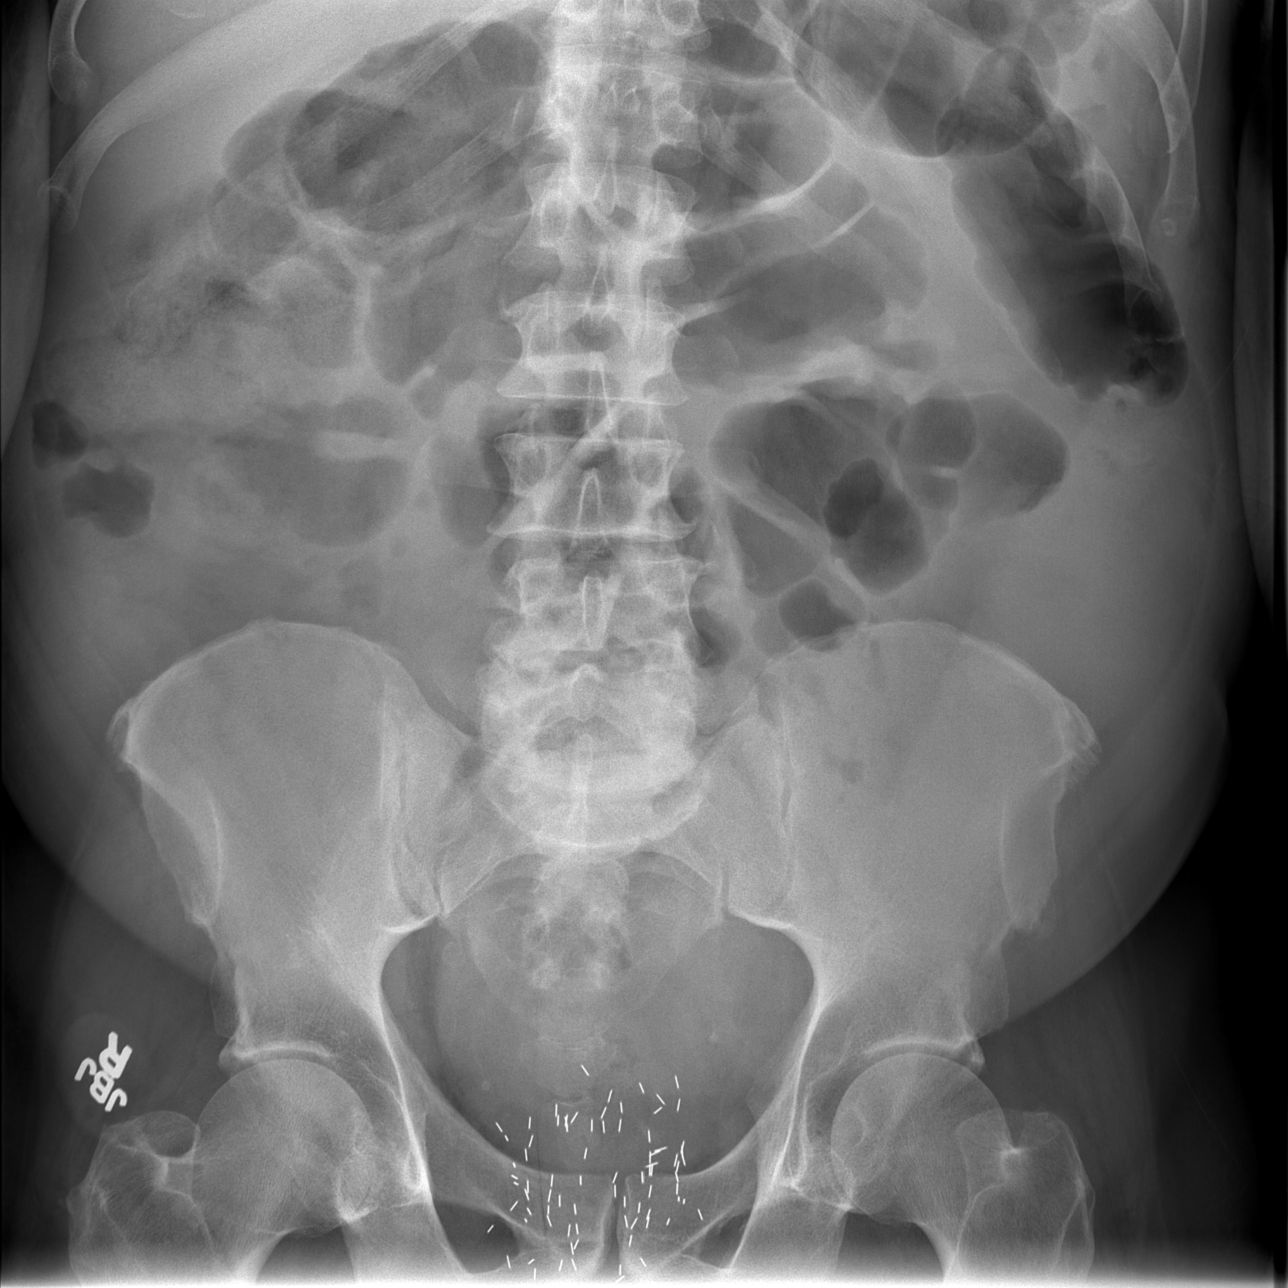

[3 of 3 positions shown; findings below may reference images not displayed]

FINDINGS: Heart size and vascularity are normal.  Lungs are clear.

Gas is present and mildly distended colon.  Small bowel is not
dilated.  Air-fluid levels are present in the right and transverse
colon.  Negative for free air.

Seeds are present in the prostate gland.  No acute bony changes.
IMPRESSION: No active cardiopulmonary disease.

Mild colonic distention with air-fluid levels may represent
gastroenteritis or ileus.

## 2013-10-29 ENCOUNTER — Emergency Department (HOSPITAL_COMMUNITY)
Admission: EM | Admit: 2013-10-29 | Discharge: 2013-10-29 | Disposition: A | Discharge status: Home / Self Care | Payer: Medicare Other | Attending: Emergency Medicine | Admitting: Emergency Medicine

## 2013-10-29 DIAGNOSIS — Z8546 Personal history of malignant neoplasm of prostate: Secondary | ICD-10-CM | POA: Insufficient documentation

## 2013-10-29 DIAGNOSIS — M129 Arthropathy, unspecified: Secondary | ICD-10-CM | POA: Diagnosis not present

## 2013-10-29 DIAGNOSIS — J45909 Unspecified asthma, uncomplicated: Secondary | ICD-10-CM | POA: Diagnosis not present

## 2013-10-29 DIAGNOSIS — R339 Retention of urine, unspecified: Secondary | ICD-10-CM | POA: Diagnosis not present

## 2013-10-29 DIAGNOSIS — Y846 Urinary catheterization as the cause of abnormal reaction of the patient, or of later complication, without mention of misadventure at the time of the procedure: Secondary | ICD-10-CM | POA: Diagnosis not present

## 2013-10-29 DIAGNOSIS — Z79899 Other long term (current) drug therapy: Secondary | ICD-10-CM | POA: Diagnosis not present

## 2013-10-29 DIAGNOSIS — E119 Type 2 diabetes mellitus without complications: Secondary | ICD-10-CM | POA: Diagnosis not present

## 2013-10-29 DIAGNOSIS — N529 Male erectile dysfunction, unspecified: Secondary | ICD-10-CM | POA: Diagnosis not present

## 2013-10-29 DIAGNOSIS — Z7982 Long term (current) use of aspirin: Secondary | ICD-10-CM | POA: Insufficient documentation

## 2013-10-29 DIAGNOSIS — G8929 Other chronic pain: Secondary | ICD-10-CM | POA: Diagnosis not present

## 2013-10-29 DIAGNOSIS — Z87891 Personal history of nicotine dependence: Secondary | ICD-10-CM | POA: Insufficient documentation

## 2013-10-29 DIAGNOSIS — Z792 Long term (current) use of antibiotics: Secondary | ICD-10-CM | POA: Insufficient documentation

## 2013-10-29 DIAGNOSIS — T83091A Other mechanical complication of indwelling urethral catheter, initial encounter: Secondary | ICD-10-CM | POA: Insufficient documentation

## 2013-10-29 DIAGNOSIS — Z8719 Personal history of other diseases of the digestive system: Secondary | ICD-10-CM | POA: Diagnosis not present

## 2013-10-29 DIAGNOSIS — IMO0002 Reserved for concepts with insufficient information to code with codable children: Secondary | ICD-10-CM | POA: Insufficient documentation

## 2013-10-29 DIAGNOSIS — T839XXD Unspecified complication of genitourinary prosthetic device, implant and graft, subsequent encounter: Secondary | ICD-10-CM

## 2013-10-29 LAB — URINE MICROSCOPIC-ADD ON

## 2013-10-29 LAB — URINALYSIS, ROUTINE W REFLEX MICROSCOPIC
Bilirubin Urine: NEGATIVE
Glucose, UA: 500 mg/dL — AB
Ketones, ur: NEGATIVE mg/dL
Nitrite: POSITIVE — AB
Protein, ur: 100 mg/dL — AB
Specific Gravity, Urine: 1.026 (ref 1.005–1.030)
Urobilinogen, UA: 1 mg/dL (ref 0.0–1.0)
pH: 6 (ref 5.0–8.0)

## 2013-10-29 NOTE — ED Provider Notes (Signed)
CSN: [id number]     Arrival date & time 10/29/13  26 History   First MD Initiated Contact with Patient 10/29/13 1350     Chief Complaint  Patient presents with  . Foley Catheter Clogged      (Consider location/radiation/quality/duration/timing/severity/associated sxs/prior Treatment) HPI  A 63 year old male with sleep the around his Foley catheter. Patient has had to have a Foley in place intermittently over the past 2 years. Reports a past history of prostate cancer and urinary retention. Reports that he has not had any urine in the leg bag since about Friday and has instead been leaking urine around penis. Denies pain. Has not followed up with urology recently because of financial constraints.   Past Medical History  Diagnosis Date  . ED (erectile dysfunction)   . Mild asthma SEASONAL -  PT HAS NO INHALER  . Diabetes mellitus ORAL MEDS    TYPE II  . Prostate cancer 05/01/2011 BX    ADENOCARCINOMA,gLEASON= 3+3=6,VOLUNE=68.8CC ,PSA=7.40  . Arthritis LUMBAR BACK, SHOULDER, LEFT KNEE, RIGHT WRIST  . DJD (degenerative joint disease)   . Chronic joint pain   . GERD (gastroesophageal reflux disease)   . Seasonal allergies   . 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  1997 (15 YRS AGO)    [place]  . History of head injury 2010-  CLOSED  W/ CONCUSSION--   NO RESIDUAL  . Eczema    Past Surgical History  Procedure Laterality Date  . Rotator cuff repair  01-10-2004    RIGHT  . Radioactive seed implant  09/07/2011    Procedure: RADIOACTIVE SEED IMPLANT;  Surgeon: Claybon Jabs, MD;  Location: [facility];  Service: Urology;  Laterality: N/A;  84  seeds implanted  . Cystoscopy  09/07/2011    Procedure: CYSTOSCOPY;  Surgeon: Claybon Jabs, MD;  Location: [facility];  Service: Urology;  Laterality: N/A;  no seeds noted in bladder   Family History  Problem Relation Age of Onset  . Prostate  cancer Maternal Uncle    History  Substance Use Topics  . Smoking status: Former Smoker    Types: Cigarettes    Quit date: 09/02/1973  . Smokeless tobacco: Never Used     Comment: QUIT SMOKING 1970'S  . Alcohol Use: No    Review of Systems  All systems reviewed and negative, other than as noted in HPI.   Allergies  Review of patient's allergies indicates no known allergies.  Home Medications   Prior to Admission medications   Medication Sig Start Date End Date Taking? Authorizing Provider  aspirin 81 MG chewable tablet Chew 81 mg by mouth daily.    Historical Provider, MD  cephALEXin (KEFLEX) 500 MG capsule Take 1 capsule (500 mg total) by mouth 4 (four) times daily. 09/04/13   Montine Circle, PA-C  gabapentin (NEURONTIN) 300 MG capsule Take 300 mg by mouth 2 (two) times daily.    Historical Provider, MD  glipiZIDE (GLUCOTROL XL) 10 MG 24 hr tablet Take 10 mg by mouth daily.    Historical Provider, MD  HYDROcodone-acetaminophen (NORCO/VICODIN) 5-325 MG per tablet Take 1-2 tablets by mouth every 6 (six) hours as needed. 09/04/13   Montine Circle, PA-C  metFORMIN (GLUCOPHAGE) 1000 MG tablet Take 1,000 mg by mouth 2 (two) times daily with a meal.    Historical Provider, MD  Multiple Vitamin (MULTIVITAMIN WITH MINERALS) TABS Take 1 tablet by mouth daily.  Historical Provider, MD  tamsulosin (FLOMAX) 0.4 MG CAPS capsule Take 0.4 mg by mouth daily after supper.    Historical Provider, MD   BP 142/85  Pulse 77  Temp(Src) 98.7 F (37.1 C) (Oral)  Resp 20  SpO2 100% Physical Exam  Nursing note and vitals reviewed. Constitutional: He appears well-developed and well-nourished. No distress.  HENT:  Head: Normocephalic and atraumatic.  Eyes: Conjunctivae are normal. Right eye exhibits no discharge. Left eye exhibits no discharge.  Neck: Neck supple.  Cardiovascular: Normal rate, regular rhythm and normal heart sounds.  Exam reveals no gallop and no friction rub.   No murmur  heard. Pulmonary/Chest: Effort normal and breath sounds normal. No respiratory distress.  Abdominal: Soft. He exhibits no distension. There is no tenderness.  Genitourinary:  No CVA tenderness. Foley to leg bag. No significant urine noted in the bag.  Musculoskeletal: He exhibits no edema and no tenderness.  Neurological: He is alert.  Skin: Skin is warm and dry.  Psychiatric: He has a normal mood and affect. His behavior is normal. Thought content normal.    ED Course  Procedures (including critical care time) Labs Review Labs Reviewed  URINALYSIS, ROUTINE W REFLEX MICROSCOPIC    Imaging Review No results found.   EKG Interpretation None      MDM   Final diagnoses:  Urinary retention  Foley catheter problem, subsequent encounter    63 year old male with history of urinary retention with leakage of fluid around penis and not through the catheter. Patient is due to have his catheter changed anyways. Will replace. Needs to follow-up with urology.     Virgel Manifold, MD 11/04/13 1440

## 2013-10-29 NOTE — ED Notes (Addendum)
Pt has foley but it has been clogged since Friday.  States he has had no output but he is urinating around the catheter.  States that his 61 y/o daughter just got diagnosed with thyroid cancer so he has been busy and not having time to get checked out.

## 2014-01-25 ENCOUNTER — Emergency Department (HOSPITAL_COMMUNITY)
Admission: EM | Admit: 2014-01-25 | Discharge: 2014-01-25 | Disposition: A | Discharge status: Home / Self Care | Payer: Medicare HMO | Attending: Emergency Medicine | Admitting: Emergency Medicine

## 2014-01-25 ENCOUNTER — Encounter (HOSPITAL_COMMUNITY): Payer: Self-pay | Admitting: Emergency Medicine

## 2014-01-25 DIAGNOSIS — Z872 Personal history of diseases of the skin and subcutaneous tissue: Secondary | ICD-10-CM | POA: Insufficient documentation

## 2014-01-25 DIAGNOSIS — R339 Retention of urine, unspecified: Secondary | ICD-10-CM

## 2014-01-25 DIAGNOSIS — E119 Type 2 diabetes mellitus without complications: Secondary | ICD-10-CM | POA: Diagnosis not present

## 2014-01-25 DIAGNOSIS — Z87891 Personal history of nicotine dependence: Secondary | ICD-10-CM | POA: Diagnosis not present

## 2014-01-25 DIAGNOSIS — G8929 Other chronic pain: Secondary | ICD-10-CM | POA: Diagnosis not present

## 2014-01-25 DIAGNOSIS — Z87828 Personal history of other (healed) physical injury and trauma: Secondary | ICD-10-CM | POA: Insufficient documentation

## 2014-01-25 DIAGNOSIS — J45909 Unspecified asthma, uncomplicated: Secondary | ICD-10-CM | POA: Insufficient documentation

## 2014-01-25 DIAGNOSIS — Z79899 Other long term (current) drug therapy: Secondary | ICD-10-CM | POA: Insufficient documentation

## 2014-01-25 DIAGNOSIS — Z8719 Personal history of other diseases of the digestive system: Secondary | ICD-10-CM | POA: Diagnosis not present

## 2014-01-25 DIAGNOSIS — Z8546 Personal history of malignant neoplasm of prostate: Secondary | ICD-10-CM | POA: Insufficient documentation

## 2014-01-25 DIAGNOSIS — Z7982 Long term (current) use of aspirin: Secondary | ICD-10-CM | POA: Diagnosis not present

## 2014-01-25 DIAGNOSIS — M1990 Unspecified osteoarthritis, unspecified site: Secondary | ICD-10-CM | POA: Insufficient documentation

## 2014-01-25 DIAGNOSIS — L02411 Cutaneous abscess of right axilla: Secondary | ICD-10-CM

## 2014-01-25 DIAGNOSIS — Z466 Encounter for fitting and adjustment of urinary device: Secondary | ICD-10-CM | POA: Diagnosis present

## 2014-01-25 LAB — URINALYSIS, ROUTINE W REFLEX MICROSCOPIC
Bilirubin Urine: NEGATIVE
GLUCOSE, UA: NEGATIVE mg/dL
Ketones, ur: NEGATIVE mg/dL
Nitrite: POSITIVE — AB
PH: 5.5 (ref 5.0–8.0)
Protein, ur: 100 mg/dL — AB
Specific Gravity, Urine: 1.022 (ref 1.005–1.030)
UROBILINOGEN UA: 1 mg/dL (ref 0.0–1.0)

## 2014-01-25 LAB — URINE MICROSCOPIC-ADD ON

## 2014-01-25 MED ORDER — SULFAMETHOXAZOLE-TRIMETHOPRIM 800-160 MG PO TABS: 1.0000 | ORAL_TABLET | Freq: Two times a day (BID) | ORAL | Status: DC

## 2014-01-25 NOTE — ED Notes (Addendum)
Pt has had foley in for about 1.5 months, pt c/o discharge and blood around penis and blood in urine. Pt also states he has knots under right axilla.

## 2014-01-25 NOTE — ED Provider Notes (Signed)
CSN: [id number]     Arrival date & time 01/25/14  1247 History   First MD Initiated Contact with Patient 01/25/14 1458     Chief Complaint  Patient presents with  . foley cath problems      (Consider location/radiation/quality/duration/timing/severity/associated sxs/prior Treatment) HPI Comments: Patient presents to the ER for evaluation of multiple problems. He reports that he has a Foley catheter in place secondary to urinary retention. A catheter has been in place for approximately 1/2 months. In the last couple of days he has had rotation of the head of his penis and bloody discharge coming around the edge of the Foley. He has not had any fever, chills, nausea, vomiting, back pain.  Patient also complaining of knot under his right arm. Patient reports that he has at least 3 small slightly tender knots in his right armpit did not have any associated drainage.   Past Medical History  Diagnosis Date  . ED (erectile dysfunction)   . Mild asthma SEASONAL -  PT HAS NO INHALER  . Diabetes mellitus ORAL MEDS    TYPE II  . Prostate cancer 05/01/2011 BX    ADENOCARCINOMA,gLEASON= 3+3=6,VOLUNE=68.8CC ,PSA=7.40  . Arthritis LUMBAR BACK, SHOULDER, LEFT KNEE, RIGHT WRIST  . DJD (degenerative joint disease)   . Chronic joint pain   . GERD (gastroesophageal reflux disease)   . Seasonal allergies   . 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  1997 (15 YRS AGO)    [place]  . History of head injury 2010-  CLOSED  W/ CONCUSSION--   NO RESIDUAL  . Eczema    Past Surgical History  Procedure Laterality Date  . Rotator cuff repair  01-10-2004    RIGHT  . Radioactive seed implant  09/07/2011    Procedure: RADIOACTIVE SEED IMPLANT;  Surgeon: Claybon Jabs, MD;  Location: [facility];  Service: Urology;  Laterality: N/A;  84  seeds implanted  . Cystoscopy  09/07/2011    Procedure: CYSTOSCOPY;  Surgeon: Claybon Jabs, MD;   Location: [facility];  Service: Urology;  Laterality: N/A;  no seeds noted in bladder   Family History  Problem Relation Age of Onset  . Prostate cancer Maternal Uncle    History  Substance Use Topics  . Smoking status: Former Smoker    Types: Cigarettes    Quit date: 09/02/1973  . Smokeless tobacco: Never Used     Comment: QUIT SMOKING 1970'S  . Alcohol Use: No    Review of Systems  Genitourinary:       Irritation of penis assoc with foley  Skin: Positive for wound.  All other systems reviewed and are negative.     Allergies  Review of patient's allergies indicates no known allergies.  Home Medications   Prior to Admission medications   Medication Sig Start Date End Date Taking? Authorizing Provider  aspirin 81 MG chewable tablet Chew 81 mg by mouth daily.   Yes Historical Provider, MD  gabapentin (NEURONTIN) 300 MG capsule Take 300 mg by mouth 2 (two) times daily.   Yes Historical Provider, MD  glipiZIDE (GLUCOTROL XL) 10 MG 24 hr tablet Take 10 mg by mouth daily.   Yes Historical Provider, MD  metFORMIN (GLUCOPHAGE) 1000 MG tablet Take 1,000 mg by mouth 2 (two) times daily with a meal.   Yes Historical Provider, MD  Multiple Vitamin (MULTIVITAMIN WITH MINERALS) TABS Take 1 tablet by mouth  daily.   Yes Historical Provider, MD  tamsulosin (FLOMAX) 0.4 MG CAPS capsule Take 0.4 mg by mouth daily after supper.   Yes Historical Provider, MD   BP 133/78  Pulse 72  Temp(Src) 98.9 F (37.2 C) (Oral)  Resp 20  SpO2 99% Physical Exam  Constitutional: He is oriented to person, place, and time. He appears well-developed and well-nourished. No distress.  HENT:  Head: Normocephalic and atraumatic.  Right Ear: Hearing normal.  Left Ear: Hearing normal.  Nose: Nose normal.  Mouth/Throat: Oropharynx is clear and moist and mucous membranes are normal.  Eyes: Conjunctivae and EOM are normal. Pupils are equal, round, and reactive to light.  Neck: Normal range of  motion. Neck supple.  Cardiovascular: Regular rhythm, S1 normal and S2 normal.  Exam reveals no gallop and no friction rub.   No murmur heard. Pulmonary/Chest: Effort normal and breath sounds normal. No respiratory distress. He exhibits no tenderness.  Abdominal: Soft. Normal appearance and bowel sounds are normal. There is no hepatosplenomegaly. There is no tenderness. There is no rebound, no guarding, no tenderness at McBurney's point and negative Murphy's sign. No hernia.  Musculoskeletal: Normal range of motion.  Neurological: He is alert and oriented to person, place, and time. He has normal strength. No cranial nerve deficit or sensory deficit. Coordination normal. GCS eye subscore is 4. GCS verbal subscore is 5. GCS motor subscore is 6.  Skin: Skin is warm, dry and intact. No rash noted. No cyanosis.  Three less than 0.5cm slightly tender, non-fluctuant nodules right axilla. No overlying erythema or induration.  Psychiatric: He has a normal mood and affect. His speech is normal and behavior is normal. Thought content normal.    ED Course  Procedures (including critical care time) Labs Review Labs Reviewed - No data to display  Imaging Review No results found.   EKG Interpretation None      MDM   Final diagnoses:  None  Skin abscess Urinary retention  Presents to the ER for evaluation of irritation secondary to chronic indwelling Foley catheter. Patient has followup with his urologist scheduled EKGs. Will exchange the Foley catheter.  Patient also complaining of tender lumps under his right arm. He does have 3 discrete slightly tender nodules in the axilla, possibly early abscess or cyst. Nothing fluctuant to drain.  Patient will be initiated on Bactrim for MRSA coverage for his skin findings as well as urine. Urine culture sent.    Orpah Greek, MD 01/25/14 [phone]

## 2014-01-25 NOTE — Discharge Instructions (Signed)
Abscess An abscess is an infected area that contains a collection of pus and debris.It can occur in almost any part of the body. An abscess is also known as a furuncle or boil. CAUSES  An abscess occurs when tissue gets infected. This can occur from blockage of oil or sweat glands, infection of hair follicles, or a minor injury to the skin. As the body tries to fight the infection, pus collects in the area and creates pressure under the skin. This pressure causes pain. People with weakened immune systems have difficulty fighting infections and get certain abscesses more often.  SYMPTOMS Usually an abscess develops on the skin and becomes a painful mass that is red, warm, and tender. If the abscess forms under the skin, you may feel a moveable soft area under the skin. Some abscesses break open (rupture) on their own, but most will continue to get worse without care. The infection can spread deeper into the body and eventually into the bloodstream, causing you to feel ill.  DIAGNOSIS  Your caregiver will take your medical history and perform a physical exam. A sample of fluid may also be taken from the abscess to determine what is causing your infection. TREATMENT  Your caregiver may prescribe antibiotic medicines to fight the infection. However, taking antibiotics alone usually does not cure an abscess. Your caregiver may need to make a small cut (incision) in the abscess to drain the pus. In some cases, gauze is packed into the abscess to reduce pain and to continue draining the area. HOME CARE INSTRUCTIONS   Only take over-the-counter or prescription medicines for pain, discomfort, or fever as directed by your caregiver.  If you were prescribed antibiotics, take them as directed. Finish them even if you start to feel better.  If gauze is used, follow your caregiver's directions for changing the gauze.  To avoid spreading the infection:  Keep your draining abscess covered with a  bandage.  Wash your hands well.  Do not share personal care items, towels, or whirlpools with others.  Avoid skin contact with others.  Keep your skin and clothes clean around the abscess.  Keep all follow-up appointments as directed by your caregiver. SEEK MEDICAL CARE IF:   You have increased pain, swelling, redness, fluid drainage, or bleeding.  You have muscle aches, chills, or a general ill feeling.  You have a fever. MAKE SURE YOU:   Understand these instructions.  Will watch your condition.  Will get help right away if you are not doing well or get worse. Document Released: 01/10/2005 Document Revised: 10/02/2011 Document Reviewed: 06/15/2011 [facility] Patient Information 2015 [place], [place]. This information is not intended to replace advice given to you by your health care provider. Make sure you discuss any questions you have with your health care provider.  Acute Urinary Retention Acute urinary retention is the temporary inability to urinate. This is a common problem in older men. As men age their prostates become larger and block the flow of urine from the bladder. This is usually a problem that has come on gradually.  HOME CARE INSTRUCTIONS If you are sent home with a Foley catheter and a drainage system, you will need to discuss the best course of action with your health care provider. While the catheter is in, maintain a good intake of fluids. Keep the drainage bag emptied and lower than your catheter. This is so that contaminated urine will not flow back into your bladder, which could lead to a urinary tract  infection. There are two main types of drainage bags. One is a large bag that usually is used at night. It has a good capacity that will allow you to sleep through the night without having to empty it. The second type is called a leg bag. It has a smaller capacity, so it needs to be emptied more frequently. However, the main advantage is that it can be attached by  a leg strap and can go underneath your clothing, allowing you the freedom to move about or leave your home. Only take over-the-counter or prescription medicines for pain, discomfort, or fever as directed by your health care provider.  SEEK MEDICAL CARE IF:  You develop a low-grade fever.  You experience spasms or leakage of urine with the spasms. SEEK IMMEDIATE MEDICAL CARE IF:   You develop chills or fever.  Your catheter stops draining urine.  Your catheter falls out.  You start to develop increased bleeding that does not respond to rest and increased fluid intake. MAKE SURE YOU:  Understand these instructions.  Will watch your condition.  Will get help right away if you are not doing well or get worse. Document Released: 07/09/2000 Document Revised: 04/07/2013 Document Reviewed: 09/11/2012 [facility] Patient Information 2015 [place], [place]. This information is not intended to replace advice given to you by your health care provider. Make sure you discuss any questions you have with your health care provider. Foley Catheter Care A Foley catheter is a soft, flexible tube that is placed into the bladder to drain urine. A Foley catheter may be inserted if:  You leak urine or are not able to control when you urinate (urinary incontinence).  You are not able to urinate when you need to (urinary retention).  You had prostate surgery or surgery on the genitals.  You have certain medical conditions, such as multiple sclerosis, dementia, or a spinal cord injury. If you are going home with a Foley catheter in place, follow the instructions below. TAKING CARE OF THE CATHETER 1. Wash your hands with soap and water. 2. Using mild soap and warm water on a clean washcloth:  Clean the area on your body closest to the catheter insertion site using a circular motion, moving away from the catheter. Never wipe toward the catheter because this could sweep bacteria up into the urethra and cause  infection.  Remove all traces of soap. Pat the area dry with a clean towel. For males, reposition the foreskin. 3. Attach the catheter to your leg so there is no tension on the catheter. Use adhesive tape or a leg strap. If you are using adhesive tape, remove any sticky residue left behind by the previous tape you used. 4. Keep the drainage bag below the level of the bladder, but keep it off the floor. 5. Check throughout the day to be sure the catheter is working and urine is draining freely. Make sure the tubing does not become kinked. 6. Do not pull on the catheter or try to remove it. Pulling could damage internal tissues. TAKING CARE OF THE DRAINAGE BAGS You will be given two drainage bags to take home. One is a large overnight drainage bag, and the other is a smaller leg bag that fits underneath clothing. You may wear the overnight bag at any time, but you should never wear the smaller leg bag at night. Follow the instructions below for how to empty, change, and clean your drainage bags. Emptying the Drainage Bag You must empty your drainage bag  when it is  - full or at least 2-3 times a day. 1. Wash your hands with soap and water. 2. Keep the drainage bag below your hips, below the level of your bladder. This stops urine from going back into the tubing and into your bladder. 3. Hold the dirty bag over the toilet or a clean container. 4. Open the pour spout at the bottom of the bag and empty the urine into the toilet or container. Do not let the pour spout touch the toilet, container, or any other surface. Doing so can place bacteria on the bag, which can cause an infection. 5. Clean the pour spout with a gauze pad or cotton ball that has rubbing alcohol on it. 6. Close the pour spout. 7. Attach the bag to your leg with adhesive tape or a leg strap. 8. Wash your hands well. Changing the Drainage Bag Change your drainage bag once a month or sooner if it starts to smell bad or look dirty.  Below are steps to follow when changing the drainage bag. 1. Wash your hands with soap and water. 2. Pinch off the rubber catheter so that urine does not spill out. 3. Disconnect the catheter tube from the drainage tube at the connection valve. Do not let the tubes touch any surface. 4. Clean the end of the catheter tube with an alcohol wipe. Use a different alcohol wipe to clean the end of the drainage tube. 5. Connect the catheter tube to the drainage tube of the clean drainage bag. 6. Attach the new bag to the leg with adhesive tape or a leg strap. Avoid attaching the new bag too tightly. 7. Wash your hands well. Cleaning the Drainage Bag 1. Wash your hands with soap and water. 2. Wash the bag in warm, soapy water. 3. Rinse the bag thoroughly with warm water. 4. Fill the bag with a solution of white vinegar and water (1 cup vinegar to 1 qt warm water [.2 L vinegar to 1 L warm water]). Close the bag and soak it for 30 minutes in the solution. 5. Rinse the bag with warm water. 6. Hang the bag to dry with the pour spout open and hanging downward. 7. Store the clean bag (once it is dry) in a clean plastic bag. 8. Wash your hands well. PREVENTING INFECTION  Wash your hands before and after handling your catheter.  Take showers daily and wash the area where the catheter enters your body. Do not take baths. Replace wet leg straps with dry ones, if this applies.  Do not use powders, sprays, or lotions on the genital area. Only use creams, lotions, or ointments as directed by your caregiver.  For females, wipe from front to back after each bowel movement.  Drink enough fluids to keep your urine clear or pale yellow unless you have a fluid restriction.  Do not let the drainage bag or tubing touch or lie on the floor.  Wear cotton underwear to absorb moisture and to keep your skin drier. SEEK MEDICAL CARE IF:   Your urine is cloudy or smells unusually bad.  Your catheter becomes  clogged.  You are not draining urine into the bag or your bladder feels full.  Your catheter starts to leak. SEEK IMMEDIATE MEDICAL CARE IF:   You have pain, swelling, redness, or pus where the catheter enters the body.  You have pain in the abdomen, legs, lower back, or bladder.  You have a fever.  You see blood  fill the catheter, or your urine is pink or red.  You have nausea, vomiting, or chills.  Your catheter gets pulled out. MAKE SURE YOU:   Understand these instructions.  Will watch your condition.  Will get help right away if you are not doing well or get worse. Document Released: 04/02/2005 Document Revised: 08/17/2013 Document Reviewed: 03/24/2012 [facility] Patient Information 2015 [place], [place]. This information is not intended to replace advice given to you by your health care provider. Make sure you discuss any questions you have with your health care provider.

## 2014-01-28 LAB — URINE CULTURE: Colony Count: >=100000

## 2014-01-29 ENCOUNTER — Telehealth (HOSPITAL_COMMUNITY): Payer: Self-pay

## 2014-01-29 ENCOUNTER — Other Ambulatory Visit: Payer: Self-pay

## 2014-01-29 NOTE — ED Notes (Signed)
Post ED Visit - Positive Culture Follow-up  Culture report reviewed by antimicrobial stewardship pharmacist: []  Wes Dulaney, Pharm.D., BCPS []  Heide Guile, Pharm.D., BCPS []  Alycia Rossetti, Pharm.D., BCPS []  [place], [place].D., BCPS, AAHIVP [x]  Legrand Como, Pharm.D., BCPS, AAHIVP []  Carly Sabat, Pharm.D. []  Elenor Quinones, Pharm.D.  Positive urine culture Treated with sulfamethoxazole-trimethoprim, organism sensitive to the same and no further patient follow-up is required at this time.  Ileene Musa 01/29/2014, 11:15 AM

## 2014-03-05 ENCOUNTER — Emergency Department (HOSPITAL_COMMUNITY)
Admission: EM | Admit: 2014-03-05 | Discharge: 2014-03-05 | Disposition: A | Discharge status: Home / Self Care | Payer: Medicare HMO | Attending: Emergency Medicine | Admitting: Emergency Medicine

## 2014-03-05 ENCOUNTER — Encounter (HOSPITAL_COMMUNITY): Payer: Self-pay | Admitting: Emergency Medicine

## 2014-03-05 DIAGNOSIS — Z8546 Personal history of malignant neoplasm of prostate: Secondary | ICD-10-CM | POA: Diagnosis not present

## 2014-03-05 DIAGNOSIS — Z87891 Personal history of nicotine dependence: Secondary | ICD-10-CM | POA: Insufficient documentation

## 2014-03-05 DIAGNOSIS — G8929 Other chronic pain: Secondary | ICD-10-CM | POA: Insufficient documentation

## 2014-03-05 DIAGNOSIS — Z792 Long term (current) use of antibiotics: Secondary | ICD-10-CM | POA: Insufficient documentation

## 2014-03-05 DIAGNOSIS — Z79899 Other long term (current) drug therapy: Secondary | ICD-10-CM | POA: Insufficient documentation

## 2014-03-05 DIAGNOSIS — J45909 Unspecified asthma, uncomplicated: Secondary | ICD-10-CM | POA: Diagnosis not present

## 2014-03-05 DIAGNOSIS — Z466 Encounter for fitting and adjustment of urinary device: Secondary | ICD-10-CM | POA: Insufficient documentation

## 2014-03-05 DIAGNOSIS — Z7982 Long term (current) use of aspirin: Secondary | ICD-10-CM | POA: Diagnosis not present

## 2014-03-05 DIAGNOSIS — R339 Retention of urine, unspecified: Secondary | ICD-10-CM | POA: Insufficient documentation

## 2014-03-05 DIAGNOSIS — Z8719 Personal history of other diseases of the digestive system: Secondary | ICD-10-CM | POA: Diagnosis not present

## 2014-03-05 DIAGNOSIS — M1990 Unspecified osteoarthritis, unspecified site: Secondary | ICD-10-CM | POA: Diagnosis not present

## 2014-03-05 DIAGNOSIS — Z87828 Personal history of other (healed) physical injury and trauma: Secondary | ICD-10-CM | POA: Insufficient documentation

## 2014-03-05 DIAGNOSIS — Z872 Personal history of diseases of the skin and subcutaneous tissue: Secondary | ICD-10-CM | POA: Diagnosis not present

## 2014-03-05 DIAGNOSIS — E119 Type 2 diabetes mellitus without complications: Secondary | ICD-10-CM | POA: Insufficient documentation

## 2014-03-05 LAB — URINALYSIS, ROUTINE W REFLEX MICROSCOPIC
Bilirubin Urine: NEGATIVE
GLUCOSE, UA: 500 mg/dL — AB
Ketones, ur: NEGATIVE mg/dL
Nitrite: POSITIVE — AB
Protein, ur: 100 mg/dL — AB
Specific Gravity, Urine: 1.024 (ref 1.005–1.030)
UROBILINOGEN UA: 1 mg/dL (ref 0.0–1.0)
pH: 6 (ref 5.0–8.0)

## 2014-03-05 LAB — URINE MICROSCOPIC-ADD ON

## 2014-03-05 NOTE — Discharge Instructions (Signed)
Foley Catheter Care °A Foley catheter is a soft, flexible tube that is placed into the bladder to drain urine. A Foley catheter may be inserted if: °· You leak urine or are not able to control when you urinate (urinary incontinence). °· You are not able to urinate when you need to (urinary retention). °· You had prostate surgery or surgery on the genitals. °· You have certain medical conditions, such as multiple sclerosis, dementia, or a spinal cord injury. °If you are going home with a Foley catheter in place, follow the instructions below. °TAKING CARE OF THE CATHETER °1. Wash your hands with soap and water. °2. Using mild soap and warm water on a clean washcloth: °· Clean the area on your body closest to the catheter insertion site using a circular motion, moving away from the catheter. Never wipe toward the catheter because this could sweep bacteria up into the urethra and cause infection. °· Remove all traces of soap. Pat the area dry with a clean towel. For males, reposition the foreskin. °3. Attach the catheter to your leg so there is no tension on the catheter. Use adhesive tape or a leg strap. If you are using adhesive tape, remove any sticky residue left behind by the previous tape you used. °4. Keep the drainage bag below the level of the bladder, but keep it off the floor. °5. Check throughout the day to be sure the catheter is working and urine is draining freely. Make sure the tubing does not become kinked. °6. Do not pull on the catheter or try to remove it. Pulling could damage internal tissues. °TAKING CARE OF THE DRAINAGE BAGS °You will be given two drainage bags to take home. One is a large overnight drainage bag, and the other is a smaller leg bag that fits underneath clothing. You may wear the overnight bag at any time, but you should never wear the smaller leg bag at night. Follow the instructions below for how to empty, change, and clean your drainage bags. °Emptying the Drainage Bag °You must  empty your drainage bag when it is  -½ full or at least 2-3 times a day. °1. Wash your hands with soap and water. °2. Keep the drainage bag below your hips, below the level of your bladder. This stops urine from going back into the tubing and into your bladder. °3. Hold the dirty bag over the toilet or a clean container. °4. Open the pour spout at the bottom of the bag and empty the urine into the toilet or container. Do not let the pour spout touch the toilet, container, or any other surface. Doing so can place bacteria on the bag, which can cause an infection. °5. Clean the pour spout with a gauze pad or cotton ball that has rubbing alcohol on it. °6. Close the pour spout. °7. Attach the bag to your leg with adhesive tape or a leg strap. °8. Wash your hands well. °Changing the Drainage Bag °Change your drainage bag once a month or sooner if it starts to smell bad or look dirty. Below are steps to follow when changing the drainage bag. °1. Wash your hands with soap and water. °2. Pinch off the rubber catheter so that urine does not spill out. °3. Disconnect the catheter tube from the drainage tube at the connection valve. Do not let the tubes touch any surface. °4. Clean the end of the catheter tube with an alcohol wipe. Use a different alcohol wipe to clean the   end of the drainage tube. °5. Connect the catheter tube to the drainage tube of the clean drainage bag. °6. Attach the new bag to the leg with adhesive tape or a leg strap. Avoid attaching the new bag too tightly. °7. Wash your hands well. °Cleaning the Drainage Bag °1. Wash your hands with soap and water. °2. Wash the bag in warm, soapy water. °3. Rinse the bag thoroughly with warm water. °4. Fill the bag with a solution of white vinegar and water (1 cup vinegar to 1 qt warm water [.2 L vinegar to 1 L warm water]). Close the bag and soak it for 30 minutes in the solution. °5. Rinse the bag with warm water. °6. Hang the bag to dry with the pour spout open  and hanging downward. °7. Store the clean bag (once it is dry) in a clean plastic bag. °8. Wash your hands well. °PREVENTING INFECTION °· Wash your hands before and after handling your catheter. °· Take showers daily and wash the area where the catheter enters your body. Do not take baths. Replace wet leg straps with dry ones, if this applies. °· Do not use powders, sprays, or lotions on the genital area. Only use creams, lotions, or ointments as directed by your caregiver. °· For females, wipe from front to back after each bowel movement. °· Drink enough fluids to keep your urine clear or pale yellow unless you have a fluid restriction. °· Do not let the drainage bag or tubing touch or lie on the floor. °· Wear cotton underwear to absorb moisture and to keep your skin drier. °SEEK MEDICAL CARE IF:  °· Your urine is cloudy or smells unusually bad. °· Your catheter becomes clogged. °· You are not draining urine into the bag or your bladder feels full. °· Your catheter starts to leak. °SEEK IMMEDIATE MEDICAL CARE IF:  °· You have pain, swelling, redness, or pus where the catheter enters the body. °· You have pain in the abdomen, legs, lower back, or bladder. °· You have a fever. °· You see blood fill the catheter, or your urine is pink or red. °· You have nausea, vomiting, or chills. °· Your catheter gets pulled out. °MAKE SURE YOU:  °· Understand these instructions. °· Will watch your condition. °· Will get help right away if you are not doing well or get worse. °Document Released: 04/02/2005 Document Revised: 08/17/2013 Document Reviewed: 03/24/2012 °[employer]® Patient Information ©2015 [employer]. This information is not intended to replace advice given to you by your health care provider. Make sure you discuss any questions you have with your health care provider. ° °

## 2014-03-05 NOTE — ED Notes (Addendum)
Pt has urinary catheter, since last night pt has had urine leaking around catheter and through catheter. Pt also reports strong urine smell. Catheter placed 10/21. Pt has been flushing catheter with vinegar solution.

## 2014-03-05 NOTE — ED Provider Notes (Signed)
CSN: [id number]     Arrival date & time 03/05/14  1326 History   First MD Initiated Contact with Patient 03/05/14 1340     Chief Complaint  Patient presents with  . Urinary Retention    urinary cather problem     HPI  Cole Conrad presents for Foley catheter change. He has had an indwelling Foley catheter for urinary retention since October 21. He was supposed to follow-up with the urology office but was unable to afford his appointment.  He reports that he is beginning to have pain around the catheter insertion site with leakage of fluids around the catheter. He is still draining through the catheter but he noticed that the odor is stronger than usual.  He denies fevers, abdominal pain, vomiting. Symptoms are mild and constant.   Past Medical History  Diagnosis Date  . ED (erectile dysfunction)   . Mild asthma SEASONAL -  PT HAS NO INHALER  . Diabetes mellitus ORAL MEDS    TYPE II  . Prostate cancer 05/01/2011 BX    ADENOCARCINOMA,gLEASON= 3+3=6,VOLUNE=68.8CC ,PSA=7.40  . Arthritis LUMBAR BACK, SHOULDER, LEFT KNEE, RIGHT WRIST  . DJD (degenerative joint disease)   . Chronic joint pain   . GERD (gastroesophageal reflux disease)   . Seasonal allergies   . 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 History of head injury 2010-  CLOSED  W/ CONCUSSION--   NO RESIDUAL  . Eczema    Past Surgical History  Procedure Laterality Date  . Rotator cuff repair  01-10-2004    RIGHT  . Radioactive seed implant  09/07/2011    Procedure: RADIOACTIVE SEED IMPLANT;  Surgeon: Claybon Jabs, MD;  Location: [facility];  Service: Urology;  Laterality: N/A;  86  seeds implanted  . Cystoscopy  09/07/2011    Procedure: CYSTOSCOPY;  Surgeon: Claybon Jabs, MD;  Location[facility];  Service: Urology;  Laterality: N/A;  no seeds noted in bladder   Family History  Problem Relation  Age of Onset  . Prostate cancer Maternal Uncle    History  Substance Use Topics  . Smoking status: Former Smoker    Types: Cigarettes    Quit date: 09/02/1973  . Smokeless tobacco: Never Used     Comment: QUIT SMOKING 1970'S  . Alcohol Use: No    Review of Systems  All other systems reviewed and are negative.     Allergies  Review of patient's allergies indicates no known allergies.  Home Medications   Prior to Admission medications   Medication Sig Start Date End Date Taking? Authorizing Provider  aspirin 81 MG chewable tablet Chew 81 mg by mouth daily.   Yes Historical Provider, MD  gabapentin (NEURONTIN) 300 MG capsule Take 600 mg by mouth 3 (three) times daily.    Yes Historical Provider, MD  glipiZIDE (GLUCOTROL XL) 10 MG 24 hr tablet Take 10 mg by mouth daily.   Yes Historical Provider, MD  metFORMIN (GLUCOPHAGE) 1000 MG tablet Take 1,000 mg by mouth 2 (two) times daily with a meal.   Yes Historical Provider, MD  Multiple Vitamin (MULTIVITAMIN WITH MINERALS) TABS Take 1 tablet by mouth daily.   Yes Historical Provider, MD  tamsulosin (FLOMAX) 0.4 MG CAPS capsule Take 0.4 mg by mouth daily after supper.   Yes Historical Provider, MD  sulfamethoxazole-trimethoprim (SEPTRA DS) 800-160 MG  per tablet Take 1 tablet by mouth every 12 (twelve) hours. Patient not taking: Reported on 03/05/2014 01/25/14   Orpah Greek, MD   BP 148/82 mmHg  Pulse 70  Temp(Src) 97.7 F (36.5 C) (Oral)  Resp 16  SpO2 100% Physical Exam  Constitutional: He is oriented to person, place, and time. He appears well-developed and well-nourished.  HENT:  Head: Normocephalic and atraumatic.  Cardiovascular: Normal rate.   No murmur heard. Pulmonary/Chest: Effort normal. No respiratory distress.  Abdominal: Soft. There is no tenderness. There is no rebound and no guarding.  Genitourinary:   Foley catheter in place with amber urine in the bag. There is slight drainage around the urethral  meatus that is watery in nature. There is no swelling of the glans and there is no purulent discharge.  Musculoskeletal: He exhibits no edema or tenderness.  Neurological: He is alert and oriented to person, place, and time.  Skin: Skin is warm and dry.  Psychiatric: He has a normal mood and affect. His behavior is normal.  Nursing note and vitals reviewed.   ED Course  Procedures (including critical care time) Labs Review Labs Reviewed  URINE CULTURE  URINALYSIS, ROUTINE W REFLEX MICROSCOPIC    Imaging Review No results found.   EKG Interpretation None      MDM   Final diagnoses:  Urinary retention  Encounter for Foley catheter replacement    Patient here requesting Foley catheter change for chronic urinary retention.  Sending UA and culture based on symptoms of change in odor, but it do not feel that treatment of UTI is indicated at this time unless there are significant findings on culture. Patient does not have any systemic symptoms at this time.    Quintella Reichert, MD 03/05/14 [phone]

## 2014-03-05 NOTE — ED Notes (Signed)
Pt has 56fr foley catheter in place on arrival. 10cc of fluid removed from balloon and catheter removed.

## 2014-03-07 LAB — URINE CULTURE: Colony Count: >=100000

## 2014-07-10 ENCOUNTER — Emergency Department (HOSPITAL_COMMUNITY)
Admission: EM | Admit: 2014-07-10 | Discharge: 2014-07-10 | Disposition: A | Discharge status: Home / Self Care | Payer: Medicare HMO | Attending: Emergency Medicine | Admitting: Emergency Medicine

## 2014-07-10 ENCOUNTER — Encounter (HOSPITAL_COMMUNITY): Payer: Self-pay | Admitting: Emergency Medicine

## 2014-07-10 DIAGNOSIS — K219 Gastro-esophageal reflux disease without esophagitis: Secondary | ICD-10-CM | POA: Insufficient documentation

## 2014-07-10 DIAGNOSIS — J45909 Unspecified asthma, uncomplicated: Secondary | ICD-10-CM | POA: Insufficient documentation

## 2014-07-10 DIAGNOSIS — R339 Retention of urine, unspecified: Secondary | ICD-10-CM | POA: Diagnosis present

## 2014-07-10 DIAGNOSIS — M1990 Unspecified osteoarthritis, unspecified site: Secondary | ICD-10-CM | POA: Diagnosis not present

## 2014-07-10 DIAGNOSIS — Z872 Personal history of diseases of the skin and subcutaneous tissue: Secondary | ICD-10-CM | POA: Insufficient documentation

## 2014-07-10 DIAGNOSIS — Z8546 Personal history of malignant neoplasm of prostate: Secondary | ICD-10-CM | POA: Diagnosis not present

## 2014-07-10 DIAGNOSIS — N390 Urinary tract infection, site not specified: Secondary | ICD-10-CM | POA: Diagnosis not present

## 2014-07-10 DIAGNOSIS — Z79899 Other long term (current) drug therapy: Secondary | ICD-10-CM | POA: Insufficient documentation

## 2014-07-10 DIAGNOSIS — Y846 Urinary catheterization as the cause of abnormal reaction of the patient, or of later complication, without mention of misadventure at the time of the procedure: Secondary | ICD-10-CM | POA: Insufficient documentation

## 2014-07-10 DIAGNOSIS — Z87891 Personal history of nicotine dependence: Secondary | ICD-10-CM | POA: Insufficient documentation

## 2014-07-10 DIAGNOSIS — T83098A Other mechanical complication of other indwelling urethral catheter, initial encounter: Secondary | ICD-10-CM | POA: Diagnosis not present

## 2014-07-10 DIAGNOSIS — Z7982 Long term (current) use of aspirin: Secondary | ICD-10-CM | POA: Insufficient documentation

## 2014-07-10 DIAGNOSIS — G8929 Other chronic pain: Secondary | ICD-10-CM | POA: Insufficient documentation

## 2014-07-10 LAB — BASIC METABOLIC PANEL
Anion gap: 9 (ref 5–15)
BUN: 21 mg/dL (ref 6–23)
CALCIUM: 9 mg/dL (ref 8.4–10.5)
CO2: 21 mmol/L (ref 19–32)
CREATININE: 1.16 mg/dL (ref 0.50–1.35)
Chloride: 110 mmol/L (ref 96–112)
GFR calc Af Amer: 77 mL/min — ABNORMAL LOW (ref >90)
GFR, EST NON AFRICAN AMERICAN: 66 mL/min — AB (ref >90)
Glucose, Bld: 178 mg/dL — ABNORMAL HIGH (ref 70–99)
Potassium: 4.4 mmol/L (ref 3.5–5.1)
Sodium: 140 mmol/L (ref 135–145)

## 2014-07-10 LAB — URINALYSIS, ROUTINE W REFLEX MICROSCOPIC
Bilirubin Urine: NEGATIVE
Glucose, UA: 100 mg/dL — AB
Ketones, ur: NEGATIVE mg/dL
NITRITE: POSITIVE — AB
Specific Gravity, Urine: 1.018 (ref 1.005–1.030)
Urobilinogen, UA: 0.2 mg/dL (ref 0.0–1.0)
pH: 7 (ref 5.0–8.0)

## 2014-07-10 LAB — URINE MICROSCOPIC-ADD ON

## 2014-07-10 MED ORDER — SULFAMETHOXAZOLE-TRIMETHOPRIM 800-160 MG PO TABS: 1.0000 | ORAL_TABLET | Freq: Two times a day (BID) | ORAL | Status: DC

## 2014-07-10 NOTE — ED Provider Notes (Signed)
CSN: [id number]     Arrival date & time 07/10/14  0174 History   First MD Initiated Contact with Patient 07/10/14 [phone]     Chief Complaint  Patient presents with  . Foley clogged      (Consider location/radiation/quality/duration/timing/severity/associated sxs/prior Treatment) HPI  Pt with indwelling foley states that over the past couple of days his foley has been difficult to flush.  He has also noted decreased urine output.  Last night he could not flush foley at all and began to develop suprapubic pain.  No fever/chills. No nausea or vomiting.  Noted that urine was thicker than normal.  pain in lower abdomen is cramping and constant.  There are no other associated systemic symptoms, there are no other alleviating or modifying factors.   Past Medical History  Diagnosis Date  . ED (erectile dysfunction)   . Mild asthma SEASONAL -  PT HAS NO INHALER  . Diabetes mellitus ORAL MEDS    TYPE II  . Prostate cancer 05/01/2011 BX    ADENOCARCINOMA,gLEASON= 3+3=6,VOLUNE=68.8CC ,PSA=7.40  . Arthritis LUMBAR BACK, SHOULDER, LEFT KNEE, RIGHT WRIST  . DJD (degenerative joint disease)   . Chronic joint pain   . GERD (gastroesophageal reflux disease)   . Seasonal allergies   . 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  1997 (15 YRS AGO)    [place]  . History of head injury 2010-  CLOSED  W/ CONCUSSION--   NO RESIDUAL  . Eczema    Past Surgical History  Procedure Laterality Date  . Rotator cuff repair  01-10-2004    RIGHT  . Radioactive seed implant  09/07/2011    Procedure: RADIOACTIVE SEED IMPLANT;  Surgeon: Claybon Jabs, MD;  Location: [facility];  Service: Urology;  Laterality: N/A;  84  seeds implanted  . Cystoscopy  09/07/2011    Procedure: CYSTOSCOPY;  Surgeon: Claybon Jabs, MD;  Location: [facility];  Service: Urology;  Laterality: N/A;  no seeds noted in bladder   Family History  Problem  Relation Age of Onset  . Prostate cancer Maternal Uncle    History  Substance Use Topics  . Smoking status: Former Smoker    Types: Cigarettes    Quit date: 09/02/1973  . Smokeless tobacco: Never Used     Comment: QUIT SMOKING 1970'S  . Alcohol Use: No    Review of Systems  ROS reviewed and all otherwise negative except for mentioned in HPI    Allergies  Review of patient's allergies indicates no known allergies.  Home Medications   Prior to Admission medications   Medication Sig Start Date End Date Taking? Authorizing Provider  aspirin 81 MG chewable tablet Chew 81 mg by mouth daily.   Yes Historical Provider, MD  glipiZIDE (GLUCOTROL XL) 10 MG 24 hr tablet Take 10 mg by mouth daily.   Yes Historical Provider, MD  metFORMIN (GLUCOPHAGE) 1000 MG tablet Take 1,000 mg by mouth 2 (two) times daily with a meal.   Yes Historical Provider, MD  Multiple Vitamin (MULTIVITAMIN WITH MINERALS) TABS Take 1 tablet by mouth daily.   Yes Historical Provider, MD  pregabalin (LYRICA) 50 MG capsule Take 50 mg by mouth 3 (three) times daily.   Yes Historical Provider, MD  tamsulosin (FLOMAX) 0.4 MG CAPS capsule Take 0.4 mg by mouth daily after supper.   Yes Historical Provider, MD  sulfamethoxazole-trimethoprim (SEPTRA DS) 800-160 MG per tablet Take  1 tablet by mouth 2 (two) times daily. 07/10/14   Alfonzo Beers, MD   BP 135/90 mmHg  Pulse 65  Temp(Src) 99.1 F (37.3 C) (Oral)  Resp 18  SpO2 99%  Vitals reviewed Physical Exam  Physical Examination: General appearance - alert, well appearing, and in no distress Mental status - alert, oriented to person, place, and time Eyes - no conjunctival injection, no scleral icterus Chest - clear to auscultation, no wheezes, rales or rhonchi, symmetric air entry Heart - normal rate, regular rhythm, normal S1, S2, no murmurs, rubs, clicks or gallops Abdomen - soft, nontender, nondistended, no masses or organomegaly GU Male - foley catheter in place  draining cloudy urine with sediment Extremities - peripheral pulses normal, no pedal edema, no clubbing or cyanosis Skin - normal coloration and turgor, no rashes  ED Course  Procedures (including critical care time) Labs Review Labs Reviewed  URINALYSIS, ROUTINE W REFLEX MICROSCOPIC - Abnormal; Notable for the following:    APPearance TURBID (*)    Glucose, UA 100 (*)    Hgb urine dipstick LARGE (*)    Protein, ur >300 (*)    Nitrite POSITIVE (*)    Leukocytes, UA LARGE (*)    All other components within normal limits  BASIC METABOLIC PANEL - Abnormal; Notable for the following:    Glucose, Bld 178 (*)    GFR calc non Af Amer 66 (*)    GFR calc Af Amer 77 (*)    All other components within normal limits  URINE MICROSCOPIC-ADD ON - Abnormal; Notable for the following:    Bacteria, UA MANY (*)    All other components within normal limits  URINE CULTURE    Imaging Review No results found.   EKG Interpretation None      MDM   Final diagnoses:  Urinary retention  Complicated UTI (urinary tract infection)    Pt presenting with decreased urine output from his foley which was clogged.  New foley replaced and patient felt much improved.  Urine from new foley showed signs of UTI.   Labs reassuring with no signs of bump in his creatinine.  Pt is overall nontoxic appearing, no systemic signs of infection.   10:00 AM per prior urine culture results- they have all been sensitive to bactrim- will start for current UTI.  Urine culture pending.     Alfonzo Beers, MD 07/11/14 [phone]

## 2014-07-10 NOTE — ED Notes (Signed)
Pt provided with sandwich and diet soda per request

## 2014-07-10 NOTE — Discharge Instructions (Signed)
Return to the ED with any concerns including vomiting, fever/chills, decreased urine in foley, abdominal pain, decreased level of alertness/lethargy, or any other alarming symptoms

## 2014-07-10 NOTE — ED Notes (Signed)
Pt from home c/o catheter not draining and difficulty flushing x2 days. Pt is uncomfortable and pacing. Pt c/o bladder pain 10/10

## 2014-07-11 LAB — URINE CULTURE

## 2014-07-20 ENCOUNTER — Emergency Department (HOSPITAL_COMMUNITY)
Admission: EM | Admit: 2014-07-20 | Discharge: 2014-07-20 | Disposition: A | Discharge status: Home / Self Care | Payer: Medicare HMO | Attending: Emergency Medicine | Admitting: Emergency Medicine

## 2014-07-20 ENCOUNTER — Encounter (HOSPITAL_COMMUNITY): Payer: Self-pay

## 2014-07-20 DIAGNOSIS — G8929 Other chronic pain: Secondary | ICD-10-CM | POA: Diagnosis not present

## 2014-07-20 DIAGNOSIS — E119 Type 2 diabetes mellitus without complications: Secondary | ICD-10-CM | POA: Diagnosis not present

## 2014-07-20 DIAGNOSIS — Z87438 Personal history of other diseases of male genital organs: Secondary | ICD-10-CM | POA: Insufficient documentation

## 2014-07-20 DIAGNOSIS — Z7982 Long term (current) use of aspirin: Secondary | ICD-10-CM | POA: Diagnosis not present

## 2014-07-20 DIAGNOSIS — Z8546 Personal history of malignant neoplasm of prostate: Secondary | ICD-10-CM | POA: Diagnosis not present

## 2014-07-20 DIAGNOSIS — Z792 Long term (current) use of antibiotics: Secondary | ICD-10-CM | POA: Diagnosis not present

## 2014-07-20 DIAGNOSIS — Z87891 Personal history of nicotine dependence: Secondary | ICD-10-CM | POA: Diagnosis not present

## 2014-07-20 DIAGNOSIS — Z87828 Personal history of other (healed) physical injury and trauma: Secondary | ICD-10-CM | POA: Diagnosis not present

## 2014-07-20 DIAGNOSIS — J4521 Mild intermittent asthma with (acute) exacerbation: Secondary | ICD-10-CM | POA: Insufficient documentation

## 2014-07-20 DIAGNOSIS — Y846 Urinary catheterization as the cause of abnormal reaction of the patient, or of later complication, without mention of misadventure at the time of the procedure: Secondary | ICD-10-CM | POA: Insufficient documentation

## 2014-07-20 DIAGNOSIS — Z79899 Other long term (current) drug therapy: Secondary | ICD-10-CM | POA: Diagnosis not present

## 2014-07-20 DIAGNOSIS — Z8719 Personal history of other diseases of the digestive system: Secondary | ICD-10-CM | POA: Diagnosis not present

## 2014-07-20 DIAGNOSIS — Z872 Personal history of diseases of the skin and subcutaneous tissue: Secondary | ICD-10-CM | POA: Insufficient documentation

## 2014-07-20 DIAGNOSIS — T83098A Other mechanical complication of other indwelling urethral catheter, initial encounter: Secondary | ICD-10-CM | POA: Insufficient documentation

## 2014-07-20 DIAGNOSIS — R339 Retention of urine, unspecified: Secondary | ICD-10-CM | POA: Diagnosis present

## 2014-07-20 DIAGNOSIS — T839XXA Unspecified complication of genitourinary prosthetic device, implant and graft, initial encounter: Secondary | ICD-10-CM

## 2014-07-20 NOTE — ED Notes (Signed)
Pt states that his catheter stopped draining this evening about 9pm, he tried to flush it and the flush wouldn't go through

## 2014-07-20 NOTE — ED Notes (Signed)
Pt given crackers and a drink

## 2014-07-20 NOTE — ED Notes (Signed)
Pt's catheter would not irrigate, changed catheter with new 16 f, with immediate return.

## 2014-07-20 NOTE — Discharge Instructions (Signed)
Your Foley catheter was changed out.  It had a mucous plug that was blocking flow.  Please continue taking antibiotic for UTI.  Please call Dr. Karsten Ro  today for follow-up

## 2014-07-20 NOTE — ED Provider Notes (Signed)
CSN: [id number]     Arrival date & time 07/20/14  8841 History   First MD Initiated Contact with Patient 07/20/14 [phone]     Chief Complaint  Patient presents with  . Urinary Retention     (Consider location/radiation/quality/duration/timing/severity/associated sxs/prior Treatment) HPI Comments: Patient with a Foley catheter in place due to urinary retention presents with abdominal pain and decreased flow from his catheter.  He says it stopped about 9 PM he tried flushing it, but was unable to.  He states today was a good day he was very active without any discomfort.  Denies any fevers, diarrhea  The history is provided by the patient.    Past Medical History  Diagnosis Date  . ED (erectile dysfunction)   . Mild asthma SEASONAL -  PT HAS NO INHALER  . Diabetes mellitus ORAL MEDS    TYPE II  . Prostate cancer 05/01/2011 BX    ADENOCARCINOMA,gLEASON= 3+3=6,VOLUNE=68.8CC ,PSA=7.40  . Arthritis LUMBAR BACK, SHOULDER, LEFT KNEE, RIGHT WRIST  . DJD (degenerative joint disease)   . Chronic joint pain   . GERD (gastroesophageal reflux disease)   . Seasonal allergies   . 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  1997 (15 YRS AGO)    [place]  . History of head injury 2010-  CLOSED  W/ CONCUSSION--   NO RESIDUAL  . Eczema    Past Surgical History  Procedure Laterality Date  . Rotator cuff repair  01-10-2004    RIGHT  . Radioactive seed implant  09/07/2011    Procedure: RADIOACTIVE SEED IMPLANT;  Surgeon: Claybon Jabs, MD;  Location: [facility];  Service: Urology;  Laterality: N/A;  86  seeds implanted  . Cystoscopy  09/07/2011    Procedure: CYSTOSCOPY;  Surgeon: Claybon Jabs, MD;  Location: [facility];  Service: Urology;  Laterality: N/A;  no seeds noted in bladder   Family History  Problem Relation Age of Onset  . Prostate cancer Maternal Uncle    History  Substance Use Topics  .  Smoking status: Former Smoker    Types: Cigarettes    Quit date: 09/02/1973  . Smokeless tobacco: Never Used     Comment: QUIT SMOKING 1970'S  . Alcohol Use: No    Review of Systems  Constitutional: Negative for fever.  Gastrointestinal: Positive for abdominal distention.  Genitourinary: Positive for decreased urine volume. Negative for dysuria and flank pain.  All other systems reviewed and are negative.     Allergies  Review of patient's allergies indicates no known allergies.  Home Medications   Prior to Admission medications   Medication Sig Start Date End Date Taking? Authorizing Provider  aspirin 81 MG chewable tablet Chew 81 mg by mouth daily.    Historical Provider, MD  glipiZIDE (GLUCOTROL XL) 10 MG 24 hr tablet Take 10 mg by mouth daily.    Historical Provider, MD  metFORMIN (GLUCOPHAGE) 1000 MG tablet Take 1,000 mg by mouth 2 (two) times daily with a meal.    Historical Provider, MD  Multiple Vitamin (MULTIVITAMIN WITH MINERALS) TABS Take 1 tablet by mouth daily.    Historical Provider, MD  pregabalin (LYRICA) 50 MG capsule Take 50 mg by mouth 3 (three) times daily.    Historical Provider, MD  sulfamethoxazole-trimethoprim (SEPTRA DS) 800-160 MG per tablet Take 1 tablet by mouth 2 (two) times daily. 07/10/14   Alfonzo Beers, MD  tamsulosin [facility]) 0.4  MG CAPS capsule Take 0.4 mg by mouth daily after supper.    Historical Provider, MD   BP 137/92 mmHg  Pulse 72  Temp(Src) 98.4 F (36.9 C) (Oral)  Resp 18  SpO2 99% Physical Exam  Constitutional: He is oriented to person, place, and time. He appears well-developed and well-nourished.  HENT:  Head: Normocephalic.  Neck: Normal range of motion.  Cardiovascular: Normal rate and regular rhythm.   Pulmonary/Chest: Effort normal.  Abdominal: Soft. He exhibits distension. There is tenderness.  Musculoskeletal: Normal range of motion.  Neurological: He is alert and oriented to person, place, and time.  Skin: Skin is  warm.  Vitals reviewed.   ED Course  Procedures (including critical care time) Labs Review Labs Reviewed - No data to display  Imaging Review No results found.   EKG Interpretation None     full catheter could not be irrigated.  It was removed and changed out.  It appeared to have a mucous plug in the drainage.  Os.  Patient is feeling significantly better.  He is currently taking Septra for UTI.  Recommend that he continue this and call Dr. Karsten Ro this morning for follow-up  MDM   Final diagnoses:  Foley catheter problem, initial encounter         Junius Creamer, NP 07/20/14 [phone]  Rolland Porter, MD 07/20/14 [phone]

## 2014-07-20 NOTE — ED Notes (Signed)
Pt trying to call his daughter for a ride home

## 2014-09-22 ENCOUNTER — Emergency Department (HOSPITAL_COMMUNITY)
Admission: EM | Admit: 2014-09-22 | Discharge: 2014-09-22 | Disposition: A | Discharge status: Home / Self Care | Payer: Medicare HMO | Attending: Emergency Medicine | Admitting: Emergency Medicine

## 2014-09-22 ENCOUNTER — Encounter (HOSPITAL_COMMUNITY): Payer: Self-pay | Admitting: Emergency Medicine

## 2014-09-22 DIAGNOSIS — Z7982 Long term (current) use of aspirin: Secondary | ICD-10-CM | POA: Diagnosis not present

## 2014-09-22 DIAGNOSIS — N390 Urinary tract infection, site not specified: Secondary | ICD-10-CM | POA: Diagnosis present

## 2014-09-22 DIAGNOSIS — E119 Type 2 diabetes mellitus without complications: Secondary | ICD-10-CM | POA: Insufficient documentation

## 2014-09-22 DIAGNOSIS — G8928 Other chronic postprocedural pain: Secondary | ICD-10-CM | POA: Diagnosis not present

## 2014-09-22 DIAGNOSIS — Z87891 Personal history of nicotine dependence: Secondary | ICD-10-CM | POA: Insufficient documentation

## 2014-09-22 DIAGNOSIS — Z87828 Personal history of other (healed) physical injury and trauma: Secondary | ICD-10-CM | POA: Diagnosis not present

## 2014-09-22 DIAGNOSIS — T83511A Infection and inflammatory reaction due to indwelling urethral catheter, initial encounter: Secondary | ICD-10-CM

## 2014-09-22 DIAGNOSIS — Z8546 Personal history of malignant neoplasm of prostate: Secondary | ICD-10-CM | POA: Diagnosis not present

## 2014-09-22 DIAGNOSIS — Z79899 Other long term (current) drug therapy: Secondary | ICD-10-CM | POA: Diagnosis not present

## 2014-09-22 DIAGNOSIS — J45909 Unspecified asthma, uncomplicated: Secondary | ICD-10-CM | POA: Insufficient documentation

## 2014-09-22 DIAGNOSIS — Z8719 Personal history of other diseases of the digestive system: Secondary | ICD-10-CM | POA: Diagnosis not present

## 2014-09-22 DIAGNOSIS — Z87438 Personal history of other diseases of male genital organs: Secondary | ICD-10-CM | POA: Insufficient documentation

## 2014-09-22 LAB — URINALYSIS, ROUTINE W REFLEX MICROSCOPIC
BILIRUBIN URINE: NEGATIVE
Glucose, UA: NEGATIVE mg/dL
KETONES UR: NEGATIVE mg/dL
NITRITE: POSITIVE — AB
PH: 5.5 (ref 5.0–8.0)
Protein, ur: 100 mg/dL — AB
Specific Gravity, Urine: 1.019 (ref 1.005–1.030)
Urobilinogen, UA: 1 mg/dL (ref 0.0–1.0)

## 2014-09-22 LAB — URINE MICROSCOPIC-ADD ON

## 2014-09-22 MED ORDER — SULFAMETHOXAZOLE-TRIMETHOPRIM 800-160 MG PO TABS: 1.0000 | ORAL_TABLET | Freq: Two times a day (BID) | ORAL | Status: DC

## 2014-09-22 MED ORDER — CEPHALEXIN 500 MG PO CAPS: 500.0000 mg | ORAL_CAPSULE | Freq: Four times a day (QID) | ORAL | Status: DC

## 2014-09-22 MED ORDER — CEFTRIAXONE SODIUM 1 G IJ SOLR
1.0000 g | Freq: Once | INTRAMUSCULAR | Status: AC
  Administered 2014-09-22: 1 g via INTRAMUSCULAR
  Filled 2014-09-22: qty 10

## 2014-09-22 NOTE — ED Provider Notes (Signed)
CSN: [id number]     Arrival date & time 09/22/14  4562 History   First MD Initiated Contact with Patient 09/22/14 1129     Chief Complaint  Patient presents with  . Urinary Tract Infection     (Consider location/radiation/quality/duration/timing/severity/associated sxs/prior Treatment) HPI  This is a 64 year old man with a past medical history of type 2 diabetes, adenocarcinoma of the prostate, status post radioactive seeding with a chronic indwelling catheter who presents emergency Department for pain and discharge at the urethral meatus, and complaint of urinary tract infection. He is followed by [facility]. The patient states that his catheter is about 5 days overdue for change. It was last changed 08/17/2014. The patient states that he has a bill at [employer] and that eventhough He pays $100 at each monthly visit, he was refused service today unless he could pay $200. He states that he is on disability and has a fixed income so he couldn't pay. He states that he began having pain , foul odor, and purulent discharge around the foley catheter about 5 days ago. He denies any fever, chills or myalgias.   Past Medical History  Diagnosis Date  . ED (erectile dysfunction)   . Mild asthma SEASONAL -  PT HAS NO INHALER  . Diabetes mellitus ORAL MEDS    TYPE II  . Prostate cancer 05/01/2011 BX    ADENOCARCINOMA,gLEASON= 3+3=6,VOLUNE=68.8CC ,PSA=7.40  . Arthritis LUMBAR BACK, SHOULDER, LEFT KNEE, RIGHT WRIST  . DJD (degenerative joint disease)   . Chronic joint pain   . GERD (gastroesophageal reflux disease)   . Seasonal allergies   . 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  1997 (15 YRS AGO)    [place]  . History of head injury 2010-  CLOSED  W/ CONCUSSION--   NO RESIDUAL  . Eczema    Past Surgical History  Procedure Laterality Date  . Rotator cuff repair  01-10-2004    RIGHT  . Radioactive seed implant  09/07/2011   Procedure: RADIOACTIVE SEED IMPLANT;  Surgeon: Claybon Jabs, MD;  Location: [facility];  Service: Urology;  Laterality: N/A;  86  seeds implanted  . Cystoscopy  09/07/2011    Procedure: CYSTOSCOPY;  Surgeon: Claybon Jabs, MD;  Location: [facility];  Service: Urology;  Laterality: N/A;  no seeds noted in bladder   Family History  Problem Relation Age of Onset  . Prostate cancer Maternal Uncle    History  Substance Use Topics  . Smoking status: Former Smoker    Types: Cigarettes    Quit date: 09/02/1973  . Smokeless tobacco: Never Used     Comment: QUIT SMOKING 1970'S  . Alcohol Use: No    Review of Systems  Ten systems reviewed and are negative for acute change, except as noted in the HPI.    Allergies  Review of patient's allergies indicates no known allergies.  Home Medications   Prior to Admission medications   Medication Sig Start Date End Date Taking? Authorizing Provider  aspirin 81 MG chewable tablet Chew 81 mg by mouth daily.   Yes Historical Provider, MD  glipiZIDE (GLUCOTROL XL) 10 MG 24 hr tablet Take 10 mg by mouth daily.   Yes Historical Provider, MD  metFORMIN (GLUCOPHAGE) 1000 MG tablet Take 1,000 mg by mouth 2 (two) times daily with a meal.   Yes Historical Provider, MD  Multiple Vitamin (MULTIVITAMIN WITH MINERALS) TABS Take  1 tablet by mouth daily.   Yes Historical Provider, MD  pregabalin (LYRICA) 50 MG capsule Take 50 mg by mouth 3 (three) times daily.   Yes Historical Provider, MD  tamsulosin (FLOMAX) 0.4 MG CAPS capsule Take 0.4 mg by mouth daily after supper.   Yes Historical Provider, MD  sulfamethoxazole-trimethoprim (SEPTRA DS) 800-160 MG per tablet Take 1 tablet by mouth 2 (two) times daily. Patient not taking: Reported on 09/22/2014 07/10/14   Alfonzo Beers, MD   BP 135/86 mmHg  Pulse 65  Temp(Src) 98 F (36.7 C) (Oral)  Resp 14  SpO2 98% Physical Exam  Constitutional: He is oriented to person, place, and time.  He appears well-developed and well-nourished. No distress.  HENT:  Head: Normocephalic and atraumatic.  Eyes: Conjunctivae and EOM are normal. Pupils are equal, round, and reactive to light. No scleral icterus.  Neck: Normal range of motion. Neck supple.  Cardiovascular: Normal rate, regular rhythm and normal heart sounds.   Pulmonary/Chest: Effort normal and breath sounds normal. No respiratory distress.  Abdominal: Soft. He exhibits no distension and no mass. There is no tenderness. There is no rebound and no guarding.  Genitourinary:    Circumcised.  Musculoskeletal: Normal range of motion. He exhibits no edema.  Neurological: He is alert and oriented to person, place, and time.  Skin: Skin is warm and dry. He is not diaphoretic.  Psychiatric: His behavior is normal.  Nursing note and vitals reviewed.   ED Course  Procedures (including critical care time) Labs Review Labs Reviewed  URINALYSIS, ROUTINE W REFLEX MICROSCOPIC (NOT AT [facility]) - Abnormal; Notable for the following:    Color, Urine AMBER (*)    APPearance TURBID (*)    Hgb urine dipstick LARGE (*)    Protein, ur 100 (*)    Nitrite POSITIVE (*)    Leukocytes, UA LARGE (*)    All other components within normal limits  URINE MICROSCOPIC-ADD ON - Abnormal; Notable for the following:    Bacteria, UA FEW (*)    All other components within normal limits  URINE CULTURE    Imaging Review No results found.   EKG Interpretation None      MDM   Final diagnoses:  UTI (urinary tract infection) due to urinary indwelling catheter    Patient with chronic indwelling Foley catheter. His urine is always positive for urinary tract infection. Review of his culture report shows history of both MRSA UTI as well as Escherichia coli. Patient given Rocephin IM here. We'll discharge on Keflex and Bactrim. Patient is advised to follow-up with [facility] for further management.    Margarita Mail, PA-C 09/22/14 [place], MD 09/23/14 0900

## 2014-09-22 NOTE — Discharge Instructions (Signed)

## 2014-09-22 NOTE — ED Notes (Signed)
Pt states he has a foley cath in place that is supposed to be changed every 30 days  Last changed May 3 rd  Pt states he is having discharge from his penis, pain, strong smell to his urine

## 2014-09-23 LAB — URINE CULTURE: Colony Count: >=100000

## 2014-10-11 ENCOUNTER — Other Ambulatory Visit: Payer: Self-pay

## 2014-10-20 ENCOUNTER — Encounter (HOSPITAL_COMMUNITY): Payer: Self-pay | Admitting: Emergency Medicine

## 2014-10-20 ENCOUNTER — Emergency Department (HOSPITAL_COMMUNITY)
Admission: EM | Admit: 2014-10-20 | Discharge: 2014-10-20 | Disposition: A | Discharge status: Home / Self Care | Payer: Medicare HMO | Attending: Emergency Medicine | Admitting: Emergency Medicine

## 2014-10-20 DIAGNOSIS — Z87891 Personal history of nicotine dependence: Secondary | ICD-10-CM | POA: Insufficient documentation

## 2014-10-20 DIAGNOSIS — G8929 Other chronic pain: Secondary | ICD-10-CM | POA: Diagnosis not present

## 2014-10-20 DIAGNOSIS — M1990 Unspecified osteoarthritis, unspecified site: Secondary | ICD-10-CM | POA: Insufficient documentation

## 2014-10-20 DIAGNOSIS — Z466 Encounter for fitting and adjustment of urinary device: Secondary | ICD-10-CM

## 2014-10-20 DIAGNOSIS — B009 Herpesviral infection, unspecified: Secondary | ICD-10-CM

## 2014-10-20 DIAGNOSIS — Z792 Long term (current) use of antibiotics: Secondary | ICD-10-CM | POA: Diagnosis not present

## 2014-10-20 DIAGNOSIS — Z79899 Other long term (current) drug therapy: Secondary | ICD-10-CM | POA: Insufficient documentation

## 2014-10-20 DIAGNOSIS — Z8546 Personal history of malignant neoplasm of prostate: Secondary | ICD-10-CM | POA: Diagnosis not present

## 2014-10-20 DIAGNOSIS — Z7982 Long term (current) use of aspirin: Secondary | ICD-10-CM | POA: Insufficient documentation

## 2014-10-20 DIAGNOSIS — Z87438 Personal history of other diseases of male genital organs: Secondary | ICD-10-CM | POA: Diagnosis not present

## 2014-10-20 DIAGNOSIS — J4530 Mild persistent asthma, uncomplicated: Secondary | ICD-10-CM | POA: Diagnosis not present

## 2014-10-20 DIAGNOSIS — E119 Type 2 diabetes mellitus without complications: Secondary | ICD-10-CM | POA: Insufficient documentation

## 2014-10-20 DIAGNOSIS — Z872 Personal history of diseases of the skin and subcutaneous tissue: Secondary | ICD-10-CM | POA: Insufficient documentation

## 2014-10-20 DIAGNOSIS — Z8719 Personal history of other diseases of the digestive system: Secondary | ICD-10-CM | POA: Diagnosis not present

## 2014-10-20 DIAGNOSIS — Z87828 Personal history of other (healed) physical injury and trauma: Secondary | ICD-10-CM | POA: Insufficient documentation

## 2014-10-20 DIAGNOSIS — B001 Herpesviral vesicular dermatitis: Secondary | ICD-10-CM | POA: Diagnosis not present

## 2014-10-20 MED ORDER — VALACYCLOVIR HCL 1 G PO TABS
1000.0000 mg | ORAL_TABLET | Freq: Two times a day (BID) | ORAL | Status: AC
Start: 2014-10-20 — End: 2014-11-03

## 2014-10-20 NOTE — ED Notes (Signed)
Pt alert and oriented x4. Respirations even and unlabored, bilateral symmetrical rise and fall of chest. Skin warm and dry. In no acute distress. Denies needs.   

## 2014-10-20 NOTE — ED Notes (Signed)
Pt escorted to discharge window. Pt verbalized understanding discharge instructions. In no acute distress. Leg bag provided to pt.

## 2014-10-20 NOTE — ED Provider Notes (Signed)
CSN: [id number]     Arrival date & time 10/20/14  0847 History   First MD Initiated Contact with Patient 10/20/14 [phone]     Chief Complaint  Patient presents with  . Foley Catheter Change    . Urine Odor    HPI   64 year old male with a past medical history significant for type 2 diabetes, adenocarcinoma of prostate, status post radioactive seeding with chronic indwelling catheter presents today for catheter change. Patient reports he went to [facility] today, was unable to pay the fever they requested. Last changed 09/22/2014 here in the ED. Patient denies painful urinations, change in color clarity; patient does note a "odor" in his urine. Patient reports that symptoms are not consistent with urinary tract infection, reports usually has pain with urinations. Patient denies discharge from the penis.  Past Medical History  Diagnosis Date  . ED (erectile dysfunction)   . Mild asthma SEASONAL -  PT HAS NO INHALER  . Diabetes mellitus ORAL MEDS    TYPE II  . Prostate cancer 05/01/2011 BX    ADENOCARCINOMA,gLEASON= 3+3=6,VOLUNE=68.8CC ,PSA=7.40  . Arthritis LUMBAR BACK, SHOULDER, LEFT KNEE, RIGHT WRIST  . DJD (degenerative joint disease)   . Chronic joint pain   . GERD (gastroesophageal reflux disease)   . Seasonal allergies   . 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  1997 (15 YRS AGO)    [place]  . History of head injury 2010-  CLOSED  W/ CONCUSSION--   NO RESIDUAL  . Eczema    Past Surgical History  Procedure Laterality Date  . Rotator cuff repair  01-10-2004    RIGHT  . Radioactive seed implant  09/07/2011    Procedure: RADIOACTIVE SEED IMPLANT;  Surgeon: Claybon Jabs, MD;  Location[facility];  Service: Urology;  Laterality: N/A;  84  seeds implanted  . Cystoscopy  09/07/2011    Procedure: CYSTOSCOPY;  Surgeon: Claybon Jabs, MD;  Location[facility]  Service: Urology;   Laterality: N/A;  no seeds noted in bladder   Family History  Problem Relation Age of Onset  . Prostate cancer Maternal Uncle    History  Substance Use Topics  . Smoking status: Former Smoker    Types: Cigarettes    Quit date: 09/02/1973  . Smokeless tobacco: Never Used     Comment: QUIT SMOKING 1970'S  . Alcohol Use: No    Review of Systems  All other systems reviewed and are negative.   Allergies  Review of patient's allergies indicates no known allergies.  Home Medications   Prior to Admission medications   Medication Sig Start Date End Date Taking? Authorizing Provider  aspirin 81 MG chewable tablet Chew 81 mg by mouth daily.    Historical Provider, MD  cephALEXin (KEFLEX) 500 MG capsule Take 1 capsule (500 mg total) by mouth 4 (four) times daily. 09/22/14   Margarita Mail, PA-C  glipiZIDE (GLUCOTROL XL) 10 MG 24 hr tablet Take 10 mg by mouth daily.    Historical Provider, MD  metFORMIN (GLUCOPHAGE) 1000 MG tablet Take 1,000 mg by mouth 2 (two) times daily with a meal.    Historical Provider, MD  Multiple Vitamin (MULTIVITAMIN WITH MINERALS) TABS Take 1 tablet by mouth daily.    Historical Provider, MD  pregabalin (LYRICA) 50 MG capsule Take 50 mg by mouth 3 (three) times daily.    Historical Provider, MD  sulfamethoxazole-trimethoprim (BACTRIM  DS,SEPTRA DS) 800-160 MG per tablet Take 1 tablet by mouth 2 (two) times daily. 09/22/14   Margarita Mail, PA-C  tamsulosin (FLOMAX) 0.4 MG CAPS capsule Take 0.4 mg by mouth daily after supper.    Historical Provider, MD  valACYclovir (VALTREX) 1000 MG tablet Take 1 tablet (1,000 mg total) by mouth 2 (two) times daily. 10/20/14 11/03/14  Dellis Filbert [place], PA-C   BP 123/72 mmHg  Pulse 67  Temp(Src) 98.1 F (36.7 C) (Oral)  Resp 16  SpO2 97%   Physical Exam  Constitutional: He is oriented to person, place, and time. He appears well-developed and well-nourished.  HENT:  Head: Normocephalic and atraumatic.  Eyes: Conjunctivae are normal.  Pupils are equal, round, and reactive to light. Right eye exhibits no discharge. Left eye exhibits no discharge. No scleral icterus.  Neck: Normal range of motion. No JVD present. No tracheal deviation present.  Pulmonary/Chest: Effort normal. No stridor.  Abdominal: He exhibits no mass. There is no tenderness. There is no rebound and no guarding.  Genitourinary: Testes normal and penis normal. Circumcised. No penile tenderness. No discharge found.  Catheter in place no signs of discharge, infection, or irritation  Neurological: He is alert and oriented to person, place, and time. Coordination normal.  Skin:  Numerous pustules on an erythematous base on the chin and right lower lip  Psychiatric: He has a normal mood and affect. His behavior is normal. Judgment and thought content normal.  Nursing note and vitals reviewed.   ED Course  Procedures (including critical care time) Labs Review Labs Reviewed - No data to display  Imaging Review No results found.   EKG Interpretation None      MDM   Final diagnoses:  Encounter for Foley catheter replacement  Herpes    Labs: None indicated  Imaging:   Consults:  Therapeutics: none  Assessment:  Plan: Patient presents for urinary catheter change. Patient was seen here in the ED approximately one month ago for the same. He is followed by [facility]rology but reports he is unable to afford the co-pay for the visit. Patient denies any significant symptoms today in ED related to the catheter urinary tract. Patient's catheter replaced. Patient also reports a rash to his chin, this is most consistent with herpes virus. He'll be started on Valtrex. Pt notes odor to his urine but denies any other complaints that would indicate UTI. Chart review shows urine is persistently positive for UTI and unlikely to yield relevant information today. Due to recent antibiotic exposure and no significant symptoms patient will not be treated today.  Patient is  instructed to contact [facility] inform them of your visit today. Please schedule follow-up evaluation. He is also instructed to follow-up with PCP for further evaluation and management. Strict return precautions given, patient verbalizes understanding for today's plan.      Margaret Staggs, PA-C 10/20/14 1701  Dorie Rank, MD 10/21/14 [phone]

## 2014-10-20 NOTE — ED Notes (Signed)
PA at bedside.

## 2014-10-20 NOTE — ED Notes (Signed)
Pt states that he has a foley catheter that has been in place for 33 days, pt states that he needs it changed. Pt states that he tried to see his doctor but could not pay what they asked for. Pt c/o strong odor eminating from his urethra. Pt denies current chemo or radiation treatments, states he has a radiation seed implant in his prostate for his Hx of prostate cancer.

## 2014-11-04 ENCOUNTER — Encounter (HOSPITAL_COMMUNITY): Payer: Self-pay | Admitting: Emergency Medicine

## 2014-11-04 ENCOUNTER — Emergency Department (HOSPITAL_COMMUNITY)
Admission: EM | Admit: 2014-11-04 | Discharge: 2014-11-04 | Disposition: A | Discharge status: Home / Self Care | Payer: Medicare HMO | Attending: Emergency Medicine | Admitting: Emergency Medicine

## 2014-11-04 DIAGNOSIS — J45909 Unspecified asthma, uncomplicated: Secondary | ICD-10-CM | POA: Insufficient documentation

## 2014-11-04 DIAGNOSIS — Z87891 Personal history of nicotine dependence: Secondary | ICD-10-CM | POA: Insufficient documentation

## 2014-11-04 DIAGNOSIS — Z8546 Personal history of malignant neoplasm of prostate: Secondary | ICD-10-CM | POA: Diagnosis not present

## 2014-11-04 DIAGNOSIS — Z466 Encounter for fitting and adjustment of urinary device: Secondary | ICD-10-CM | POA: Insufficient documentation

## 2014-11-04 DIAGNOSIS — Z87828 Personal history of other (healed) physical injury and trauma: Secondary | ICD-10-CM | POA: Insufficient documentation

## 2014-11-04 DIAGNOSIS — Z87438 Personal history of other diseases of male genital organs: Secondary | ICD-10-CM | POA: Diagnosis not present

## 2014-11-04 DIAGNOSIS — Z8719 Personal history of other diseases of the digestive system: Secondary | ICD-10-CM | POA: Insufficient documentation

## 2014-11-04 DIAGNOSIS — M1990 Unspecified osteoarthritis, unspecified site: Secondary | ICD-10-CM | POA: Insufficient documentation

## 2014-11-04 DIAGNOSIS — E119 Type 2 diabetes mellitus without complications: Secondary | ICD-10-CM | POA: Insufficient documentation

## 2014-11-04 DIAGNOSIS — Z79899 Other long term (current) drug therapy: Secondary | ICD-10-CM | POA: Insufficient documentation

## 2014-11-04 DIAGNOSIS — Z872 Personal history of diseases of the skin and subcutaneous tissue: Secondary | ICD-10-CM | POA: Diagnosis not present

## 2014-11-04 DIAGNOSIS — Z7982 Long term (current) use of aspirin: Secondary | ICD-10-CM | POA: Insufficient documentation

## 2014-11-04 DIAGNOSIS — G8929 Other chronic pain: Secondary | ICD-10-CM | POA: Diagnosis not present

## 2014-11-04 LAB — BASIC METABOLIC PANEL
Anion gap: 10 (ref 5–15)
BUN: 27 mg/dL — ABNORMAL HIGH (ref 6–20)
CALCIUM: 9.4 mg/dL (ref 8.9–10.3)
CO2: 20 mmol/L — ABNORMAL LOW (ref 22–32)
Chloride: 109 mmol/L (ref 101–111)
Creatinine, Ser: 1.18 mg/dL (ref 0.61–1.24)
Glucose, Bld: 253 mg/dL — ABNORMAL HIGH (ref 65–99)
Potassium: 4.5 mmol/L (ref 3.5–5.1)
Sodium: 139 mmol/L (ref 135–145)

## 2014-11-04 LAB — URINALYSIS, ROUTINE W REFLEX MICROSCOPIC
Bilirubin Urine: NEGATIVE
Ketones, ur: NEGATIVE mg/dL
NITRITE: POSITIVE — AB
PH: 5.5 (ref 5.0–8.0)
Protein, ur: >300 mg/dL — AB
SPECIFIC GRAVITY, URINE: 1.03 (ref 1.005–1.030)
Urobilinogen, UA: 1 mg/dL (ref 0.0–1.0)

## 2014-11-04 LAB — URINE MICROSCOPIC-ADD ON

## 2014-11-04 LAB — CBC WITH DIFFERENTIAL/PLATELET
Basophils Absolute: 0 10*3/uL (ref 0.0–0.1)
Basophils Relative: 0 % (ref 0–1)
Eosinophils Absolute: 0.4 10*3/uL (ref 0.0–0.7)
Eosinophils Relative: 5 % (ref 0–5)
HCT: 38.9 % — ABNORMAL LOW (ref 39.0–52.0)
Hemoglobin: 12.9 g/dL — ABNORMAL LOW (ref 13.0–17.0)
LYMPHS ABS: 1.8 10*3/uL (ref 0.7–4.0)
LYMPHS PCT: 23 % (ref 12–46)
MCH: 29.9 pg (ref 26.0–34.0)
MCHC: 33.2 g/dL (ref 30.0–36.0)
MCV: 90 fL (ref 78.0–100.0)
MONOS PCT: 10 % (ref 3–12)
Monocytes Absolute: 0.8 10*3/uL (ref 0.1–1.0)
Neutro Abs: 4.7 10*3/uL (ref 1.7–7.7)
Neutrophils Relative %: 62 % (ref 43–77)
Platelets: 282 10*3/uL (ref 150–400)
RBC: 4.32 MIL/uL (ref 4.22–5.81)
RDW: 14.2 % (ref 11.5–15.5)
WBC: 7.6 10*3/uL (ref 4.0–10.5)

## 2014-11-04 MED ORDER — CEPHALEXIN 500 MG PO CAPS: 500.0000 mg | ORAL_CAPSULE | Freq: Four times a day (QID) | ORAL | Status: DC

## 2014-11-04 NOTE — ED Provider Notes (Signed)
CSN: [id number]     Arrival date & time 11/04/14  1244 History   First MD Initiated Contact with Patient 11/04/14 1254     Chief Complaint  Patient presents with  . Recurrent UTI    odor, pain, discolored urine via foley    HPI Patient presents to the emergency room with complaints of discomfort in the suprapubic area. She has a history of a chronic indwelling Foley catheter. He has been seen in the emergency room several times in the past. Patient is unable to see [facility] because of financial difficulties. Denies any trouble with fevers or chills. No vomiting or diarrhea. He has a burning sensation in his penis. Has been draining properly. His urine does look cloudy. Past Medical History  Diagnosis Date  . ED (erectile dysfunction)   . Mild asthma SEASONAL -  PT HAS NO INHALER  . Diabetes mellitus ORAL MEDS    TYPE II  . Prostate cancer 05/01/2011 BX    ADENOCARCINOMA,gLEASON= 3+3=6,VOLUNE=68.8CC ,PSA=7.40  . Arthritis LUMBAR BACK, SHOULDER, LEFT KNEE, RIGHT WRIST  . DJD (degenerative joint disease)   . Chronic joint pain   . GERD (gastroesophageal reflux disease)   . Seasonal allergies   . 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  1997 (15 YRS AGO)    [place]  . History of head injury 2010-  CLOSED  W/ CONCUSSION--   NO RESIDUAL  . Eczema    Past Surgical History  Procedure Laterality Date  . Rotator cuff repair  01-10-2004    RIGHT  . Radioactive seed implant  09/07/2011    Procedure: RADIOACTIVE SEED IMPLANT;  Surgeon: Claybon Jabs, MD;  Location: [facility];  Service: Urology;  Laterality: N/A;  86  seeds implanted  . Cystoscopy  09/07/2011    Procedure: CYSTOSCOPY;  Surgeon: Claybon Jabs, MD;  Location: [facility];  Service: Urology;  Laterality: N/A;  no seeds noted in bladder   Family History  Problem Relation Age of Onset  . Prostate cancer Maternal Uncle     History  Substance Use Topics  . Smoking status: Former Smoker    Types: Cigarettes    Quit date: 09/02/1973  . Smokeless tobacco: Never Used     Comment: QUIT SMOKING 1970'S  . Alcohol Use: No    Review of Systems  All other systems reviewed and are negative.     Allergies  Review of patient's allergies indicates no known allergies.  Home Medications   Prior to Admission medications   Medication Sig Start Date End Date Taking? Authorizing Provider  aspirin 81 MG chewable tablet Chew 81 mg by mouth daily.   Yes Historical Provider, MD  glipiZIDE (GLUCOTROL XL) 10 MG 24 hr tablet Take 10 mg by mouth daily.   Yes Historical Provider, MD  metFORMIN (GLUCOPHAGE) 1000 MG tablet Take 1,000 mg by mouth 2 (two) times daily with a meal.   Yes Historical Provider, MD  Multiple Vitamin (MULTIVITAMIN WITH MINERALS) TABS Take 1 tablet by mouth daily.   Yes Historical Provider, MD  pregabalin (LYRICA) 50 MG capsule Take 50 mg by mouth 3 (three) times daily.   Yes Historical Provider, MD  tamsulosin (FLOMAX) 0.4 MG CAPS capsule Take 0.4 mg by mouth daily after supper.   Yes Historical Provider, MD  Vitamins A & D 5000-400 UNITS CAPS Take 5,000 Units by mouth daily. 10/05/14  Yes Historical Provider,  MD  cephALEXin (KEFLEX) 500 MG capsule Take 1 capsule (500 mg total) by mouth 4 (four) times daily. Patient not taking: Reported on 11/04/2014 09/22/14   Margarita Mail, PA-C  sulfamethoxazole-trimethoprim (BACTRIM DS,SEPTRA DS) 800-160 MG per tablet Take 1 tablet by mouth 2 (two) times daily. Patient not taking: Reported on 11/04/2014 09/22/14   Margarita Mail, PA-C   BP 143/75 mmHg  Pulse 65  Temp(Src) 98.1 F (36.7 C) (Oral)  Resp 18  SpO2 99% Physical Exam  Constitutional: He appears well-developed and well-nourished. No distress.  HENT:  Head: Normocephalic and atraumatic.  Right Ear: External ear normal.  Left Ear: External ear normal.  Eyes: Conjunctivae are normal. Right eye exhibits  no discharge. Left eye exhibits no discharge. No scleral icterus.  Neck: Neck supple. No tracheal deviation present.  Cardiovascular: Normal rate.   Pulmonary/Chest: Effort normal. No stridor. No respiratory distress.  Abdominal: He exhibits no distension. There is no tenderness. There is no rebound.  Genitourinary:  Foley catheter in place, cloudy yellow urine in the leg bag  Musculoskeletal: He exhibits no edema.  Neurological: He is alert. Cranial nerve deficit: no gross deficits.  Skin: Skin is warm and dry. No rash noted.  Psychiatric: He has a normal mood and affect.  Nursing note and vitals reviewed.   ED Course  Procedures (including critical care time) Labs Review Labs Reviewed  BASIC METABOLIC PANEL - Abnormal; Notable for the following:    CO2 20 (*)    Glucose, Bld 253 (*)    BUN 27 (*)    All other components within normal limits  CBC WITH DIFFERENTIAL/PLATELET - Abnormal; Notable for the following:    Hemoglobin 12.9 (*)    HCT 38.9 (*)    All other components within normal limits  URINE CULTURE  URINALYSIS, ROUTINE W REFLEX MICROSCOPIC (NOT AT [facility])      MDM   Pt had his foley replaced.  Will check UA.  Ideally pt will follow up with urology.    His urine frequenly has had pyuria and bacturia.  Dr Ralene Bathe will follow on the ua.    Non toxic.  No fever.   Dorie Rank, MD 11/04/14 [phone]

## 2014-11-04 NOTE — ED Notes (Signed)
MD at bedside. 

## 2014-11-04 NOTE — ED Notes (Signed)
Pt c/o increase UTI symptoms over last 5 days.Pt c/o burning around foley catheter, reports discolored urine and increased odor. Followed by [facility]

## 2014-11-04 NOTE — ED Provider Notes (Signed)
MSE was initiated and I personally evaluated the patient and placed orders (if any) at  1:09 PM on November 04, 2014.  He is here for evaluation of discomfort when he passes urine from the Foley catheter into the urine bag. He also states that the urine is cloudy. Current catheter in place for 30 days,initially placed, 2 months ago for voiding difficulty.He was unable to see his urologist today because of financial difficulties.  The patient appears stable so that the remainder of the MSE may be completed by another provider.  Daleen Bo, MD 11/04/14 1310

## 2014-11-06 LAB — URINE CULTURE

## 2014-12-08 ENCOUNTER — Encounter (HOSPITAL_COMMUNITY): Payer: Self-pay

## 2014-12-08 ENCOUNTER — Emergency Department (HOSPITAL_COMMUNITY)
Admission: EM | Admit: 2014-12-08 | Discharge: 2014-12-08 | Disposition: A | Discharge status: Home / Self Care | Payer: Medicare HMO | Attending: Emergency Medicine | Admitting: Emergency Medicine

## 2014-12-08 DIAGNOSIS — Z87891 Personal history of nicotine dependence: Secondary | ICD-10-CM | POA: Insufficient documentation

## 2014-12-08 DIAGNOSIS — Z87828 Personal history of other (healed) physical injury and trauma: Secondary | ICD-10-CM | POA: Diagnosis not present

## 2014-12-08 DIAGNOSIS — M1990 Unspecified osteoarthritis, unspecified site: Secondary | ICD-10-CM | POA: Insufficient documentation

## 2014-12-08 DIAGNOSIS — Z872 Personal history of diseases of the skin and subcutaneous tissue: Secondary | ICD-10-CM | POA: Insufficient documentation

## 2014-12-08 DIAGNOSIS — Z7982 Long term (current) use of aspirin: Secondary | ICD-10-CM | POA: Insufficient documentation

## 2014-12-08 DIAGNOSIS — Z466 Encounter for fitting and adjustment of urinary device: Secondary | ICD-10-CM

## 2014-12-08 DIAGNOSIS — G8929 Other chronic pain: Secondary | ICD-10-CM | POA: Insufficient documentation

## 2014-12-08 DIAGNOSIS — E119 Type 2 diabetes mellitus without complications: Secondary | ICD-10-CM | POA: Diagnosis not present

## 2014-12-08 DIAGNOSIS — J45909 Unspecified asthma, uncomplicated: Secondary | ICD-10-CM | POA: Diagnosis not present

## 2014-12-08 DIAGNOSIS — Z8719 Personal history of other diseases of the digestive system: Secondary | ICD-10-CM | POA: Diagnosis not present

## 2014-12-08 DIAGNOSIS — N390 Urinary tract infection, site not specified: Secondary | ICD-10-CM | POA: Diagnosis not present

## 2014-12-08 DIAGNOSIS — Z79899 Other long term (current) drug therapy: Secondary | ICD-10-CM | POA: Insufficient documentation

## 2014-12-08 DIAGNOSIS — Z87438 Personal history of other diseases of male genital organs: Secondary | ICD-10-CM | POA: Diagnosis not present

## 2014-12-08 DIAGNOSIS — Z8546 Personal history of malignant neoplasm of prostate: Secondary | ICD-10-CM | POA: Insufficient documentation

## 2014-12-08 LAB — URINALYSIS, ROUTINE W REFLEX MICROSCOPIC
Bilirubin Urine: NEGATIVE
Glucose, UA: NEGATIVE mg/dL
Ketones, ur: NEGATIVE mg/dL
Nitrite: POSITIVE — AB
PH: 7 (ref 5.0–8.0)
Protein, ur: 100 mg/dL — AB
SPECIFIC GRAVITY, URINE: 1.019 (ref 1.005–1.030)
Urobilinogen, UA: 1 mg/dL (ref 0.0–1.0)

## 2014-12-08 LAB — URINE MICROSCOPIC-ADD ON

## 2014-12-08 MED ORDER — CEPHALEXIN 500 MG PO CAPS: 500.0000 mg | ORAL_CAPSULE | Freq: Four times a day (QID) | ORAL | Status: DC

## 2014-12-08 NOTE — Discharge Instructions (Signed)
Try to follow-up with urologist. Prescription for antibiotic for urinary tract infection.

## 2014-12-08 NOTE — ED Notes (Signed)
Pt presents needing his foley catheter changed.  Sts foley was placed 7/21.  Pt attempted to follow up w/ [facility], but they would not see him due to financial reasons.

## 2014-12-08 NOTE — ED Provider Notes (Signed)
CSN: [id number]     Arrival date & time 12/08/14  1009 History   First MD Initiated Contact with Patient 12/08/14 1037     Chief Complaint  Patient presents with  . Foley Catheter Change      (Consider location/radiation/quality/duration/timing/severity/associated sxs/prior Treatment) HPI..... Patient presents with a concern for replacement of his Foley catheter. He has had his catheter for approximately 3 years. He is instructed to change it monthly. It has been approximately one month since it was changed. He is not expressing any urinary symptoms, specifically no dysuria, fever, chills, hematuria. He had been followed by urologist locally but that relationship is tenuous  Past Medical History  Diagnosis Date  . ED (erectile dysfunction)   . Mild asthma SEASONAL -  PT HAS NO INHALER  . Diabetes mellitus ORAL MEDS    TYPE II  . Prostate cancer 05/01/2011 BX    ADENOCARCINOMA,gLEASON= 3+3=6,VOLUNE=68.8CC ,PSA=7.40  . Arthritis LUMBAR BACK, SHOULDER, LEFT KNEE, RIGHT WRIST  . DJD (degenerative joint disease)   . Chronic joint pain   . GERD (gastroesophageal reflux disease)   . Seasonal allergies   . 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  1997 (15 YRS AGO)    [place]  . History of head injury 2010-  CLOSED  W/ CONCUSSION--   NO RESIDUAL  . Eczema    Past Surgical History  Procedure Laterality Date  . Rotator cuff repair  01-10-2004    RIGHT  . Radioactive seed implant  09/07/2011    Procedure: RADIOACTIVE SEED IMPLANT;  Surgeon: Claybon Jabs, MD;  Location: [facility];  Service: Urology;  Laterality: N/A;  86  seeds implanted  . Cystoscopy  09/07/2011    Procedure: CYSTOSCOPY;  Surgeon: Claybon Jabs, MD;  Location: [facility];  Service: Urology;  Laterality: N/A;  no seeds noted in bladder   Family History  Problem Relation Age of Onset  . Prostate cancer Maternal Uncle     Social History  Substance Use Topics  . Smoking status: Former Smoker    Types: Cigarettes    Quit date: 09/02/1973  . Smokeless tobacco: Never Used     Comment: QUIT SMOKING 1970'S  . Alcohol Use: No    Review of Systems  All other systems reviewed and are negative.     Allergies  Review of patient's allergies indicates no known allergies.  Home Medications   Prior to Admission medications   Medication Sig Start Date End Date Taking? Authorizing Provider  aspirin 81 MG chewable tablet Chew 81 mg by mouth daily.   Yes Historical Provider, MD  Cholecalciferol (VITAMIN D3) 5000 UNITS CAPS Take 5,000 Units by mouth daily.   Yes Historical Provider, MD  glipiZIDE (GLUCOTROL XL) 10 MG 24 hr tablet Take 10 mg by mouth daily.   Yes Historical Provider, MD  metFORMIN (GLUCOPHAGE) 1000 MG tablet Take 1,000 mg by mouth 2 (two) times daily with a meal.   Yes Historical Provider, MD  Multiple Vitamin (MULTIVITAMIN WITH MINERALS) TABS Take 1 tablet by mouth daily.   Yes Historical Provider, MD  pregabalin (LYRICA) 50 MG capsule Take 50 mg by mouth 3 (three) times daily.   Yes Historical Provider, MD  tamsulosin (FLOMAX) 0.4 MG CAPS capsule Take 0.4 mg by mouth daily after supper.   Yes Historical Provider, MD  cephALEXin (KEFLEX) 500 MG capsule Take 1 capsule (500 mg total) by mouth  4 (four) times daily. 12/08/14   Nat Christen, MD  sulfamethoxazole-trimethoprim (BACTRIM DS,SEPTRA DS) 800-160 MG per tablet Take 1 tablet by mouth 2 (two) times daily. Patient not taking: Reported on 11/04/2014 09/22/14   Margarita Mail, PA-C   BP 136/79 mmHg  Pulse 70  Temp(Src) 98.4 F (36.9 C) (Oral)  Resp 19  SpO2 98% Physical Exam  Constitutional: He is oriented to person, place, and time. He appears well-developed and well-nourished.  NAD  HENT:  Head: Normocephalic and atraumatic.  Eyes: Conjunctivae and EOM are normal. Pupils are equal, round, and reactive to light.  Neck: Normal range of motion.  Neck supple.  Cardiovascular: Normal rate and regular rhythm.   Pulmonary/Chest: Effort normal and breath sounds normal.  Abdominal: Soft. Bowel sounds are normal.  Genitourinary:  Foley catheter in place. No flank tenderness.  Musculoskeletal: Normal range of motion.  Neurological: He is alert and oriented to person, place, and time.  Skin: Skin is warm and dry.  Psychiatric: He has a normal mood and affect. His behavior is normal.  Nursing note and vitals reviewed.   ED Course  Procedures (including critical care time) Labs Review Labs Reviewed  URINALYSIS, ROUTINE W REFLEX MICROSCOPIC (NOT AT [facility]) - Abnormal; Notable for the following:    APPearance CLOUDY (*)    Hgb urine dipstick LARGE (*)    Protein, ur 100 (*)    Nitrite POSITIVE (*)    Leukocytes, UA LARGE (*)    All other components within normal limits  URINE MICROSCOPIC-ADD ON - Abnormal; Notable for the following:    Bacteria, UA FEW (*)    All other components within normal limits  URINE CULTURE    Imaging Review No results found. I have personally reviewed and evaluated these images and lab results as part of my medical decision-making.   EKG Interpretation None      MDM   Final diagnoses:  Encounter for Foley catheter replacement  UTI (lower urinary tract infection)    Patient is in no acute distress. Foley catheter placed. Urine culture. Rx Keflex for 1 week. Recommend urology follow-up    Nat Christen, MD 12/08/14 1300

## 2014-12-08 NOTE — ED Notes (Signed)
After 2nd person verification; our tech., Anneah inserts foley catheter without incident.  Pt. States he has underwent cath. changes monthly for past two years.  He states he has been catheterized ever since he was found to have 2 liters of urine in his bladder and was asymptomatic.

## 2014-12-12 LAB — URINE CULTURE: Culture: >=100000

## 2014-12-14 ENCOUNTER — Telehealth (HOSPITAL_COMMUNITY): Payer: Self-pay

## 2014-12-14 NOTE — Telephone Encounter (Signed)
Post ED Visit - Positive Culture Follow-up  Culture report reviewed by antimicrobial stewardship pharmacist: []  Wes [place], Pharm.D., BCPS []  Heide Guile, Pharm.D., BCPS []  Alycia Rossetti, Pharm.D., BCPS []  [place], Pharm.D., BCPS, AAHIVP []  Legrand Como, Pharm.D., BCPS, AAHIVP []  Isac Sarna, Pharm.D., BCPS X  T. Stone, Pharm.D.  Positive Urine culture, 100,000 colonies -> E Coli(txd w/Cephalexin->STS)  80, 000 colonies -> MRSA Chart reviewed by T. Carlota Raspberry PA "No treatment"   Dortha Kern 12/14/2014, 5:54 AM

## 2015-04-20 ENCOUNTER — Emergency Department (HOSPITAL_COMMUNITY)
Admission: EM | Admit: 2015-04-20 | Discharge: 2015-04-20 | Disposition: A | Discharge status: Home / Self Care | Payer: Medicare HMO | Attending: Emergency Medicine | Admitting: Emergency Medicine

## 2015-04-20 ENCOUNTER — Encounter (HOSPITAL_COMMUNITY): Payer: Self-pay | Admitting: Emergency Medicine

## 2015-04-20 DIAGNOSIS — E119 Type 2 diabetes mellitus without complications: Secondary | ICD-10-CM | POA: Insufficient documentation

## 2015-04-20 DIAGNOSIS — Z466 Encounter for fitting and adjustment of urinary device: Secondary | ICD-10-CM | POA: Diagnosis not present

## 2015-04-20 DIAGNOSIS — Z7984 Long term (current) use of oral hypoglycemic drugs: Secondary | ICD-10-CM | POA: Insufficient documentation

## 2015-04-20 DIAGNOSIS — M1990 Unspecified osteoarthritis, unspecified site: Secondary | ICD-10-CM | POA: Insufficient documentation

## 2015-04-20 DIAGNOSIS — G8929 Other chronic pain: Secondary | ICD-10-CM | POA: Insufficient documentation

## 2015-04-20 DIAGNOSIS — Z8546 Personal history of malignant neoplasm of prostate: Secondary | ICD-10-CM | POA: Diagnosis not present

## 2015-04-20 DIAGNOSIS — Y658 Other specified misadventures during surgical and medical care: Secondary | ICD-10-CM | POA: Insufficient documentation

## 2015-04-20 DIAGNOSIS — Z87438 Personal history of other diseases of male genital organs: Secondary | ICD-10-CM | POA: Diagnosis not present

## 2015-04-20 DIAGNOSIS — Z872 Personal history of diseases of the skin and subcutaneous tissue: Secondary | ICD-10-CM | POA: Diagnosis not present

## 2015-04-20 DIAGNOSIS — Z79899 Other long term (current) drug therapy: Secondary | ICD-10-CM | POA: Diagnosis not present

## 2015-04-20 DIAGNOSIS — N390 Urinary tract infection, site not specified: Secondary | ICD-10-CM | POA: Insufficient documentation

## 2015-04-20 DIAGNOSIS — T83098A Other mechanical complication of other indwelling urethral catheter, initial encounter: Secondary | ICD-10-CM | POA: Insufficient documentation

## 2015-04-20 DIAGNOSIS — Z87828 Personal history of other (healed) physical injury and trauma: Secondary | ICD-10-CM | POA: Diagnosis not present

## 2015-04-20 DIAGNOSIS — Z8719 Personal history of other diseases of the digestive system: Secondary | ICD-10-CM | POA: Diagnosis not present

## 2015-04-20 DIAGNOSIS — Z7982 Long term (current) use of aspirin: Secondary | ICD-10-CM | POA: Insufficient documentation

## 2015-04-20 DIAGNOSIS — T83511A Infection and inflammatory reaction due to indwelling urethral catheter, initial encounter: Secondary | ICD-10-CM

## 2015-04-20 DIAGNOSIS — J45909 Unspecified asthma, uncomplicated: Secondary | ICD-10-CM | POA: Diagnosis not present

## 2015-04-20 DIAGNOSIS — Z87891 Personal history of nicotine dependence: Secondary | ICD-10-CM | POA: Insufficient documentation

## 2015-04-20 LAB — URINALYSIS, ROUTINE W REFLEX MICROSCOPIC
Bilirubin Urine: NEGATIVE
Ketones, ur: 15 mg/dL — AB
Nitrite: POSITIVE — AB
PROTEIN: 100 mg/dL — AB
Specific Gravity, Urine: 1.03 (ref 1.005–1.030)
pH: 5.5 (ref 5.0–8.0)

## 2015-04-20 LAB — URINE MICROSCOPIC-ADD ON

## 2015-04-20 MED ORDER — SULFAMETHOXAZOLE-TRIMETHOPRIM 800-160 MG PO TABS: 1.0000 | ORAL_TABLET | Freq: Two times a day (BID) | ORAL | Status: AC

## 2015-04-20 NOTE — ED Notes (Signed)
Pt states he needs his foley catheter replaced because it's been in for two months. Could not be seen at Urology d/t money problems. Not having any problems with the catheter or pain. Says "I know it just needs to be changed every 30 days, and now we're up to 60"

## 2015-04-20 NOTE — Discharge Instructions (Signed)
Schedule a follow up appointment with [facility].   Urinary Tract Infection Urinary tract infections (UTIs) can develop anywhere along your urinary tract. Your urinary tract is your body's drainage system for removing wastes and extra water. Your urinary tract includes two kidneys, two ureters, a bladder, and a urethra. Your kidneys are a pair of bean-shaped organs. Each kidney is about the size of your fist. They are located below your ribs, one on each side of your spine. CAUSES Infections are caused by microbes, which are microscopic organisms, including fungi, viruses, and bacteria. These organisms are so small that they can only be seen through a microscope. Bacteria are the microbes that most commonly cause UTIs. SYMPTOMS  Symptoms of UTIs may vary by age and gender of the patient and by the location of the infection. Symptoms in young women typically include a frequent and intense urge to urinate and a painful, burning feeling in the bladder or urethra during urination. Older women and men are more likely to be tired, shaky, and weak and have muscle aches and abdominal pain. A fever may mean the infection is in your kidneys. Other symptoms of a kidney infection include pain in your back or sides below the ribs, nausea, and vomiting. DIAGNOSIS To diagnose a UTI, your caregiver will ask you about your symptoms. Your caregiver will also ask you to provide a urine sample. The urine sample will be tested for bacteria and white blood cells. White blood cells are made by your body to help fight infection. TREATMENT  Typically, UTIs can be treated with medication. Because most UTIs are caused by a bacterial infection, they usually can be treated with the use of antibiotics. The choice of antibiotic and length of treatment depend on your symptoms and the type of bacteria causing your infection. HOME CARE INSTRUCTIONS  If you were prescribed antibiotics, take them exactly as your caregiver  instructs you. Finish the medication even if you feel better after you have only taken some of the medication.  Drink enough water and fluids to keep your urine clear or pale yellow.  Avoid caffeine, tea, and carbonated beverages. They tend to irritate your bladder.  Empty your bladder often. Avoid holding urine for long periods of time.  Empty your bladder before and after sexual intercourse.  After a bowel movement, women should cleanse from front to back. Use each tissue only once. SEEK MEDICAL CARE IF:   You have back pain.  You develop a fever.  Your symptoms do not begin to resolve within 3 days. SEEK IMMEDIATE MEDICAL CARE IF:   You have severe back pain or lower abdominal pain.  You develop chills.  You have nausea or vomiting.  You have continued burning or discomfort with urination. MAKE SURE YOU:   Understand these instructions.  Will watch your condition.  Will get help right away if you are not doing well or get worse.   This information is not intended to replace advice given to you by your health care provider. Make sure you discuss any questions you have with your health care provider.   Document Released: 01/10/2005 Document Revised: 12/22/2014 Document Reviewed: 05/11/2011 Elsevier Interactive Patient Education 2016 [place] Urinary Tract Infection FAQs What is "catheter-associated urinary tract infection"? A urinary tract infection (also called "UTI") is an infection in the urinary system, which includes the bladder (which stores the urine) and the kidneys (which filter the blood to make urine). Germs (for example, bacteria or yeasts) do  not normally live in these areas; but if germs are introduced, an infection can occur. If you have a urinary catheter, germs can travel along the catheter and cause an infection in your bladder or your kidney; in that case it is called a catheter-associated urinary tract infection (or "CA-UTI").   What is a urinary catheter? A urinary catheter is a thin tube placed in the bladder to drain urine. Urine drains through the tube into a bag that collects the urine. A urinary catheter may be used:  If you are not able to urinate on your own  To measure the amount of urine that you make, for example, during intensive care  During and after some types of surgery  During some tests of the kidneys and bladder People with urinary catheters have a much higher chance of getting a urinary tract infection than people who don't have a catheter. How do I get a catheter-associated urinary tract infection (CA-UTI)? If germs enter the urinary tract, they may cause an infection. Many of the germs that cause a catheter-associated urinary tract infection are common germs found in your intestines that do not usually cause an infection there. Germs can enter the urinary tract when the catheter is being put in or while the catheter remains in the bladder.  What are the symptoms of a urinary tract infection? Some of the common symptoms of a urinary tract infection are:  Burning or pain in the lower abdomen (that is, below the stomach)  Fever  Bloody urine may be a sign of infection, but is also caused by other problems  Burning during urination or an increase in the frequency of urination after the catheter is removed. Sometimes people with catheter-associated urinary tract infections do not have these symptoms of infection. Can catheter-associated urinary tract infections be treated? Yes, most catheter-associated urinary tract infections can be treated with antibiotics and removal or change of the catheter. Your doctor will determine which antibiotic is best for you.  What are some of the things that hospitals are doing to prevent catheter-associated urinary tract infections? To prevent urinary tract infections, doctors and nurses take the following actions.  Catheter insertion  External catheters in  men (these look like condoms and are placed over the penis rather than into the penis)  Putting a temporary catheter in to drain the urine and removing it right away. This is called intermittent urethral catheterization. Catheter care What can I do to help prevent catheter-associated urinary tract infections if I have a catheter?  Always clean your hands before and after doing catheter care.  Always keep your urine bag below the level of your bladder.  Do not tug or pull on the tubing.  Do not twist or kink the catheter tubing.  Ask your healthcare provider each day if you still need the catheter. What do I need to do when I go home from the hospital?  If you will be going home with a catheter, your doctor or nurse should explain everything you need to know about taking care of the catheter. Make sure you understand how to care for it before you leave the hospital.  If you develop any of the symptoms of a urinary tract infection, such as burning or pain in the lower abdomen, fever, or an increase in the frequency of urination, contact your doctor or nurse immediately.  Before you go home, make sure you know who to contact if you have questions or problems after you get home. If  you have questions, please ask your doctor or nurse. Developed and co-sponsored by [employer] for [place] [phone]); Infectious Diseases Society of [place] (IDSA); [place]; Association for Professionals in Infection Control and Epidemiology (APIC); Centers for Disease Control and Prevention (CDC); and [employer].   This information is not intended to replace advice given to you by your health care provider. Make sure you discuss any questions you have with your health care provider.   Document Released: 12/26/2011 Document Revised: 08/17/2014 Document Reviewed: 06/16/2014 Elsevier Interactive Patient Education [employer].

## 2015-04-20 NOTE — ED Provider Notes (Signed)
CSN: [id number]     Arrival date & time 04/20/15  1146 History   First MD Initiated Contact with Patient 04/20/15 1605     Chief Complaint  Patient presents with  . Foley replaced    HPI  Cole Conrad is a 65 year old male with PMHx of prostate cancer presenting for foley catheter change. Pt is followed by [facility] but reports difficulty with affording follow up visits. He went to [facility] today but could not afford the co-pay. He has a chronic indwelling foley catheter that is supposed to be changed every 30 days. His current catheter has been in for approximately 2 months. Denies fevers, chills, abdominal pain, dysuria, hematuria or flank pain. He reports no issues with the catheter. He has no complaints today.   Past Medical History  Diagnosis Date  . ED (erectile dysfunction)   . Mild asthma SEASONAL -  PT HAS NO INHALER  . Diabetes mellitus ORAL MEDS    TYPE II  . Prostate cancer ([place]) 05/01/2011 BX    ADENOCARCINOMA,gLEASON= 3+3=6,VOLUNE=68.8CC ,PSA=7.40  . Arthritis LUMBAR BACK, SHOULDER, LEFT KNEE, RIGHT WRIST  . DJD (degenerative joint disease)   . Chronic joint pain   . GERD (gastroesophageal reflux disease)   . Seasonal allergies   . 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  1997 (15 YRS AGO)    [place]  . History of head injury 2010-  CLOSED  W/ CONCUSSION--   NO RESIDUAL  . Eczema    Past Surgical History  Procedure Laterality Date  . Rotator cuff repair  01-10-2004    RIGHT  . Radioactive seed implant  09/07/2011    Procedure: RADIOACTIVE SEED IMPLANT;  Surgeon: Claybon Jabs, MD;  Location: [facility];  Service: Urology;  Laterality: N/A;  86  seeds implanted  . Cystoscopy  09/07/2011    Procedure: CYSTOSCOPY;  Surgeon: Claybon Jabs, MD;  Location: [facility];  Service: Urology;  Laterality: N/A;  no seeds noted in bladder   Family History  Problem Relation Age of  Onset  . Prostate cancer Maternal Uncle    Social History  Substance Use Topics  . Smoking status: Former Smoker    Types: Cigarettes    Quit date: 09/02/1973  . Smokeless tobacco: Never Used     Comment: QUIT SMOKING 1970'S  . Alcohol Use: No    Review of Systems  Constitutional: Negative for fever and chills.  Gastrointestinal: Negative for nausea, vomiting and abdominal pain.  Genitourinary: Negative for dysuria, hematuria, flank pain, decreased urine volume, discharge, scrotal swelling, penile pain and testicular pain.  All other systems reviewed and are negative.     Allergies  Review of patient's allergies indicates no known allergies.  Home Medications   Prior to Admission medications   Medication Sig Start Date End Date Taking? Authorizing Provider  aspirin 81 MG chewable tablet Chew 81 mg by mouth daily.   Yes Historical Provider, MD  Cholecalciferol (VITAMIN D3) 5000 UNITS CAPS Take 5,000 Units by mouth daily.   Yes Historical Provider, MD  glipiZIDE (GLUCOTROL XL) 10 MG 24 hr tablet Take 10 mg by mouth daily.   Yes Historical Provider, MD  metFORMIN (GLUCOPHAGE) 1000 MG tablet Take 1,000 mg by mouth 2 (two) times daily with a meal.   Yes Historical Provider, MD  Multiple Vitamin (MULTIVITAMIN WITH MINERALS) TABS Take 1 tablet by mouth daily.  Yes Historical Provider, MD  Pseudoephedrine-Ibuprofen (IBUPROFEN AND PSE COLD & SINUS) 30-200 MG TABS Take 1 tablet by mouth daily as needed (cold symptoms).   Yes Historical Provider, MD  tamsulosin (FLOMAX) 0.4 MG CAPS capsule Take 0.4 mg by mouth daily after supper.   Yes Historical Provider, MD  cephALEXin (KEFLEX) 500 MG capsule Take 1 capsule (500 mg total) by mouth 4 (four) times daily. Patient not taking: Reported on 04/20/2015 12/08/14   Nat Christen, MD  pregabalin (LYRICA) 50 MG capsule Take 50 mg by mouth 3 (three) times daily.    Historical Provider, MD  sulfamethoxazole-trimethoprim (BACTRIM DS,SEPTRA DS) 800-160 MG  per tablet Take 1 tablet by mouth 2 (two) times daily. Patient not taking: Reported on 11/04/2014 09/22/14   Margarita Mail, PA-C  sulfamethoxazole-trimethoprim (BACTRIM DS,SEPTRA DS) 800-160 MG tablet Take 1 tablet by mouth 2 (two) times daily. 04/20/15 04/27/15  Alexus Michael, PA-C   BP 125/78 mmHg  Pulse 69  Temp(Src) 98.1 F (36.7 C) (Oral)  Resp 20  SpO2 98% Physical Exam  Constitutional: He appears well-developed and well-nourished. No distress.  HENT:  Head: Normocephalic and atraumatic.  Eyes: Conjunctivae are normal. Right eye exhibits no discharge. Left eye exhibits no discharge. No scleral icterus.  Neck: Normal range of motion.  Cardiovascular: Normal rate and regular rhythm.   Pulmonary/Chest: Effort normal. No respiratory distress.  Abdominal: Soft. There is no tenderness. There is no rebound and no guarding.  No CVA tenderness  Genitourinary:  Foley catheter in place draining well without gross hematuria  Musculoskeletal: Normal range of motion.  Neurological: He is alert. Coordination normal.  Skin: Skin is warm and dry.  Psychiatric: He has a normal mood and affect. His behavior is normal.  Nursing note and vitals reviewed.   ED Course  Procedures (including critical care time) Labs Review Labs Reviewed  URINALYSIS, ROUTINE W REFLEX MICROSCOPIC (NOT AT [facility]) - Abnormal; Notable for the following:    APPearance TURBID (*)    Glucose, UA >1000 (*)    Hgb urine dipstick LARGE (*)    Ketones, ur 15 (*)    Protein, ur 100 (*)    Nitrite POSITIVE (*)    Leukocytes, UA LARGE (*)    All other components within normal limits  URINE MICROSCOPIC-ADD ON - Abnormal; Notable for the following:    Squamous Epithelial / LPF 0-5 (*)    Bacteria, UA MANY (*)    All other components within normal limits  URINE CULTURE    Imaging Review No results found. I have personally reviewed and evaluated these images and lab results as part of my medical decision-making.   EKG  Interpretation None      MDM   Final diagnoses:  Encounter for Foley catheter replacement  Urinary tract infection associated with catheterization of urinary tract, initial encounter   65 year old male presenting for catheter change. He is supposed to get his foley changed every month but hasn't had his current catheter changed in around 2 months. He follows with [facility] but has had issues affording copays. He has no complaints today. VSS. Abdomen is soft, non-tender without peritoneal signs. No CVA tenderness. Catheter draining well without gross hematuria. Catheter changed in ED. UA positive for UTI. Will tx with abx and culture urine. Encouraged pt to follow up with [facility].     Lahoma Crocker Jaeson Molstad, PA-C 04/20/15 1841  Lacretia Leigh, MD 04/21/15 1536

## 2015-04-22 LAB — URINE CULTURE

## 2015-06-10 ENCOUNTER — Encounter (HOSPITAL_COMMUNITY): Payer: Self-pay

## 2015-06-10 ENCOUNTER — Emergency Department (HOSPITAL_COMMUNITY)
Admission: EM | Admit: 2015-06-10 | Discharge: 2015-06-10 | Disposition: A | Discharge status: Home / Self Care | Payer: Medicare HMO | Attending: Emergency Medicine | Admitting: Emergency Medicine

## 2015-06-10 DIAGNOSIS — G8929 Other chronic pain: Secondary | ICD-10-CM | POA: Diagnosis not present

## 2015-06-10 DIAGNOSIS — Y658 Other specified misadventures during surgical and medical care: Secondary | ICD-10-CM | POA: Insufficient documentation

## 2015-06-10 DIAGNOSIS — Z87891 Personal history of nicotine dependence: Secondary | ICD-10-CM | POA: Insufficient documentation

## 2015-06-10 DIAGNOSIS — Z8546 Personal history of malignant neoplasm of prostate: Secondary | ICD-10-CM | POA: Insufficient documentation

## 2015-06-10 DIAGNOSIS — Z8719 Personal history of other diseases of the digestive system: Secondary | ICD-10-CM | POA: Diagnosis not present

## 2015-06-10 DIAGNOSIS — M1990 Unspecified osteoarthritis, unspecified site: Secondary | ICD-10-CM | POA: Diagnosis not present

## 2015-06-10 DIAGNOSIS — J45909 Unspecified asthma, uncomplicated: Secondary | ICD-10-CM | POA: Diagnosis not present

## 2015-06-10 DIAGNOSIS — Z872 Personal history of diseases of the skin and subcutaneous tissue: Secondary | ICD-10-CM | POA: Diagnosis not present

## 2015-06-10 DIAGNOSIS — E119 Type 2 diabetes mellitus without complications: Secondary | ICD-10-CM | POA: Diagnosis not present

## 2015-06-10 DIAGNOSIS — R319 Hematuria, unspecified: Secondary | ICD-10-CM | POA: Insufficient documentation

## 2015-06-10 DIAGNOSIS — Z7982 Long term (current) use of aspirin: Secondary | ICD-10-CM | POA: Diagnosis not present

## 2015-06-10 DIAGNOSIS — Z87828 Personal history of other (healed) physical injury and trauma: Secondary | ICD-10-CM | POA: Insufficient documentation

## 2015-06-10 DIAGNOSIS — T839XXA Unspecified complication of genitourinary prosthetic device, implant and graft, initial encounter: Secondary | ICD-10-CM

## 2015-06-10 DIAGNOSIS — Z79899 Other long term (current) drug therapy: Secondary | ICD-10-CM | POA: Insufficient documentation

## 2015-06-10 DIAGNOSIS — Z7984 Long term (current) use of oral hypoglycemic drugs: Secondary | ICD-10-CM | POA: Diagnosis not present

## 2015-06-10 DIAGNOSIS — Z87438 Personal history of other diseases of male genital organs: Secondary | ICD-10-CM | POA: Diagnosis not present

## 2015-06-10 DIAGNOSIS — T83098A Other mechanical complication of other indwelling urethral catheter, initial encounter: Secondary | ICD-10-CM | POA: Insufficient documentation

## 2015-06-10 LAB — URINALYSIS, ROUTINE W REFLEX MICROSCOPIC
Bilirubin Urine: NEGATIVE
GLUCOSE, UA: NEGATIVE mg/dL
KETONES UR: NEGATIVE mg/dL
NITRITE: POSITIVE — AB
PROTEIN: 100 mg/dL — AB
Specific Gravity, Urine: 1.018 (ref 1.005–1.030)
pH: 5.5 (ref 5.0–8.0)

## 2015-06-10 LAB — CBC WITH DIFFERENTIAL/PLATELET
BASOS PCT: 0 %
Basophils Absolute: 0 10*3/uL (ref 0.0–0.1)
Eosinophils Absolute: 0.3 10*3/uL (ref 0.0–0.7)
Eosinophils Relative: 3 %
HEMATOCRIT: 38.2 % — AB (ref 39.0–52.0)
HEMOGLOBIN: 12.9 g/dL — AB (ref 13.0–17.0)
Lymphocytes Relative: 25 %
Lymphs Abs: 2.1 10*3/uL (ref 0.7–4.0)
MCH: 30.3 pg (ref 26.0–34.0)
MCHC: 33.8 g/dL (ref 30.0–36.0)
MCV: 89.7 fL (ref 78.0–100.0)
MONO ABS: 0.7 10*3/uL (ref 0.1–1.0)
Monocytes Relative: 9 %
NEUTROS ABS: 5.1 10*3/uL (ref 1.7–7.7)
Neutrophils Relative %: 63 %
Platelets: 268 10*3/uL (ref 150–400)
RBC: 4.26 MIL/uL (ref 4.22–5.81)
RDW: 13.8 % (ref 11.5–15.5)
WBC: 8.1 10*3/uL (ref 4.0–10.5)

## 2015-06-10 LAB — BASIC METABOLIC PANEL
ANION GAP: 11 (ref 5–15)
BUN: 23 mg/dL — ABNORMAL HIGH (ref 6–20)
CALCIUM: 9.5 mg/dL (ref 8.9–10.3)
CO2: 20 mmol/L — ABNORMAL LOW (ref 22–32)
Chloride: 107 mmol/L (ref 101–111)
Creatinine, Ser: 1.63 mg/dL — ABNORMAL HIGH (ref 0.61–1.24)
GFR calc Af Amer: 51 mL/min — ABNORMAL LOW (ref >60)
GFR, EST NON AFRICAN AMERICAN: 44 mL/min — AB (ref >60)
GLUCOSE: 191 mg/dL — AB (ref 65–99)
Potassium: 5.1 mmol/L (ref 3.5–5.1)
Sodium: 138 mmol/L (ref 135–145)

## 2015-06-10 LAB — URINE MICROSCOPIC-ADD ON

## 2015-06-10 MED ORDER — SODIUM CHLORIDE 0.9 % IV BOLUS (SEPSIS)
1000.0000 mL | Freq: Once | INTRAVENOUS | Status: AC
  Administered 2015-06-10: 1000 mL via INTRAVENOUS

## 2015-06-10 MED ORDER — DEXTROSE 5 % IV SOLN
1.0000 g | Freq: Once | INTRAVENOUS | Status: AC
  Administered 2015-06-10: 1 g via INTRAVENOUS
  Filled 2015-06-10: qty 10

## 2015-06-10 NOTE — Discharge Instructions (Signed)
I suspect your superpubic abdominal pain is likely from bladder spasm. Your urine is chronically showing urinary tract infections. Your urine was sent for culture today. Please follow-up with your urologist that they can follow-up on test results.   Hematuria, Adult Hematuria is blood in your urine. It can be caused by a bladder infection, kidney infection, prostate infection, kidney stone, or cancer of your urinary tract. Infections can usually be treated with medicine, and a kidney stone usually will pass through your urine. If neither of these is the cause of your hematuria, further workup to find out the reason may be needed. It is very important that you tell your health care provider about any blood you see in your urine, even if the blood stops without treatment or happens without causing pain. Blood in your urine that happens and then stops and then happens again can be a symptom of a very serious condition. Also, pain is not a symptom in the initial stages of many urinary cancers. HOME CARE INSTRUCTIONS   Drink lots of fluid, 3-4 quarts a day. If you have been diagnosed with an infection, cranberry juice is especially recommended, in addition to large amounts of water.  Avoid caffeine, tea, and carbonated beverages because they tend to irritate the bladder.  Avoid alcohol because it may irritate the prostate.  Take all medicines as directed by your health care provider.  If you were prescribed an antibiotic medicine, finish it all even if you start to feel better.  If you have been diagnosed with a kidney stone, follow your health care provider's instructions regarding straining your urine to catch the stone.  Empty your bladder often. Avoid holding urine for long periods of time.  After a bowel movement, women should cleanse front to back. Use each tissue only once.  Empty your bladder before and after sexual intercourse if you are a male. SEEK MEDICAL CARE IF: 1. You develop  back pain. 2. You have a fever. 3. You have a feeling of sickness in your stomach (nausea) or vomiting. 4. Your symptoms are not better in 3 days. Return sooner if you are getting worse. SEEK IMMEDIATE MEDICAL CARE IF:  1. You develop severe vomiting and are unable to keep the medicine down. 2. You develop severe back or abdominal pain despite taking your medicines. 3. You begin passing a large amount of blood or clots in your urine. 4. You feel extremely weak or faint, or you pass out. MAKE SURE YOU:  1. Understand these instructions. 2. Will watch your condition. 3. Will get help right away if you are not doing well or get worse.   This information is not intended to replace advice given to you by your health care provider. Make sure you discuss any questions you have with your health care provider.   Document Released: 04/02/2005 Document Revised: 04/23/2014 Document Reviewed: 12/01/2012 Elsevier Interactive Patient Education 2016 [place], Adult A Foley catheter is a soft, flexible tube that is placed into the bladder to drain urine. A Foley catheter may be inserted if:  You leak urine or are not able to control when you urinate (urinary incontinence).  You are not able to urinate when you need to (urinary retention).  You had prostate surgery or surgery on the genitals.  You have certain medical conditions, such as multiple sclerosis, dementia, or a spinal cord injury. If you are going home with a Foley catheter in place, follow the instructions below. TAKING CARE  OF THE CATHETER 5. Wash your hands with soap and water. 6. Using mild soap and warm water on a clean washcloth:  Clean the area on your body closest to the catheter insertion site using a circular motion, moving away from the catheter. Never wipe toward the catheter because this could sweep bacteria up into the urethra and cause infection.  Remove all traces of soap. Pat the area dry with a  clean towel. For males, reposition the foreskin. 7. Attach the catheter to your leg so there is no tension on the catheter. Use adhesive tape or a leg strap. If you are using adhesive tape, remove any sticky residue left behind by the previous tape you used. 8. Keep the drainage bag below the level of the bladder, but keep it off the floor. 9. Check throughout the day to be sure the catheter is working and urine is draining freely. Make sure the tubing does not become kinked. 10. Do not pull on the catheter or try to remove it. Pulling could damage internal tissues. TAKING CARE OF THE DRAINAGE BAGS You will be given two drainage bags to take home. One is a large overnight drainage bag, and the other is a smaller leg bag that fits underneath clothing. You may wear the overnight bag at any time, but you should never wear the smaller leg bag at night. Follow the instructions below for how to empty, change, and clean your drainage bags. Emptying the Drainage Bag You must empty your drainage bag when it is  - full or at least 2-3 times a day. 5. Wash your hands with soap and water. 6. Keep the drainage bag below your hips, below the level of your bladder. This stops urine from going back into the tubing and into your bladder. 7. Hold the dirty bag over the toilet or a clean container. 8. Open the pour spout at the bottom of the bag and empty the urine into the toilet or container. Do not let the pour spout touch the toilet, container, or any other surface. Doing so can place bacteria on the bag, which can cause an infection. 9. Clean the pour spout with a gauze pad or cotton ball that has rubbing alcohol on it. 10. Close the pour spout. 11. Attach the bag to your leg with adhesive tape or a leg strap. 12. Wash your hands well. Changing the Drainage Bag Change your drainage bag once a month or sooner if it starts to smell bad or look dirty. Below are steps to follow when changing the drainage  bag. 4. Wash your hands with soap and water. 5. Pinch off the rubber catheter so that urine does not spill out. 6. Disconnect the catheter tube from the drainage tube at the connection valve. Do not let the tubes touch any surface. 7. Clean the end of the catheter tube with an alcohol wipe. Use a different alcohol wipe to clean the end of the drainage tube. 8. Connect the catheter tube to the drainage tube of the clean drainage bag. 9. Attach the new bag to the leg with adhesive tape or a leg strap. Avoid attaching the new bag too tightly. 10. Wash your hands well. Cleaning the Drainage Bag 1. Wash your hands with soap and water. 2. Wash the bag in warm, soapy water. 3. Rinse the bag thoroughly with warm water. 4. Fill the bag with a solution of white vinegar and water (1 cup vinegar to 1 qt warm water [.2 L vinegar  to 1 L warm water]). Close the bag and soak it for 30 minutes in the solution. 5. Rinse the bag with warm water. 6. Hang the bag to dry with the pour spout open and hanging downward. 7. Store the clean bag (once it is dry) in a clean plastic bag. 8. Wash your hands well. PREVENTING INFECTION  Wash your hands before and after handling your catheter.  Take showers daily and wash the area where the catheter enters your body. Do not take baths. Replace wet leg straps with dry ones, if this applies.  Do not use powders, sprays, or lotions on the genital area. Only use creams, lotions, or ointments as directed by your caregiver.  For females, wipe from front to back after each bowel movement.  Drink enough fluids to keep your urine clear or pale yellow unless you have a fluid restriction.  Do not let the drainage bag or tubing touch or lie on the floor.  Wear cotton underwear to absorb moisture and to keep your skin drier. SEEK MEDICAL CARE IF:   Your urine is cloudy or smells unusually bad.  Your catheter becomes clogged.  You are not draining urine into the bag or your  bladder feels full.  Your catheter starts to leak. SEEK IMMEDIATE MEDICAL CARE IF:   You have pain, swelling, redness, or pus where the catheter enters the body.  You have pain in the abdomen, legs, lower back, or bladder.  You have a fever.  You see blood fill the catheter, or your urine is pink or red.  You have nausea, vomiting, or chills.  Your catheter gets pulled out. MAKE SURE YOU:   Understand these instructions.  Will watch your condition.  Will get help right away if you are not doing well or get worse.   This information is not intended to replace advice given to you by your health care provider. Make sure you discuss any questions you have with your health care provider.   Document Released: 04/02/2005 Document Revised: 08/17/2013 Document Reviewed: 03/24/2012 Elsevier Interactive Patient Education [employer].

## 2015-06-10 NOTE — ED Notes (Signed)
Pt states he has foley cath.  Pt has had cath for 4 years.  Pt noted blood in urine x 1 week ago.  Pt also having bleeding from penis and groin pain.

## 2015-06-10 NOTE — ED Provider Notes (Signed)
CSN: [id number]     Arrival date & time 06/10/15  1130 History   First MD Initiated Contact with Patient 06/10/15 1459     Chief Complaint  Patient presents with  . Hematuria   Cole Conrad is a 65 y.o. male with a history of prostate cancer and chronic foley catheter who presents to the ED complaining of intermittent blood from the tip of the penis since last week. He reports it was then resolved. Then returned again yesterday. He reports sensitivity to the tip of his penis. He denies any blood in his urine. He denies any testicle pain or swelling. He reports he is followed by urologist Dr. Loel Lofty. He last had his Foley cath changed 6 weeks ago. He also reports intermittent suprapubic pain. He denies current abdominal pain. He denies fevers, nausea, vomiting, diarrhea, current abdominal pain, or rashes.   Patient is a 65 y.o. male presenting with hematuria. The history is provided by the patient. No language interpreter was used.  Hematuria Pertinent negatives include no abdominal pain, chest pain, chills, congestion, coughing, fever, headaches, nausea, neck pain, rash, sore throat or vomiting.    Past Medical History  Diagnosis Date  . ED (erectile dysfunction)   . Mild asthma SEASONAL -  PT HAS NO INHALER  . Diabetes mellitus ORAL MEDS    TYPE II  . Prostate cancer ([place]) 05/01/2011 BX    ADENOCARCINOMA,gLEASON= 3+3=6,VOLUNE=68.8CC ,PSA=7.40  . Arthritis LUMBAR BACK, SHOULDER, LEFT KNEE, RIGHT WRIST  . DJD (degenerative joint disease)   . Chronic joint pain   . GERD (gastroesophageal reflux disease)   . Seasonal allergies   . 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  1997 (15 YRS AGO)    [place]  . History of head injury 2010-  CLOSED  W/ CONCUSSION--   NO RESIDUAL  . Eczema    Past Surgical History  Procedure Laterality Date  . Rotator cuff repair  01-10-2004    RIGHT  . Radioactive seed implant  09/07/2011     Procedure: RADIOACTIVE SEED IMPLANT;  Surgeon: Claybon Jabs, MD;  Location: [facility];  Service: Urology;  Laterality: N/A;  86  seeds implanted  . Cystoscopy  09/07/2011    Procedure: CYSTOSCOPY;  Surgeon: Claybon Jabs, MD;  Location: [facility];  Service: Urology;  Laterality: N/A;  no seeds noted in bladder   Family History  Problem Relation Age of Onset  . Prostate cancer Maternal Uncle    Social History  Substance Use Topics  . Smoking status: Former Smoker    Types: Cigarettes    Quit date: 09/02/1973  . Smokeless tobacco: Never Used     Comment: QUIT SMOKING 1970'S  . Alcohol Use: No    Review of Systems  Constitutional: Negative for fever and chills.  HENT: Negative for congestion and sore throat.   Eyes: Negative for visual disturbance.  Respiratory: Negative for cough and shortness of breath.   Cardiovascular: Negative for chest pain.  Gastrointestinal: Negative for nausea, vomiting, abdominal pain, diarrhea and blood in stool.  Genitourinary: Positive for hematuria, discharge and penile pain. Negative for dysuria, frequency, flank pain, decreased urine volume, penile swelling, scrotal swelling, genital sores and testicular pain.  Musculoskeletal: Negative for back pain and neck pain.  Skin: Negative for rash.  Neurological: Negative for headaches.      Allergies  Review of patient's allergies indicates no known allergies.  Home Medications   Prior to Admission medications   Medication Sig Start Date End Date Taking? Authorizing Provider  aspirin 81 MG chewable tablet Chew 81 mg by mouth daily.   Yes Historical Provider, MD  Cholecalciferol (VITAMIN D3) 5000 UNITS CAPS Take 5,000 Units by mouth daily.   Yes Historical Provider, MD  glipiZIDE (GLUCOTROL XL) 10 MG 24 hr tablet Take 10 mg by mouth daily.   Yes Historical Provider, MD  metFORMIN (GLUCOPHAGE) 1000 MG tablet Take 1,000 mg by mouth 2 (two) times daily with a meal.    Yes Historical Provider, MD  Multiple Vitamin (MULTIVITAMIN WITH MINERALS) TABS Take 1 tablet by mouth daily.   Yes Historical Provider, MD  tamsulosin (FLOMAX) 0.4 MG CAPS capsule Take 0.4 mg by mouth daily after supper.   Yes Historical Provider, MD  cephALEXin (KEFLEX) 500 MG capsule Take 1 capsule (500 mg total) by mouth 4 (four) times daily. Patient not taking: Reported on 04/20/2015 12/08/14   Nat Christen, MD   BP 122/85 mmHg  Pulse 74  Temp(Src) 98.2 F (36.8 C) (Oral)  Resp 16  SpO2 94% Physical Exam  Constitutional: He appears well-developed and well-nourished. No distress.  Nontoxic appearing.  HENT:  Head: Normocephalic and atraumatic.  Mouth/Throat: Oropharynx is clear and moist.  Eyes: Conjunctivae are normal. Pupils are equal, round, and reactive to light. Right eye exhibits no discharge. Left eye exhibits no discharge.  Neck: Neck supple.  Cardiovascular: Normal rate, regular rhythm, normal heart sounds and intact distal pulses.  Exam reveals no gallop and no friction rub.   No murmur heard. Pulmonary/Chest: Effort normal and breath sounds normal. No respiratory distress. He has no wheezes. He has no rales.  Abdominal: Soft. Bowel sounds are normal. He exhibits no distension. There is no tenderness. There is no rebound and no guarding.  Abdomen is soft and nontender to palpation.  Genitourinary: No penile tenderness.  Foley catheter in place. No blood in sputum. No canal tenderness or swelling. No testicular tenderness or swelling. No rashes.  Musculoskeletal: He exhibits no edema.  Lymphadenopathy:    He has no cervical adenopathy.  Neurological: He is alert. Coordination normal.  Skin: Skin is warm and dry. No rash noted. He is not diaphoretic. No erythema. No pallor.  Psychiatric: He has a normal mood and affect. His behavior is normal.  Nursing note and vitals reviewed.   ED Course  Procedures (including critical care time) Labs Review Labs Reviewed  URINALYSIS,  ROUTINE W REFLEX MICROSCOPIC (NOT AT [facility]) - Abnormal; Notable for the following:    APPearance TURBID (*)    Hgb urine dipstick LARGE (*)    Protein, ur 100 (*)    Nitrite POSITIVE (*)    Leukocytes, UA LARGE (*)    All other components within normal limits  BASIC METABOLIC PANEL - Abnormal; Notable for the following:    CO2 20 (*)    Glucose, Bld 191 (*)    BUN 23 (*)    Creatinine, Ser 1.63 (*)    GFR calc non Af Amer 44 (*)    GFR calc Af Amer 51 (*)    All other components within normal limits  CBC WITH DIFFERENTIAL/PLATELET - Abnormal; Notable for the following:    Hemoglobin 12.9 (*)    HCT 38.2 (*)    All other components within normal limits  URINE MICROSCOPIC-ADD ON - Abnormal; Notable for the following:    Squamous Epithelial / LPF 0-5 (*)    Bacteria,  UA MANY (*)    All other components within normal limits  URINE CULTURE    Imaging Review No results found. I have personally reviewed and evaluated these images and lab results as part of my medical decision-making.   EKG Interpretation None      Filed Vitals:   06/10/15 1900 06/10/15 1930 06/10/15 1937 06/10/15 2000  BP: 144/86 134/78 134/78 122/85  Pulse: 77 74 74 74  Temp:   98.2 F (36.8 C)   TempSrc:   Oral   Resp:   16   SpO2: 99% 98% 98% 94%     MDM   Meds given in ED:  Medications  sodium chloride 0.9 % bolus 1,000 mL (0 mLs Intravenous Stopped 06/10/15 2003)  cefTRIAXone (ROCEPHIN) 1 g in dextrose 5 % 50 mL IVPB (0 g Intravenous Stopped 06/10/15 2003)    New Prescriptions   No medications on file    Final diagnoses:  Hematuria  Foley catheter problem, initial encounter [facility])   This  is a 65 y.o. male with a history of prostate cancer and chronic foley catheter who presents to the ED complaining of intermittent blood from the tip of the penis since last week. He reports it was then resolved. Then returned again yesterday. He reports sensitivity to the tip of his penis. He denies any blood  in his urine. He denies any testicle pain or swelling. He reports he is followed by urologist Dr. Loel Lofty. He last had his Foley cath changed 6 weeks ago. On exam the patient is afebrile nontoxic appearing. His abdomen is soft and nontender palpation. On GU exam the patient has a Foley catheter in place. No tenderness or blood at the tip of his penis. Yellow urine is flowing from the catheter. No testicular tenderness to palpation. Urinalysis returned with nitrite positive urine and large leukocytes. Patient appears to have chronic urinary tract infections. Urine sent for culture. BMP reveals an elevated creatinine at 1.63. Most recent creatinine was 1.16 back and July 2016. No leukocyte is on CBC. We'll start with fluid bolus, a gram Rocephin and consult urology.  I consulted with urologist Dr. Alinda Money who suspects the patient see reviewed pain is likely related to bladder spasm due to his chronic indwelling Foley catheter. He does not want the patient sent home on antibiotics until urine culture has resulted. The patient has no fever or leukocytosis. He would like the patient to follow-up next week in office. Will discharge the patient was stricken specific return precautions. I advised him to follow-up with [facility] next week. I advised the patient to follow-up with their primary care provider this week. I advised the patient to return to the emergency department with new or worsening symptoms or new concerns. The patient verbalized understanding and agreement with plan.     This patient was discussed with Dr. Zenia Resides who agrees with assessment and plan.    Waynetta Pean, PA-C 06/10/15 2009  Lacretia Leigh, MD 06/12/15 2222

## 2015-06-14 LAB — URINE CULTURE: Culture: >=100000

## 2015-06-15 ENCOUNTER — Telehealth (HOSPITAL_BASED_OUTPATIENT_CLINIC_OR_DEPARTMENT_OTHER): Payer: Self-pay | Admitting: Emergency Medicine

## 2015-06-15 NOTE — Progress Notes (Signed)
ED Antimicrobial Stewardship Positive Culture Follow Up   Cole Conrad is an 65 y.o. male who presented to [facility] on 06/10/2015 with a chief complaint of  Chief Complaint  Patient presents with  . Hematuria    Recent Results (from the past 720 hour(s))  Urine culture     Status: None   Collection Time: 06/10/15  4:38 PM  Result Value Ref Range Status   Specimen Description URINE, CATHETERIZED  Final   Special Requests NONE  Final   Culture   Final    >=100,000 COLONIES/mL ESCHERICHIA COLI Performed at [facility]    Report Status 06/14/2015 FINAL  Final   Organism ID, Bacteria ESCHERICHIA COLI  Final      Susceptibility   Escherichia coli - MIC*    AMPICILLIN >=32 RESISTANT Resistant     CEFAZOLIN 8 SENSITIVE Sensitive     CEFTRIAXONE <=1 SENSITIVE Sensitive     CIPROFLOXACIN >=4 RESISTANT Resistant     GENTAMICIN <=1 SENSITIVE Sensitive     IMIPENEM <=0.25 SENSITIVE Sensitive     NITROFURANTOIN <=16 SENSITIVE Sensitive     TRIMETH/SULFA >=320 RESISTANT Resistant     AMPICILLIN/SULBACTAM >=32 RESISTANT Resistant     PIP/TAZO <=4 SENSITIVE Sensitive     * >=100,000 COLONIES/mL ESCHERICHIA COLI   F/U with urologist  ED Provider: Gloriann Loan, PA-C  Wynell Balloon 06/15/2015, 8:12 AM Infectious Diseases Pharmacist Phone# [phone]

## 2015-06-15 NOTE — Telephone Encounter (Signed)
Post ED Visit - Positive Culture Follow-up  Culture report reviewed by antimicrobial stewardship pharmacist:  []  Elenor Quinones, Pharm.D. []  Heide Guile, Pharm.D., BCPS [x]  Parks Neptune, Pharm.D. []  Alycia Rossetti, Pharm.D., BCPS []  [place], Pharm.D., BCPS, AAHIVP []  Legrand Como, Pharm.D., BCPS, AAHIVP []  Cassie Stewart, Pharm.D. []  Stephens November, Pharm.D.  Positive urine culture E. coli Treated with none, faxed urine culture results to [facility] [phone]  Hazle Nordmann 06/15/2015, 10:00 AM

## 2015-06-20 ENCOUNTER — Other Ambulatory Visit: Payer: Self-pay | Admitting: Family Medicine

## 2015-10-27 ENCOUNTER — Encounter: Payer: Self-pay | Admitting: Nurse Practitioner

## 2018-11-24 ENCOUNTER — Encounter: Payer: Self-pay | Admitting: Podiatry

## 2018-11-24 ENCOUNTER — Other Ambulatory Visit: Payer: Self-pay

## 2018-11-24 ENCOUNTER — Ambulatory Visit (INDEPENDENT_AMBULATORY_CARE_PROVIDER_SITE_OTHER): Payer: Medicare HMO | Admitting: Podiatry

## 2018-11-24 VITALS — BP 116/61 | Temp 98.1°F

## 2018-11-24 DIAGNOSIS — M79674 Pain in right toe(s): Secondary | ICD-10-CM | POA: Diagnosis not present

## 2018-11-24 DIAGNOSIS — E119 Type 2 diabetes mellitus without complications: Secondary | ICD-10-CM

## 2018-11-24 DIAGNOSIS — B351 Tinea unguium: Secondary | ICD-10-CM | POA: Diagnosis not present

## 2018-11-24 DIAGNOSIS — M79675 Pain in left toe(s): Secondary | ICD-10-CM | POA: Diagnosis not present

## 2018-11-24 DIAGNOSIS — L84 Corns and callosities: Secondary | ICD-10-CM | POA: Diagnosis not present

## 2018-11-24 NOTE — Patient Instructions (Signed)
Diabetes Mellitus and Foot Care Foot care is an important part of your health, especially when you have diabetes. Diabetes may cause you to have problems because of poor blood flow (circulation) to your feet and legs, which can cause your skin to:  Become thinner and drier.  Break more easily.  Heal more slowly.  Peel and crack. You may also have nerve damage (neuropathy) in your legs and feet, causing decreased feeling in them. This means that you may not notice minor injuries to your feet that could lead to more serious problems. Noticing and addressing any potential problems early is the best way to prevent future foot problems. How to care for your feet Foot hygiene  Wash your feet daily with warm water and mild soap. Do not use hot water. Then, pat your feet and the areas between your toes until they are completely dry. Do not soak your feet as this can dry your skin.  Trim your toenails straight across. Do not dig under them or around the cuticle. File the edges of your nails with an emery board or nail file.  Apply a moisturizing lotion or petroleum jelly to the skin on your feet and to dry, brittle toenails. Use lotion that does not contain alcohol and is unscented. Do not apply lotion between your toes. Shoes and socks  Wear clean socks or stockings every day. Make sure they are not too tight. Do not wear knee-high stockings since they may decrease blood flow to your legs.  Wear shoes that fit properly and have enough cushioning. Always look in your shoes before you put them on to be sure there are no objects inside.  To break in new shoes, wear them for just a few hours a day. This prevents injuries on your feet. Wounds, scrapes, corns, and calluses  Check your feet daily for blisters, cuts, bruises, sores, and redness. If you cannot see the bottom of your feet, use a mirror or ask someone for help.  Do not cut corns or calluses or try to remove them with medicine.  If you  find a minor scrape, cut, or break in the skin on your feet, keep it and the skin around it clean and dry. You may clean these areas with mild soap and water. Do not clean the area with peroxide, alcohol, or iodine.  If you have a wound, scrape, corn, or callus on your foot, look at it several times a day to make sure it is healing and not infected. Check for: ? Redness, swelling, or pain. ? Fluid or blood. ? Warmth. ? Pus or a bad smell. General instructions  Do not cross your legs. This may decrease blood flow to your feet.  Do not use heating pads or hot water bottles on your feet. They may burn your skin. If you have lost feeling in your feet or legs, you may not know this is happening until it is too late.  Protect your feet from hot and cold by wearing shoes, such as at the beach or on hot pavement.  Schedule a complete foot exam at least once a year (annually) or more often if you have foot problems. If you have foot problems, report any cuts, sores, or bruises to your health care provider immediately. Contact a health care provider if:  You have a medical condition that increases your risk of infection and you have any cuts, sores, or bruises on your feet.  You have an injury that is not   healing.  You have redness on your legs or feet.  You feel burning or tingling in your legs or feet.  You have pain or cramps in your legs and feet.  Your legs or feet are numb.  Your feet always feel cold.  You have pain around a toenail. Get help right away if:  You have a wound, scrape, corn, or callus on your foot and: ? You have pain, swelling, or redness that gets worse. ? You have fluid or blood coming from the wound, scrape, corn, or callus. ? Your wound, scrape, corn, or callus feels warm to the touch. ? You have pus or a bad smell coming from the wound, scrape, corn, or callus. ? You have a fever. ? You have a red line going up your leg. Summary  Check your feet every day  for cuts, sores, red spots, swelling, and blisters.  Moisturize feet and legs daily.  Wear shoes that fit properly and have enough cushioning.  If you have foot problems, report any cuts, sores, or bruises to your health care provider immediately.  Schedule a complete foot exam at least once a year (annually) or more often if you have foot problems. This information is not intended to replace advice given to you by your health care provider. Make sure you discuss any questions you have with your health care provider. Document Released: 03/30/2000 Document Revised: 05/15/2017 Document Reviewed: 05/04/2016 Elsevier Patient Education  2020 [employer]  Corns and Calluses Corns are small areas of thickened skin that occur on the top, sides, or tip of a toe. They contain a cone-shaped core with a point that can press on a nerve below. This causes pain.  Calluses are areas of thickened skin that can occur anywhere on the body, including the hands, fingers, palms, soles of the feet, and heels. Calluses are usually larger than corns. What are the causes? Corns and calluses are caused by rubbing (friction) or pressure, such as from shoes that are too tight or do not fit properly. What increases the risk? Corns are more likely to develop in people who have misshapen toes (toe deformities), such as hammer toes. Calluses can occur with friction to any area of the skin. They are more likely to develop in people who:  Work with their hands.  Wear shoes that fit poorly, are too tight, or are high-heeled.  Have toe deformities. What are the signs or symptoms? Symptoms of a corn or callus include:  A hard growth on the skin.  Pain or tenderness under the skin.  Redness and swelling.  Increased discomfort while wearing tight-fitting shoes, if your feet are affected. If a corn or callus becomes infected, symptoms may include:  Redness and swelling that gets worse.  Pain.  Fluid, blood, or  pus draining from the corn or callus. How is this diagnosed? Corns and calluses may be diagnosed based on your symptoms, your medical history, and a physical exam. How is this treated? Treatment for corns and calluses may include:  Removing the cause of the friction or pressure. This may involve: ? Changing your shoes. ? Wearing shoe inserts (orthotics) or other protective layers in your shoes, such as a corn pad. ? Wearing gloves.  Applying medicine to the skin (topical medicine) to help soften skin in the hardened, thickened areas.  Removing layers of dead skin with a file to reduce the size of the corn or callus.  Removing the corn or callus with a scalpel or   laser.  Taking antibiotic medicines, if your corn or callus is infected.  Having surgery, if a toe deformity is the cause. Follow these instructions at home:   Take over-the-counter and prescription medicines only as told by your health care provider.  If you were prescribed an antibiotic, take it as told by your health care provider. Do not stop taking it even if your condition starts to improve.  Wear shoes that fit well. Avoid wearing high-heeled shoes and shoes that are too tight or too loose.  Wear any padding, protective layers, gloves, or orthotics as told by your health care provider.  Soak your hands or feet and then use a file or pumice stone to soften your corn or callus. Do this as told by your health care provider.  Check your corn or callus every day for symptoms of infection. Contact a health care provider if you:  Notice that your symptoms do not improve with treatment.  Have redness or swelling that gets worse.  Notice that your corn or callus becomes painful.  Have fluid, blood, or pus coming from your corn or callus.  Have new symptoms. Summary  Corns are small areas of thickened skin that occur on the top, sides, or tip of a toe.  Calluses are areas of thickened skin that can occur anywhere  on the body, including the hands, fingers, palms, and soles of the feet. Calluses are usually larger than corns.  Corns and calluses are caused by rubbing (friction) or pressure, such as from shoes that are too tight or do not fit properly.  Treatment may include wearing any padding, protective layers, gloves, or orthotics as told by your health care provider. This information is not intended to replace advice given to you by your health care provider. Make sure you discuss any questions you have with your health care provider. Document Released: 01/07/2004 Document Revised: 07/23/2018 Document Reviewed: 02/13/2017 Elsevier Patient Education  2020 Elsevier Inc.  

## 2018-11-26 NOTE — Progress Notes (Signed)
Subjective: Cole Conrad presents today referred by Loretha Brasil, FNP for diabetic foot evaluation.  Patient relates 30 year history of diabetes.  Patient denies any history of foot wounds.  Patient admits history of numbness, tingling, burning in his feet on occasion. "It comes and goes." It does not prevent him from sleeping.  Today, patient c/o of painful, discolored, thick toenails which interfere with daily activities.  Pain is aggravated when wearing enclosed shoe gear.   Past Medical History:  Diagnosis Date  . Arthritis LUMBAR BACK, SHOULDER, LEFT KNEE, RIGHT WRIST  . Chronic joint pain   . Diabetes mellitus ORAL MEDS   TYPE II  . DJD (degenerative joint disease)   . Eczema   . ED (erectile dysfunction)   . GERD (gastroesophageal reflux disease)   . 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  1997 (15 YRS AGO)   [place]  . History of head injury 2010-  CLOSED  W/ CONCUSSION--   NO RESIDUAL  . Mild asthma SEASONAL -  PT HAS NO INHALER  . Prostate cancer ([place]) 05/01/2011 BX   ADENOCARCINOMA,gLEASON= 3+3=6,VOLUNE=68.8CC ,PSA=7.40  . Seasonal allergies     Patient Active Problem List   Diagnosis Date Noted  . ARF (acute renal failure) ([place]) 06/27/2012  . Leukocytosis 06/27/2012  . Candiduria 06/27/2012  . Obstructed Foley catheter ([place]) 06/27/2012  . Ileus ([place]) 11/15/2011  . Fever 11/14/2011  . Hypoglycemia associated with diabetes ([place]) 11/11/2011  . Anemia 11/11/2011  . Acute renal failure ([place]) 11/11/2011  . Hyperkalemia 11/11/2011  . Hyponatremia 11/11/2011  . Urinary retention 11/11/2011  . Rash 11/11/2011  . Prostate cancer ([place])   . Arthritis   . Hypertension   . DM, UNCOMPLICATED, TYPE II, UNCONTROLLED 11/26/2006  . DEGENERATIVE JOINT DISEASE, HIPS 11/26/2006  . DEGENERATIVE JOINT DISEASE, KNEES, BILATERAL 11/26/2006  . ASTHMA 11/22/2006  . BENIGN PROSTATIC HYPERTROPHY 11/22/2006   . [place] DISEASE, LUMBOSACRAL SPINE 11/22/2006  . ERECTILE DYSFUNCTION, ORGANIC, HX OF 11/22/2006    Past Surgical History:  Procedure Laterality Date  . CYSTOSCOPY  09/07/2011   Procedure: CYSTOSCOPY;  Surgeon: Claybon Jabs, MD;  Location: [facility];  Service: Urology;  Laterality: N/A;  no seeds noted in bladder  . RADIOACTIVE SEED IMPLANT  09/07/2011   Procedure: RADIOACTIVE SEED IMPLANT;  Surgeon: Claybon Jabs, MD;  Location: [facility];  Service: Urology;  Laterality: N/A;  84  seeds implanted  . ROTATOR CUFF REPAIR  01-10-2004   RIGHT     Current Outpatient Medications:  .  aspirin 81 MG chewable tablet, Chew 81 mg by mouth daily., Disp: , Rfl:  .  atorvastatin (LIPITOR) 10 MG tablet, , Disp: , Rfl:  .  cephALEXin (KEFLEX) 500 MG capsule, Take 1 capsule (500 mg total) by mouth 4 (four) times daily., Disp: 28 capsule, Rfl: 0 .  Cholecalciferol (VITAMIN D3) 5000 UNITS CAPS, Take 5,000 Units by mouth daily., Disp: , Rfl:  .  glipiZIDE (GLUCOTROL XL) 10 MG 24 hr tablet, Take 10 mg by mouth daily., Disp: , Rfl:  .  ketoconazole (NIZORAL) 2 % cream, APPLY TO AFFECTED AREA EVERY DAY, Disp: , Rfl:  .  metFORMIN (GLUCOPHAGE) 1000 MG tablet, Take 1,000 mg by mouth 2 (two) times daily with a meal., Disp: , Rfl:  .  Multiple Vitamin (MULTIVITAMIN WITH MINERALS) TABS, Take 1 tablet by mouth daily., Disp: , Rfl:  .  pantoprazole (PROTONIX) 40 MG tablet, , Disp: , Rfl:  .  tamsulosin (FLOMAX) 0.4 MG CAPS capsule, Take 0.4 mg by mouth daily after supper., Disp: , Rfl:  .  triamcinolone ointment (KENALOG) 0.1 %, , Disp: , Rfl:  .  TRULICITY 1.5 YW/7.3XT SOPN, , Disp: , Rfl:   No Known Allergies  Social History   Occupational History  . Not on file  Tobacco Use  . Smoking status: Former Smoker    Types: Cigarettes    Quit date: 09/02/1973    Years since quitting: 45.2  . Smokeless tobacco: Never Used  . Tobacco comment: QUIT SMOKING 1970'S   Substance and Sexual Activity  . Alcohol use: No  . Drug use: No    Comment: RECOVERING ADDICT  ([place], [place]) QUIT 1997  APPROX.  (15 YRS AGO)  . Sexual activity: Not on file    Family History  Problem Relation Age of Onset  . Prostate cancer Maternal Uncle     Immunization History  Administered Date(s) Administered  . Pneumococcal Polysaccharide-23 02/21/2004, 11/12/2011  . Td 10/18/2004    Review of systems: Positive Findings in bold print.  Constitutional:  chills, fatigue, fever, sweats, weight change Communication: Optometrist, sign Ecologist, hand writing, iPad/Android device Head: headaches, head injury Eyes: changes in vision, eye pain, glaucoma, cataracts, macular degeneration, diplopia, glare,  light sensitivity, eyeglasses or contacts, blindness Ears nose mouth throat: hearing impaired, hearing aids,  ringing in ears, deaf, sign language,  vertigo, nosebleeds,  rhinitis,  cold sores, snoring, swollen glands Cardiovascular: HTN, edema, arrhythmia, pacemaker in place, defibrillator in place, chest pain/tightness, chronic anticoagulation, blood clot, heart failure, MI Peripheral Vascular: leg cramps, varicose veins, blood clots, lymphedema, varicosities Respiratory:  difficulty breathing, denies congestion, SOB, wheezing, cough, emphysema, asthma Gastrointestinal: change in appetite or weight, abdominal pain, constipation, diarrhea, nausea, vomiting, vomiting blood, change in bowel habits, abdominal pain, jaundice, rectal bleeding, hemorrhoids, GERD Genitourinary:  nocturia,  pain on urination, polyuria,  blood in urine, Foley catheter, urinary urgency, ESRD on hemodialysis Musculoskeletal: amputation, cramping, stiff joints, painful joints, decreased joint motion, fractures, OA, gout, hemiplegia, paraplegia, uses cane, wheelchair bound, uses walker, uses rollator Skin: +changes in toenails, color change, dryness, itching, mole changes,  rash,  wound(s) Neurological: headaches, numbness in feet, paresthesias in feet, burning in feet, fainting,  seizures, change in speech,  headaches, memory problems/poor historian, cerebral palsy, weakness, paralysis, CVA, TIA Endocrine: diabetes, hypothyroidism, hyperthyroidism,  goiter, dry mouth, flushing, heat intolerance,  cold intolerance,  excessive thirst, denies polyuria,  nocturia Hematological:  easy bleeding, excessive bleeding, easy bruising, enlarged lymph nodes, on long term blood thinner, history of past transusions Allergy/immunological:  hives, eczema, frequent infections, multiple drug allergies, seasonal allergies, transplant recipient, multiple food allergies Psychiatric:  anxiety, depression, mood disorder, suicidal ideations, hallucinations, insomnia  Objective: Vitals:   11/24/18 0858  BP: 116/61  Temp: 98.1 F (36.7 C)   Vascular Examination: Capillary refill time immediate x 10 digits.  Dorsalis pedis and Posterior tibial pulses palpable b/l.  Digital hair sparse x 10 digits.  Skin temperature gradient WNL b/l.  Dermatological Examination: Skin with normal turgor, texture and tone b/l.  Toenails 1-5 b/l discolored, thick, dystrophic with subungual debris and pain with palpation to nailbeds due to thickness of nails.  Hyperkeratotic lesions medial aspect left 5th digit and lateral aspect left 4th digit. No erythema, no edema, no drainage, no flocculence noted.  Musculoskeletal: Muscle strength 5/5 to all LE muscle groups.  Hammertoes b/l.  Neurological: Sensation  intact 5/5 b/l with 10 gram monofilament.  Vibratory sensation intact b/l.  Assessment: 1. Painful onychomycosis toenails 1-5 b/l  2. Interdigital corns left 4th and 5th digits 3. NIDDM  Plan: 1. Discussed diabetic foot care principles. Literature dispensed on today. 2. Toenails 1-5 b/l were debrided in length and girth without iatrogenic bleeding. 3. Corns left 4th and 5th digits pared  utilizing sterile scalpel blade without incident. 4. Patient to continue soft, supportive shoe gear 5. Patient to report any pedal injuries to medical professional immediately. 6. Follow up 3 months.  7. Patient/POA to call should there be a concern in the interim.

## 2019-02-23 ENCOUNTER — Ambulatory Visit: Payer: Medicare HMO | Admitting: Podiatry

## 2019-05-25 ENCOUNTER — Ambulatory Visit: Payer: Medicare HMO | Admitting: Podiatry

## 2019-07-10 ENCOUNTER — Ambulatory Visit: Payer: Medicare HMO | Admitting: Podiatry

## 2019-09-11 ENCOUNTER — Ambulatory Visit: Payer: Medicare HMO | Admitting: Podiatry

## 2019-09-17 ENCOUNTER — Ambulatory Visit
Admission: RE | Admit: 2019-09-17 | Discharge: 2019-09-17 | Disposition: A | Discharge status: Home / Self Care | Payer: Medicare HMO | Source: Ambulatory Visit | Attending: Family | Admitting: Family

## 2019-09-17 ENCOUNTER — Other Ambulatory Visit: Payer: Self-pay | Admitting: Family

## 2019-09-17 ENCOUNTER — Other Ambulatory Visit: Payer: Self-pay

## 2019-09-17 DIAGNOSIS — M542 Cervicalgia: Secondary | ICD-10-CM

## 2019-11-17 ENCOUNTER — Ambulatory Visit: Payer: Medicare HMO | Admitting: Podiatry

## 2020-01-30 ENCOUNTER — Ambulatory Visit: Payer: Medicare HMO | Attending: Internal Medicine

## 2020-01-30 DIAGNOSIS — Z23 Encounter for immunization: Secondary | ICD-10-CM

## 2020-02-17 ENCOUNTER — Ambulatory Visit: Payer: Medicare HMO | Admitting: Podiatry

## 2020-02-19 ENCOUNTER — Ambulatory Visit
Admission: RE | Admit: 2020-02-19 | Discharge: 2020-02-19 | Disposition: A | Discharge status: Home / Self Care | Payer: Medicare HMO | Source: Ambulatory Visit | Attending: Student | Admitting: Student

## 2020-02-19 ENCOUNTER — Other Ambulatory Visit: Payer: Self-pay | Admitting: Student

## 2020-02-19 DIAGNOSIS — R2232 Localized swelling, mass and lump, left upper limb: Secondary | ICD-10-CM

## 2020-02-19 DIAGNOSIS — M25512 Pain in left shoulder: Secondary | ICD-10-CM

## 2020-03-04 DIAGNOSIS — M25522 Pain in left elbow: Secondary | ICD-10-CM | POA: Insufficient documentation

## 2020-04-04 DIAGNOSIS — M25512 Pain in left shoulder: Secondary | ICD-10-CM | POA: Insufficient documentation

## 2020-05-23 ENCOUNTER — Ambulatory Visit (INDEPENDENT_AMBULATORY_CARE_PROVIDER_SITE_OTHER): Payer: Medicare Other | Admitting: Podiatry

## 2020-05-23 ENCOUNTER — Other Ambulatory Visit: Payer: Self-pay

## 2020-05-23 ENCOUNTER — Encounter: Payer: Self-pay | Admitting: Podiatry

## 2020-05-23 DIAGNOSIS — M79675 Pain in left toe(s): Secondary | ICD-10-CM | POA: Diagnosis not present

## 2020-05-23 DIAGNOSIS — S90211A Contusion of right great toe with damage to nail, initial encounter: Secondary | ICD-10-CM | POA: Diagnosis not present

## 2020-05-23 DIAGNOSIS — M79674 Pain in right toe(s): Secondary | ICD-10-CM | POA: Diagnosis not present

## 2020-05-23 DIAGNOSIS — B351 Tinea unguium: Secondary | ICD-10-CM

## 2020-05-23 DIAGNOSIS — E119 Type 2 diabetes mellitus without complications: Secondary | ICD-10-CM

## 2020-05-23 DIAGNOSIS — B353 Tinea pedis: Secondary | ICD-10-CM | POA: Diagnosis not present

## 2020-05-23 MED ORDER — KETOCONAZOLE 2 % EX CREA: TOPICAL_CREAM | CUTANEOUS | 1 refills | Status: AC

## 2020-05-26 NOTE — Progress Notes (Signed)
Subjective:  Patient ID: Cole Conrad, male    DOB: 1950/05/06,  MRN: [id number]  70 y.o. male presents with preventative diabetic foot care and painful thick toenails that are difficult to trim. Pain interferes with ambulation. Aggravating factors include wearing enclosed shoe gear. Pain is relieved with periodic professional debridement..    Patient states he dropped a box on his right great toe about 8 months ago. He states he has some discoloration of right hallux under the toenail. Digit is not painful. Denies any swelling or drainage.  He also states he pulled the toenail of the left 2nd toe while picking at it.   Mr. Hauss has another complaint of scaly patch on medial aspect of his right foot and is requesting a cream for this.  Review of Systems: Negative except as noted in the HPI.  Past Medical History:  Diagnosis Date  . Arthritis LUMBAR BACK, SHOULDER, LEFT KNEE, RIGHT WRIST  . Chronic joint pain   . Diabetes mellitus ORAL MEDS   TYPE II  . DJD (degenerative joint disease)   . Eczema   . ED (erectile dysfunction)   . GERD (gastroesophageal reflux disease)   . 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  1997 (15 YRS AGO)   [place]  . History of head injury 2010-  CLOSED  W/ CONCUSSION--   NO RESIDUAL  . Mild asthma SEASONAL -  PT HAS NO INHALER  . Prostate cancer ([place]) 05/01/2011 BX   ADENOCARCINOMA,gLEASON= 3+3=6,VOLUNE=68.8CC ,PSA=7.40  . Seasonal allergies    Past Surgical History:  Procedure Laterality Date  . CYSTOSCOPY  09/07/2011   Procedure: CYSTOSCOPY;  Surgeon: Claybon Jabs, MD;  Location: [facility];  Service: Urology;  Laterality: N/A;  no seeds noted in bladder  . RADIOACTIVE SEED IMPLANT  09/07/2011   Procedure: RADIOACTIVE SEED IMPLANT;  Surgeon: Claybon Jabs, MD;  Location: [facility];  Service: Urology;  Laterality: N/A;  84  seeds implanted  .  ROTATOR CUFF REPAIR  01-10-2004   RIGHT   Patient Active Problem List   Diagnosis Date Noted  . Pain in joint of left shoulder 04/04/2020  . Pain in joint of left elbow 03/04/2020  . ARF (acute renal failure) ([place]) 06/27/2012  . Leukocytosis 06/27/2012  . Candiduria 06/27/2012  . Obstructed Foley catheter ([place]) 06/27/2012  . Ileus ([place]) 11/15/2011  . Fever 11/14/2011  . Hypoglycemia associated with diabetes ([place]) 11/11/2011  . Anemia 11/11/2011  . Acute renal failure ([place]) 11/11/2011  . Hyperkalemia 11/11/2011  . Hyponatremia 11/11/2011  . Urinary retention 11/11/2011  . Rash 11/11/2011  . Prostate cancer ([place])   . Arthritis   . Hypertension   . DM, UNCOMPLICATED, TYPE II, UNCONTROLLED 11/26/2006  . DEGENERATIVE JOINT DISEASE, HIPS 11/26/2006  . DEGENERATIVE JOINT DISEASE, KNEES, BILATERAL 11/26/2006  . ASTHMA 11/22/2006  . BENIGN PROSTATIC HYPERTROPHY 11/22/2006  . [place] DISEASE, LUMBOSACRAL SPINE 11/22/2006  . ERECTILE DYSFUNCTION, ORGANIC, HX OF 11/22/2006    Current Outpatient Medications:  .  ACCU-CHEK AVIVA PLUS test strip, daily as needed., Disp: , Rfl:  .  Accu-Chek Softclix Lancets lancets, Accu-Chek Softclix Lancets, Disp: , Rfl:  .  aspirin 81 MG chewable tablet, Chew 81 mg by mouth daily., Disp: , Rfl:  .  atorvastatin (LIPITOR) 10 MG tablet, , Disp: , Rfl:  .  atorvastatin (LIPITOR) 20 MG tablet, , Disp: , Rfl:  .  cephALEXin (  KEFLEX) 500 MG capsule, Take 1 capsule (500 mg total) by mouth 4 (four) times daily., Disp: 28 capsule, Rfl: 0 .  Cholecalciferol (VITAMIN D3) 5000 UNITS CAPS, Take 5,000 Units by mouth daily., Disp: , Rfl:  .  diazepam (VALIUM) 5 MG tablet, Valium 5 mg tablet  Take 1-2 tablet(s) 30 minutes prior to MRI by oral route., Disp: , Rfl:  .  diazepam (VALIUM) 5 MG tablet, SMARTSIG:1-2 Tablet(s) By Mouth, Disp: , Rfl:  .  glipiZIDE (GLUCOTROL) 10 MG tablet, glipizide 10 mg tablet, Disp: , Rfl:  .  glucose blood test strip, Accu-Chek  Aviva Plus test strips, Disp: , Rfl:  .  ketoconazole (NIZORAL) 2 % cream, Apply to affected area of both feet once daily for four weeks., Disp: 30 g, Rfl: 1 .  metFORMIN (GLUCOPHAGE) 1000 MG tablet, Take 1,000 mg by mouth 2 (two) times daily with a meal., Disp: , Rfl:  .  Multiple Vitamin (MULTIVITAMIN WITH MINERALS) TABS, Take 1 tablet by mouth daily., Disp: , Rfl:  .  pantoprazole (PROTONIX) 20 MG tablet, pantoprazole 20 mg tablet,delayed release, Disp: , Rfl:  .  predniSONE (STERAPRED UNI-PAK 21 TAB) 10 MG (21) TBPK tablet, prednisone 10 mg tablets in a dose pack, Disp: , Rfl:  .  predniSONE (STERAPRED UNI-PAK 21 TAB) 10 MG (21) TBPK tablet, Take by mouth as directed., Disp: , Rfl:  .  tamsulosin (FLOMAX) 0.4 MG CAPS capsule, Take 0.4 mg by mouth daily after supper., Disp: , Rfl:  .  triamcinolone ointment (KENALOG) 0.1 %, , Disp: , Rfl:  .  TRULICITY 1.5 ZO/1.0RU SOPN, , Disp: , Rfl:  No Known Allergies Social History   Tobacco Use  Smoking Status Former Smoker  . Types: Cigarettes  . Quit date: 09/02/1973  . Years since quitting: 46.7  Smokeless Tobacco Never Used  Tobacco Comment   QUIT SMOKING 1970'S    Objective:  There were no vitals filed for this visit. Constitutional Patient is a pleasant 70 y.o. African American male in NAD. AAO x 3.  Vascular Capillary refill time to digits immediate b/l. Palpable pedal pulses b/l LE. Pedal hair sparse. Lower extremity skin temperature gradient within normal limits. No pain with calf compression b/l. No cyanosis or clubbing noted.  Neurologic Normal speech. Protective sensation intact 5/5 intact bilaterally with 10g monofilament b/l. Vibratory sensation intact b/l.  Dermatologic Pedal skin with normal turgor, texture and tone bilaterally. No open wounds bilaterally. No interdigital macerations bilaterally. Toenails 1-5 b/l elongated, discolored, dystrophic, thickened, crumbly with subungual debris and tenderness to dorsal palpation. There is  evidence of subacute subungual hematoma of the R hallux. Nailplate remains adhered. There is no  tenderness to palpation. Diffuse scaling noted medial aspect of right foot.  No interdigital macerations.  No blisters, no weeping. No signs of secondary bacterial infection noted.  Orthopedic: Normal muscle strength 5/5 to all lower extremity muscle groups bilaterally. No pain crepitus or joint limitation noted with ROM b/l. Hammertoes noted to the 2-5 bilaterally.   Assessment:   1. Pain due to onychomycosis of toenails of both feet   2. Tinea pedis of both feet   3. Subungual hematoma of great toe of right foot, initial encounter   4. Controlled type 2 diabetes mellitus without complication, without long-term current use of insulin ([place])   5. Encounter for diabetic foot exam [facility])    Plan:  Patient was evaluated and treated and all questions answered.  Onychomycosis with pain -Nails palliatively debridement as below. -  Educated on self-care  Procedure: Nail Debridement Rationale: Pain Type of Debridement: manual, sharp debridement. Instrumentation: Nail nipper, rotary burr. Number of Nails: 10  -Examined patient. -Diabetic foot exam performed today. Continue diabetic foot care principles. -Patient to continue soft, supportive shoe gear daily. -Toenails 1-5 b/l were debrided in length and girth with sterile nail nippers and dremel without iatrogenic bleeding.  -Patient to report any pedal injuries to medical professional immediately. -For tinea pedis, prescription sent to pharmacy for Ketoconazole Cream 2% to be applied to both feet and between toes qd x 6 weeks. -For subungual hematoma right hallux, monitor for now. -He is to apply Neosporin to left 2nd toe once daily. -Patient/POA to call should there be question/concern in the interim.  Return in about 3 months (around 08/20/2020).  Marzetta Board, DPM

## 2020-08-29 ENCOUNTER — Ambulatory Visit (INDEPENDENT_AMBULATORY_CARE_PROVIDER_SITE_OTHER): Payer: Medicare Other | Admitting: Podiatry

## 2020-08-29 ENCOUNTER — Other Ambulatory Visit: Payer: Self-pay

## 2020-08-29 DIAGNOSIS — B351 Tinea unguium: Secondary | ICD-10-CM

## 2020-08-29 DIAGNOSIS — M79674 Pain in right toe(s): Secondary | ICD-10-CM

## 2020-08-29 DIAGNOSIS — M79675 Pain in left toe(s): Secondary | ICD-10-CM

## 2020-08-29 DIAGNOSIS — E119 Type 2 diabetes mellitus without complications: Secondary | ICD-10-CM

## 2020-09-03 ENCOUNTER — Encounter: Payer: Self-pay | Admitting: Podiatry

## 2020-09-03 NOTE — Progress Notes (Signed)
  Subjective:  Patient ID: Cole Conrad, male    DOB: March 06, 1951,  MRN: [id number]  70 y.o. male presents with preventative diabetic foot care and painful thick toenails that are difficult to trim. Pain interferes with ambulation. Aggravating factors include wearing enclosed shoe gear. Pain is relieved with periodic professional debridement.   He states his toenails grow slowly now. He voices no new pedal problems on today's visit.  Patient's blood sugar was 145 mg/dl yesterday morning.  PCP: Loretha Brasil, FNP (Inactive) and last visit was: two weeks ago.  Review of Systems: Negative except as noted in the HPI.   No Known Allergies  Objective:  There were no vitals filed for this visit. Constitutional Patient is a pleasant 70 y.o. African American male in NAD. AAO x 3.  Vascular Capillary refill time to digits immediate b/l. Palpable pedal pulses b/l LE. Pedal hair sparse. Lower extremity skin temperature gradient within normal limits. No pain with calf compression b/l. No cyanosis or clubbing noted.  Neurologic Normal speech. Protective sensation intact 5/5 intact bilaterally with 10g monofilament b/l. Vibratory sensation intact b/l. Proprioception intact bilaterally.  Dermatologic Pedal skin with normal turgor, texture and tone bilaterally. No open wounds bilaterally. No interdigital macerations bilaterally. Toenails L hallux and R hallux elongated, discolored, dystrophic, thickened, and crumbly with subungual debris and tenderness to dorsal palpation. Toenails 2-5 b/l with adequate length.  Orthopedic: Normal muscle strength 5/5 to all lower extremity muscle groups bilaterally. No pain crepitus or joint limitation noted with ROM b/l. Hammertoe(s) noted to the 2-5 bilaterally.   No flowsheet data found.     Assessment:   1. Pain due to onychomycosis of toenails of both feet   2. Controlled type 2 diabetes mellitus without complication, without long-term current use of insulin  ([place])    Plan:  Patient was evaluated and treated and all questions answered.  Onychomycosis with pain -Nails palliatively debridement as below. -Educated on self-care  Procedure: Nail Debridement Rationale: Pain Type of Debridement: manual, sharp debridement. Instrumentation: Nail nipper, rotary burr. Number of Nails: 2  -Examined patient. -No new findings. No new orders. -Continue diabetic foot care principles. -Patient to continue soft, supportive shoe gear daily. -Toenails L hallux and R hallux debrided in length and girth without iatrogenic bleeding with sterile nail nipper and dremel.  -Patient to report any pedal injuries to medical professional immediately. -Patient/POA to call should there be question/concern in the interim.  Return in about 3 months (around 11/29/2020).  Marzetta Board, DPM

## 2020-11-30 ENCOUNTER — Ambulatory Visit (INDEPENDENT_AMBULATORY_CARE_PROVIDER_SITE_OTHER): Payer: Medicare Other | Admitting: Podiatry

## 2020-11-30 ENCOUNTER — Other Ambulatory Visit: Payer: Self-pay

## 2020-11-30 ENCOUNTER — Encounter: Payer: Self-pay | Admitting: Podiatry

## 2020-11-30 DIAGNOSIS — M79674 Pain in right toe(s): Secondary | ICD-10-CM

## 2020-11-30 DIAGNOSIS — M79675 Pain in left toe(s): Secondary | ICD-10-CM | POA: Diagnosis not present

## 2020-11-30 DIAGNOSIS — B351 Tinea unguium: Secondary | ICD-10-CM | POA: Diagnosis not present

## 2020-12-02 ENCOUNTER — Ambulatory Visit: Payer: Medicare Other | Admitting: Podiatry

## 2020-12-04 NOTE — Progress Notes (Signed)
Subjective: Cole Conrad is a pleasant 70 y.o. male patient seen today for painful thick toenails that are difficult to trim. Pain interferes with ambulation. Aggravating factors include wearing enclosed shoe gear. Pain is relieved with periodic professional debridement.  Patient states their blood glucose was 141 mg/dl.  PCP is Daye, Terrilee Files, FNP (Inactive). Last visit was: July, 2022.  No Known Allergies  Objective: Physical Exam  General: Cole Conrad is a pleasant 70 y.o. African American male, in NAD. AAO x 3.   Vascular:  Capillary refill time to digits immediate b/l. Palpable DP pulse(s) b/l lower extremities Palpable PT pulse(s) b/l lower extremities Pedal hair sparse. Lower extremity skin temperature gradient within normal limits. No pain with calf compression b/l. No edema noted b/l lower extremities.  Dermatological:  Skin warm and supple b/l lower extremities. No open wounds b/l lower extremities. No interdigital macerations b/l lower extremities. Toenails 1-5 b/l elongated, discolored, dystrophic, thickened, crumbly with subungual debris and tenderness to dorsal palpation.  Musculoskeletal:  Normal muscle strength 5/5 to all lower extremity muscle groups bilaterally. No pain crepitus or joint limitation noted with ROM b/l lower extremities. Hammertoe(s) noted to the 2-5 bilaterally.  Neurological:  Protective sensation intact 5/5 intact bilaterally with 10g monofilament b/l. Vibratory sensation intact b/l.  Assessment and Plan:  1. Pain due to onychomycosis of toenails of both feet   -Examined patient. -Continue diabetic foot care principles: inspect feet daily, monitor glucose as recommended by PCP and/or Endocrinologist, and follow prescribed diet per PCP, Endocrinologist and/or dietician. -Patient to continue soft, supportive shoe gear daily. -Toenails 1-5 b/l were debrided in length and girth with sterile nail nippers and dremel without iatrogenic bleeding.   -Patient to report any pedal injuries to medical professional immediately. -Patient/POA to call should there be question/concern in the interim.  Return in about 3 months (around 03/02/2021).  Marzetta Board, DPM

## 2021-03-07 ENCOUNTER — Encounter: Payer: Self-pay | Admitting: Podiatry

## 2021-03-07 ENCOUNTER — Other Ambulatory Visit: Payer: Self-pay

## 2021-03-07 ENCOUNTER — Ambulatory Visit (INDEPENDENT_AMBULATORY_CARE_PROVIDER_SITE_OTHER): Payer: Medicare Other | Admitting: Podiatry

## 2021-03-07 DIAGNOSIS — B351 Tinea unguium: Secondary | ICD-10-CM

## 2021-03-07 DIAGNOSIS — M79675 Pain in left toe(s): Secondary | ICD-10-CM

## 2021-03-07 DIAGNOSIS — M79674 Pain in right toe(s): Secondary | ICD-10-CM

## 2021-03-07 DIAGNOSIS — E1165 Type 2 diabetes mellitus with hyperglycemia: Secondary | ICD-10-CM

## 2021-03-07 DIAGNOSIS — Q828 Other specified congenital malformations of skin: Secondary | ICD-10-CM | POA: Diagnosis not present

## 2021-03-10 ENCOUNTER — Encounter: Payer: Self-pay | Admitting: Podiatry

## 2021-03-10 NOTE — Progress Notes (Signed)
  Subjective:  Patient ID: Cole Conrad, male    DOB: 1950-08-20,  MRN: [id number]  Cole Conrad presents to clinic today for preventative diabetic foot care and painful porokeratotic lesion(s) left foot and painful mycotic toenails that limit ambulation. Painful toenails interfere with ambulation. Aggravating factors include wearing enclosed shoe gear. Pain is relieved with periodic professional debridement. Painful porokeratotic lesions are aggravated when weightbearing with and without shoegear. Pain is relieved with periodic professional debridement.  Patient states blood glucose was 160 mg/dl today. He states his last A1c was 12/1% due to him being placed on steroids.   He voices no new pedal concerns on today's visit.   PCP is Cipriano Mile, NP , and last visit was February 12, 2021.  No Known Allergies  Review of Systems: Negative except as noted in the HPI. Objective:   Constitutional Cole Conrad is a pleasant 70 y.o. African American male, in NAD. AAO x 3.   Vascular CFT immediate b/l LE. Palpable DP/PT pulses b/l LE. Digital hair sparse b/l. Skin temperature gradient WNL b/l. No pain with calf compression b/l. No edema noted b/l. No cyanosis or clubbing noted b/l LE.   Neurologic Normal speech. Oriented to person, place, and time. Protective sensation intact 5/5 intact bilaterally with 10g monofilament b/l. Vibratory sensation intact b/l.  Dermatologic Pedal integument with normal turgor, texture and tone b/l LE. No open wounds b/l. No interdigital macerations b/l. Toenails 1-5 b/l elongated, thickened, discolored with subungual debris. +Tenderness with dorsal palpation of nailplates. Porokeratotic lesion(s) noted submet head 2 left foot.  Orthopedic: Normal muscle strength 5/5 to all lower extremity muscle groups bilaterally. Hammertoe deformity noted 2-5 b/l.Marland Kitchen No pain, crepitus or joint limitation noted with ROM b/l LE.  Patient ambulates independently without assistive  aids.   Radiographs: None Assessment:   1. Pain due to onychomycosis of toenails of both feet   2. Porokeratosis   3. Uncontrolled type 2 diabetes mellitus with hyperglycemia ([place])    Plan:  Patient was evaluated and treated and all questions answered. Consent given for treatment as described below: -No new findings. No new orders. -Continue foot and shoe inspections daily. Monitor blood glucose per PCP/Endocrinologist's recommendations. -Mycotic toenails 1-5 bilaterally were debrided in length and girth with sterile nail nippers and dremel without incident. -Painful porokeratotic lesion(s) submet head 2 left foot pared and enucleated with sterile scalpel blade without incident. Total number of lesions debrided=1. -Patient/POA to call should there be question/concern in the interim.  Return in about 3 months (around 06/07/2021).  Marzetta Board, DPM

## 2021-06-19 ENCOUNTER — Other Ambulatory Visit: Payer: Self-pay

## 2021-06-19 ENCOUNTER — Encounter: Payer: Self-pay | Admitting: Podiatry

## 2021-06-19 ENCOUNTER — Ambulatory Visit (INDEPENDENT_AMBULATORY_CARE_PROVIDER_SITE_OTHER): Payer: Medicare Other | Admitting: Podiatry

## 2021-06-19 DIAGNOSIS — E1165 Type 2 diabetes mellitus with hyperglycemia: Secondary | ICD-10-CM | POA: Diagnosis not present

## 2021-06-19 DIAGNOSIS — Q828 Other specified congenital malformations of skin: Secondary | ICD-10-CM | POA: Diagnosis not present

## 2021-06-19 DIAGNOSIS — M2041 Other hammer toe(s) (acquired), right foot: Secondary | ICD-10-CM

## 2021-06-19 DIAGNOSIS — M79674 Pain in right toe(s): Secondary | ICD-10-CM

## 2021-06-19 DIAGNOSIS — B351 Tinea unguium: Secondary | ICD-10-CM

## 2021-06-19 DIAGNOSIS — E119 Type 2 diabetes mellitus without complications: Secondary | ICD-10-CM

## 2021-06-19 DIAGNOSIS — M79675 Pain in left toe(s): Secondary | ICD-10-CM | POA: Diagnosis not present

## 2021-06-19 DIAGNOSIS — M2042 Other hammer toe(s) (acquired), left foot: Secondary | ICD-10-CM

## 2021-06-19 NOTE — Progress Notes (Signed)
ANNUAL DIABETIC FOOT EXAM  Subjective: Cole Conrad presents today for annual diabetic foot examination.  Patient relates 25 year h/o diabetes.  Patient denies any h/o foot wounds.  Patient denies any numbness, tingling, burning, or pins/needle sensation in feet.  Patient's blood sugar was 161 mg/dl yesterday.  Patient did not check blood glucose this morning.  Patient states his last A1c was 11.1, decreased from 13. The A1c of 13 was the result of a course of steroid therapy. He states it is decreasing now.  Risk factors: uncontrolled diabetes, HTN.  Cole Mile, NP is patient's PCP. Last visit was February 12, 2021.  Patient states left foot porokeratosis is painful today. He did apply OTC corn remover to the left foot lesion on Friday. He states a small piece of skin came off, then he was afraid, so he stopped using it. He denies any redness, drainage, swelling or odor from the lesion.  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  1997 (15 YRS AGO)   STATES NO DRUG USE SINCE   History of head injury 2010-  CLOSED  W/ CONCUSSION--   NO RESIDUAL   Mild asthma SEASONAL -  PT HAS NO INHALER   Prostate cancer ([place]) 05/01/2011 BX   ADENOCARCINOMA,gLEASON= 3+3=6,VOLUNE=68.8CC ,PSA=7.40   Seasonal allergies    Patient Active Problem List   Diagnosis Date Noted   Pain in joint of left shoulder 04/04/2020   Pain in joint of left elbow 03/04/2020   ARF (acute renal failure) ([place]) 06/27/2012   Leukocytosis 06/27/2012   Candiduria 06/27/2012   Obstructed Foley catheter ([place]) 06/27/2012   Ileus ([place]) 11/15/2011   Fever 11/14/2011   Hypoglycemia associated  with diabetes ([place]) 11/11/2011   Anemia 11/11/2011   Acute renal failure ([place]) 11/11/2011   Hyperkalemia 11/11/2011   Hyponatremia 11/11/2011   Urinary retention 11/11/2011   Rash 11/11/2011   Prostate cancer ([place])    Arthritis    Hypertension    DM, UNCOMPLICATED, TYPE II, UNCONTROLLED 11/26/2006   DEGENERATIVE JOINT DISEASE, HIPS 11/26/2006   DEGENERATIVE JOINT DISEASE, KNEES, BILATERAL 11/26/2006   ASTHMA 11/22/2006   BENIGN PROSTATIC HYPERTROPHY 11/22/2006   DEGENERATIVE [place] DISEASE, LUMBOSACRAL SPINE 11/22/2006   ERECTILE DYSFUNCTION, ORGANIC, HX OF 11/22/2006   Past Surgical History:  Procedure Laterality Date   CYSTOSCOPY  09/07/2011   Procedure: CYSTOSCOPY;  Surgeon: Claybon Jabs, MD;  Location: [facility];  Service: Urology;  Laterality: N/A;  no seeds noted in bladder   RADIOACTIVE SEED IMPLANT  09/07/2011   Procedure: RADIOACTIVE SEED IMPLANT;  Surgeon: Claybon Jabs, MD;  Location: [facility];  Service: Urology;  Laterality: N/A;  84  seeds implanted   ROTATOR CUFF REPAIR  01-10-2004   RIGHT   Current Outpatient Medications on File Prior to Visit  Medication Sig Dispense Refill   ACCU-CHEK AVIVA PLUS test strip daily as needed.     Accu-Chek Softclix Lancets lancets Accu-Chek Softclix Lancets     aspirin 81 MG chewable tablet Chew 81 mg by mouth daily.     atorvastatin (LIPITOR) 20 MG tablet atorvastatin 20 mg  tablet     Blood Glucose Monitoring Suppl (ACCU-CHEK GUIDE) w/Device KIT USE AS DIRECTED TO CHECK BLOOD SUGAR ONCE DAILY     cephALEXin (KEFLEX) 500 MG capsule Take 1 capsule (500 mg total) by mouth 4 (four) times daily. 28 capsule 0   Cholecalciferol (VITAMIN D3) 5000 UNITS CAPS Take 5,000 Units by mouth daily.     diazepam (VALIUM) 5 MG tablet Valium 5 mg tablet  Take 1-2 tablet(s) 30 minutes prior to MRI by oral route.     diazepam (VALIUM) 5 MG tablet SMARTSIG:1-2 Tablet(s) By Mouth     glipiZIDE (GLUCOTROL) 10 MG tablet  glipizide 10 mg tablet     glucose blood test strip Accu-Chek Aviva Plus test strips     HYDROcodone-acetaminophen (NORCO/VICODIN) 5-325 MG tablet Take 1 tablet by mouth every 6 (six) hours as needed.     ketoconazole (NIZORAL) 2 % cream Apply to affected area of both feet once daily for four weeks. 30 g 1   levofloxacin (LEVAQUIN) 500 MG tablet Take 500 mg by mouth 2 (two) times daily.     metFORMIN (GLUCOPHAGE) 1000 MG tablet Take 1,000 mg by mouth 2 (two) times daily with a meal.     Multiple Vitamin (MULTIVITAMIN WITH MINERALS) TABS Take 1 tablet by mouth daily.     nabumetone (RELAFEN) 750 MG tablet Take 750 mg by mouth 2 (two) times daily as needed.     pantoprazole (PROTONIX) 20 MG tablet pantoprazole 20 mg tablet,delayed release     predniSONE (STERAPRED UNI-PAK 21 TAB) 10 MG (21) TBPK tablet prednisone 10 mg tablets in a dose pack     predniSONE (STERAPRED UNI-PAK 21 TAB) 10 MG (21) TBPK tablet Take by mouth as directed.     QUICKVUE AT-HOME COVID-19 TEST KIT USE ACCORDING TO MANUFACTURER'S DIRECTIONS     tamsulosin (FLOMAX) 0.4 MG CAPS capsule Take 0.4 mg by mouth daily after supper.     triamcinolone ointment (KENALOG) 0.1 %      TRULICITY 1.5 KC/1.2XN SOPN      TRULICITY 3 TZ/0.0FV SOPN SMARTSIG:0.5 Milliliter(s) SUB-Q Once a Week     No current facility-administered medications on file prior to visit.    No Known Allergies Social History   Occupational History   Not on file  Tobacco Use   Smoking status: Former    Types: Cigarettes    Quit date: 09/02/1973    Years since quitting: 47.8   Smokeless tobacco: Never   Tobacco comments:    QUIT SMOKING 1970'S  Substance and Sexual Activity   Alcohol use: No   Drug use: No    Comment: RECOVERING ADDICT  ([place], [place]) QUIT 1997  APPROX.  (15 YRS AGO)   Sexual activity: Not on file   Family History  Problem Relation Age of Onset   Prostate cancer Maternal Uncle    Immunization History  Administered Date(s)  Administered   PFIZER(Purple Top)SARS-COV-2 Vaccination 01/30/2020   Pneumococcal Polysaccharide-23 02/21/2004, 11/12/2011   Td 10/18/2004     Review of Systems: Negative except as noted in the HPI.   Objective: There were no vitals filed for this visit.  Cole Conrad is a pleasant 71 y.o. male in NAD. AAO X 3.  Vascular Examination: Capillary refill time to digits immediate b/l. Palpable pedal pulses b/l LE. Pedal hair absent. No pain with calf compression b/l. Lower extremity skin temperature gradient within normal limits. No edema noted b/l LE. No ischemia or gangrene noted b/l LE. No cyanosis or  clubbing noted b/l LE.  Dermatological Examination: Toenails 1-5 b/l elongated, discolored, dystrophic, thickened, crumbly with subungual debris and tenderness to dorsal palpation. Porokeratotic lesion(s) submet head 2 left foot. No erythema, no edema, no drainage, no fluctuance.  Neurological Examination: Protective sensation intact 5/5 intact bilaterally with 10g monofilament b/l. Vibratory sensation intact b/l.  Musculoskeletal Examination: Muscle strength 5/5 to all lower extremity muscle groups bilaterally. Hammertoe deformity noted 2-5 b/l. Patient ambulates independent of any assistive aids.  Footwear Assessment: Does the patient wear appropriate shoes? Yes. Does the patient need inserts/orthotics? Yes.  Assessment: 1. Pain due to onychomycosis of toenails of both feet   2. Porokeratosis   3. Acquired hammertoes of both feet   4. Uncontrolled type 2 diabetes mellitus with hyperglycemia ([place])   5. Encounter for diabetic foot exam ([place])     ADA Risk Categorization: Low Risk :  Patient has all of the following: Intact protective sensation No prior foot ulcer  No severe deformity Pedal pulses present  Plan: -Discussed dangers of using OTC corn/callus remover. Advised patient to never use these type products as they can lead to wound formation. He related  understanding. -Diabetic foot examination performed today. -Continue foot and shoe inspections daily. Monitor blood glucose per PCP/Endocrinologist's recommendations. -Mycotic toenails 1-5 bilaterally were debrided in length and girth with sterile nail nippers and dremel without incident. -Painful porokeratotic lesion(s) submet head 2 left foot pared and enucleated with sterile scalpel blade without incident. Total number of lesions debrided=1. -Patient/POA to call should there be question/concern in the interim. Return in about 3 months (around 09/19/2021).  Marzetta Board, DPM

## 2021-07-25 ENCOUNTER — Encounter: Payer: Self-pay | Admitting: Podiatry

## 2021-07-25 ENCOUNTER — Ambulatory Visit (INDEPENDENT_AMBULATORY_CARE_PROVIDER_SITE_OTHER): Payer: Medicare Other | Admitting: Podiatry

## 2021-07-25 DIAGNOSIS — E1165 Type 2 diabetes mellitus with hyperglycemia: Secondary | ICD-10-CM

## 2021-07-25 DIAGNOSIS — Q828 Other specified congenital malformations of skin: Secondary | ICD-10-CM | POA: Diagnosis not present

## 2021-07-31 NOTE — Progress Notes (Signed)
?  Subjective:  ?Patient ID: Cole Conrad, male    DOB: May 14, 1950,  MRN: [id number] ? ?Cole Conrad presents to clinic today for painful porokeratotic lesions .left foot  Pain prevent(s) comfortable ambulation. Aggravating factor is weightbearing with and without shoegear. ? ?Last HgA1c was 11.1%. Patient did not check blood glucose today. ? ?New problem(s): None.  ? ?PCP is Cipriano Mile, NP , and last visit was January, 2023. ? ?No Known Allergies ? ?Review of Systems: Negative except as noted in the HPI. ? ?Objective: No changes noted in today's physical examination. ?There were no vitals filed for this visit. ? ?Cole Conrad is a pleasant 71 y.o. male in NAD. AAO X 3. ? ?Vascular Examination: ?Capillary refill time to digits immediate b/l. Palpable pedal pulses b/l LE. Pedal hair absent. No pain with calf compression b/l. Lower extremity skin temperature gradient within normal limits. No edema noted b/l LE. No ischemia or gangrene noted b/l LE. No cyanosis or clubbing noted b/l LE. ? ?Dermatological Examination: ?Toenails 1-5 b/l appear to be recently debrided with no signs of trauma. Porokeratotic lesion(s) submet head 2 left foot. No erythema, no edema, no drainage, no fluctuance. ? ?Neurological Examination: ?Protective sensation intact 5/5 intact bilaterally with 10g monofilament b/l. Vibratory sensation intact b/l. ? ?Musculoskeletal Examination: ?Muscle strength 5/5 to all lower extremity muscle groups bilaterally. Hammertoe deformity noted 2-5 b/l. Patient ambulates independent of any assistive aids. ? ?Assessment/Plan: ?1. Porokeratosis   ?2. Uncontrolled type 2 diabetes mellitus with hyperglycemia ([place])   ?  ?-Patient was evaluated and treated. All patient's and/or POA's questions/concerns answered on today's visit. ?-Patient to continue soft, supportive shoe gear daily. ?-Painful porokeratotic lesion(s) submet head 2 left foot pared and enucleated with sterile scalpel blade without incident.  Total number of lesions debrided=1. ?-. ?-Patient/POA to call should there be question/concern in the interim.  ? ?Return in about 3 months (around 10/24/2021). ? ?Marzetta Board, DPM  ?

## 2021-09-19 ENCOUNTER — Ambulatory Visit (INDEPENDENT_AMBULATORY_CARE_PROVIDER_SITE_OTHER): Payer: Medicare Other | Admitting: Podiatry

## 2021-09-19 DIAGNOSIS — Q828 Other specified congenital malformations of skin: Secondary | ICD-10-CM

## 2021-09-19 DIAGNOSIS — E1165 Type 2 diabetes mellitus with hyperglycemia: Secondary | ICD-10-CM | POA: Diagnosis not present

## 2021-09-24 ENCOUNTER — Encounter: Payer: Self-pay | Admitting: Podiatry

## 2021-09-24 NOTE — Progress Notes (Signed)
  Subjective:  Patient ID: Cole Conrad, male    DOB: 31-Jan-1951,  MRN: [id number]  Cole Conrad presents to clinic today for preventative diabetic foot care and painful porokeratotic lesions right lower extremity. Pain prevent(s) comfortable ambulation. Aggravating factor is weightbearing with and without shoegear.  Last known HgA1c was 11.1%. Patient did not check blood glucose today.  New problem(s): None.   PCP is Cipriano Mile, NP , and last visit was April, 2023.  No Known Allergies  Review of Systems: Negative except as noted in the HPI.  Objective: No changes noted in today's physical examination. There were no vitals filed for this visit.  Cole Conrad is a pleasant 71 y.o. male in NAD. AAO X 3.  Vascular Examination: Capillary refill time to digits immediate b/l. Palpable pedal pulses b/l LE. Pedal hair absent. No pain with calf compression b/l. Lower extremity skin temperature gradient within normal limits. No edema noted b/l LE. No ischemia or gangrene noted b/l LE. No cyanosis or clubbing noted b/l LE.  Dermatological Examination: Toenails 1-5 b/l appear to be recently debrided with no signs of trauma. Porokeratotic lesion(s) submet head 2 left foot. No erythema, no edema, no drainage, no fluctuance.  Neurological Examination: Protective sensation intact 5/5 intact bilaterally with 10g monofilament b/l. Vibratory sensation intact b/l.  Musculoskeletal Examination: Muscle strength 5/5 to all lower extremity muscle groups bilaterally. Hammertoe deformity noted 2-5 b/l. Patient ambulates independent of any assistive aids.  Assessment/Plan: 1. Porokeratosis   2. Uncontrolled type 2 diabetes mellitus with hyperglycemia ([place])     -Patient was evaluated and treated. All patient's and/or POA's questions/concerns answered on today's visit. -No new findings. No new orders. -Stressed the importance of good glycemic control and the detriment of not  controlling  glucose levels in relation to the foot. -Patient to continue soft, supportive shoe gear daily. -Porokeratotic lesion(s) submet head 2 left foot pared and enucleated with sterile scalpel blade without incident. Total number of lesions debrided=1. -Patient/POA to call should there be question/concern in the interim.   Return in about 3 months (around 12/20/2021).  Marzetta Board, DPM

## 2021-12-27 ENCOUNTER — Encounter: Payer: Self-pay | Admitting: Podiatry

## 2021-12-27 ENCOUNTER — Ambulatory Visit (INDEPENDENT_AMBULATORY_CARE_PROVIDER_SITE_OTHER): Payer: Medicare Other | Admitting: Podiatry

## 2021-12-27 DIAGNOSIS — L84 Corns and callosities: Secondary | ICD-10-CM | POA: Diagnosis not present

## 2021-12-27 DIAGNOSIS — E1165 Type 2 diabetes mellitus with hyperglycemia: Secondary | ICD-10-CM

## 2021-12-27 NOTE — Progress Notes (Signed)
  Subjective:  Patient ID: Cole Conrad, male    DOB: 04/23/1950,  MRN: [id number]  71 y.o. male presents with painful porokeratotic lesions left lower extremity. Pain prevent(s) comfortable ambulation. Aggravating factor is weightbearing with and without shoegear..     Last known  HgA1c was 11.1%. Patient does not monitor blood glucose daily.  New problem(s): None    PCP: Cipriano Mile, NP and last visit was: July, 2023.  Review of Systems: Negative except as noted in the HPI.   No Known Allergies  Objective:  There were no vitals filed for this visit. Constitutional Patient is a pleasant 71 y.o. African American male WD, WN in NAD. AAO x 3.  Vascular Capillary fill time to digits immediate b/l.  DP/PT pulse(s) are palpable b/l lower extremities. Pedal hair sparse. Lower extremity skin temperature gradient within normal limits. No pain with calf compression b/l. No edema noted b/l lower extremities. No cyanosis or clubbing noted.   Neurologic Protective sensation intact 5/5 intact bilaterally with 10g monofilament b/l. Vibratory sensation intact b/l. No clonus b/l.   Dermatologic Pedal skin is warm and supple b/l.  No open wounds b/l lower extremities. No interdigital macerations b/l lower extremities. Toenails 1-5 b/l well maintained with adequate length. No erythema, no edema, no drainage, no fluctuance. Hyperkeratotic lesion(s) submet head 2 left foot and submet head 5 left foot.  No erythema, no edema, no drainage, no fluctuance.  Orthopedic: Normal muscle strength 5/5 to all lower extremity muscle groups bilaterally. Patient ambulates independent of any assistive aids. Hammertoe deformity noted 2-5 b/l.     Assessment:   1. Callus   2. Uncontrolled type 2 diabetes mellitus with hyperglycemia ([place])    Plan:  Patient was evaluated and treated and all questions answered. Consent given for treatment as described below: -Examined patient. -Stressed the importance of good glycemic  control and the detriment of not  controlling glucose levels in relation to the foot. -Patient to continue soft, supportive shoe gear daily. -Callus(es) submet head 2 left foot and submet head 5 left foot pared utilizing sterile scalpel blade without complication or incident. Total number debrided =2. -Patient/POA to call should there be question/concern in the interim.  Return in about 3 months (around 03/28/2022).  Marzetta Board, DPM

## 2022-03-30 ENCOUNTER — Ambulatory Visit: Payer: Medicare Other | Admitting: Podiatry

## 2022-04-04 ENCOUNTER — Encounter: Payer: Self-pay | Admitting: Podiatry

## 2022-04-04 ENCOUNTER — Ambulatory Visit (INDEPENDENT_AMBULATORY_CARE_PROVIDER_SITE_OTHER): Payer: Medicare Other | Admitting: Podiatry

## 2022-04-04 DIAGNOSIS — B351 Tinea unguium: Secondary | ICD-10-CM | POA: Diagnosis not present

## 2022-04-04 DIAGNOSIS — E1165 Type 2 diabetes mellitus with hyperglycemia: Secondary | ICD-10-CM

## 2022-04-04 DIAGNOSIS — M79675 Pain in left toe(s): Secondary | ICD-10-CM | POA: Diagnosis not present

## 2022-04-04 DIAGNOSIS — M79674 Pain in right toe(s): Secondary | ICD-10-CM

## 2022-04-04 NOTE — Progress Notes (Signed)
This patient returns to my office for at risk foot care.  This patient requires this care by a professional since this patient will be at risk due to having  diabetes.  This patient is unable to cut nails himself since the patient cannot reach his nails.These nails are painful walking and wearing shoes.  This patient presents for at risk foot care today.  General Appearance  Alert, conversant and in no acute stress.  Vascular  Dorsalis pedis and posterior tibial  pulses are palpable  bilaterally.  Capillary return is within normal limits  bilaterally. Temperature is within normal limits  bilaterally.  Neurologic  Senn-Weinstein monofilament wire test within normal limits  bilaterally. Muscle power within normal limits bilaterally.  Nails Thick disfigured discolored nails with subungual debris  from hallux to fifth toes bilaterally. No evidence of bacterial infection or drainage bilaterally.  Orthopedic  No limitations of motion  feet .  No crepitus or effusions noted.  No bony pathology or digital deformities noted.  Skin  normotropic skin with no porokeratosis noted bilaterally.  No signs of infections or ulcers noted.     Onychomycosis  Pain in right toes  Pain in left toes  Consent was obtained for treatment procedures.   Mechanical debridement of nails 1-5  bilaterally performed with a nail nipper.  Filed with dremel without incident.    Return office visit   3 months                   Told patient to return for periodic foot care and evaluation due to potential at risk complications.   Semir Brill DPM   

## 2023-01-14 ENCOUNTER — Ambulatory Visit (INDEPENDENT_AMBULATORY_CARE_PROVIDER_SITE_OTHER): Payer: 59 | Admitting: Podiatry

## 2023-01-14 DIAGNOSIS — M2041 Other hammer toe(s) (acquired), right foot: Secondary | ICD-10-CM

## 2023-01-14 DIAGNOSIS — M2042 Other hammer toe(s) (acquired), left foot: Secondary | ICD-10-CM | POA: Diagnosis not present

## 2023-01-14 DIAGNOSIS — E1165 Type 2 diabetes mellitus with hyperglycemia: Secondary | ICD-10-CM | POA: Diagnosis not present

## 2023-01-14 NOTE — Progress Notes (Signed)
This patient returns to my office for at risk foot care.  This patient requires this care by a professional since this patient will be at risk due to having diabetes.  This patient says he has severe pain noted in his fifth toe left foot.  He says the fifth toe is below his fourth toe left foot.  He says this causes pain to occur in fifth toe.  This patient presents for at risk foot care today.  General Appearance  Alert, conversant and in no acute stress.  Vascular  Dorsalis pedis and posterior tibial  pulses are palpable  bilaterally.  Capillary return is within normal limits  bilaterally. Temperature is within normal limits  bilaterally.  Neurologic  Senn-Weinstein monofilament wire test within normal limits  bilaterally. Muscle power within normal limits bilaterally.  Nails Thick disfigured discolored nails with subungual debris  from hallux to fifth toes bilaterally. No evidence of bacterial infection or drainage bilaterally.  Orthopedic  No limitations of motion  feet .  No crepitus or effusions noted.  No bony pathology or digital deformities noted. Underlapping fifth toe left foot.  Palpable pain at the medial aspect DI{PJ left fifth toe.   Skin  normotropic skin with no porokeratosis noted bilaterally.  No signs of infections or ulcers noted.     Pain due to hammer toe left foot.  Consent was obtained for treatment procedures.  Discussed this condition with this patient.   Discussed conservative treatment vs.  Surgical treatment.  Padding dispensed.  RTC prn   Return office visit    3 months                  Told patient to return for periodic foot care and evaluation due to potential at risk complications.   Helane Gunther DPM

## 2023-02-05 ENCOUNTER — Ambulatory Visit (INDEPENDENT_AMBULATORY_CARE_PROVIDER_SITE_OTHER): Payer: 59

## 2023-02-05 ENCOUNTER — Ambulatory Visit (INDEPENDENT_AMBULATORY_CARE_PROVIDER_SITE_OTHER): Payer: 59 | Admitting: Podiatry

## 2023-02-05 DIAGNOSIS — M79671 Pain in right foot: Secondary | ICD-10-CM | POA: Diagnosis not present

## 2023-02-05 DIAGNOSIS — M76821 Posterior tibial tendinitis, right leg: Secondary | ICD-10-CM | POA: Diagnosis not present

## 2023-02-05 DIAGNOSIS — L84 Corns and callosities: Secondary | ICD-10-CM | POA: Diagnosis not present

## 2023-02-05 DIAGNOSIS — E1165 Type 2 diabetes mellitus with hyperglycemia: Secondary | ICD-10-CM

## 2023-02-05 DIAGNOSIS — M2041 Other hammer toe(s) (acquired), right foot: Secondary | ICD-10-CM

## 2023-02-05 DIAGNOSIS — M2042 Other hammer toe(s) (acquired), left foot: Secondary | ICD-10-CM

## 2023-02-05 MED ORDER — DICLOFENAC SODIUM 1 % EX GEL: 4.0000 g | Freq: Four times a day (QID) | CUTANEOUS | 4 refills | Status: AC

## 2023-02-05 NOTE — Progress Notes (Signed)
  Subjective:  Patient ID: Cole Conrad, male    DOB: 1950/09/23,  MRN: [id number]  No chief complaint on file.   Discussed the use of AI scribe software for clinical note transcription with the patient, who gave verbal consent to proceed.  History of Present Illness   The patient, with a history of diabetes, presents with a painful corn on the left foot and tendon pain. The pain is described as a stretching or pulling sensation, particularly when standing and putting weight on the front of the foot. The patient also reports a sharp pain, which he initially thought was due to fallen arches. The patient also inquires about qualifying for new shoes through his Medicare/Medicaid plan. The patient has previously undergone rotator cuff surgery and is familiar with physical therapy.          Objective:    Physical Exam   EXTREMITIES: Warm and well-perfused feet with palpable pulses on both feet. Normal capillary fill time. Digital contractures with adduction and rotation of the fifth toe bilaterally. Painful corn on the medial side of the left fifth toe. Pain on palpation due to the insertion of the posterior tibial tendon at the navicular and along the tendon course to the retromalleolar groove. No evidence of tendon rupture or fracture.       No images are attached to the encounter.    Results   Procedure: Corns shaving Description: Debrided the corn on the medial side of the left fifth toe.  RADIOLOGY Foot Radiographs: No fracture, dislocation. Mild midfoot arthritis. (02/05/2023)      Assessment:   1. Posterior tibial tendon dysfunction (PTTD) of right lower extremity   2. Right foot pain   3. Acquired hammertoes of both feet   4. Callus   5. Uncontrolled type 2 diabetes mellitus with hyperglycemia ([facility])      Plan:  Patient was evaluated and treated and all questions answered.  Assessment and Plan    Posterior Tibial Tendonitis   He exhibits pain along the posterior  tibial tendon, likely from overstretching and insufficient support. We will prescribe an ankle brace to support and alleviate tendon stress. Physical therapy exercises are recommended twice daily. Voltaren gel (diclofenac) will be used four times daily for pain management. A follow-up appointment is scheduled in two months to evaluate progress.  Corn on Left Fifth Toe   He has a painful corn caused by friction between the fourth and fifth toes. The corn will be shaved down, and a silicone pad provided to prevent further friction and irritation.  Diabetic Footwear   He qualifies for diabetic shoes under Medicare/Medicaid. An appointment for shoe fitting is scheduled.          Return in about 2 months (around 04/07/2023) for f/u on PTTD right.

## 2023-02-05 NOTE — Patient Instructions (Signed)
More silicone pads can be purchased from:  [email or web address]    Posterior Tibial Tendinitis  Posterior tibial tendinitis is irritation of a tendon called the posterior tibial tendon. Your posterior tibial tendon is a cord-like tissue that connects bones of your lower leg and foot to a muscle that: Supports your arch. Helps you raise up on your toes. Helps you turn your foot down and in. This condition causes foot and ankle pain. It can also lead to a flat foot. What are the causes? This condition is most often caused by repeated stress to the tendon (overuse injury). It can also be caused by a sudden injury that stresses the tendon, such as landing on your foot after jumping or falling. What increases the risk? This condition is more likely to develop in: People who play a sport that involves putting a lot of pressure on the feet, such as: Basketball. Tennis. Soccer. Hockey. Runners. Females who are older than 70 years of age and are overweight. People with diabetes. People with decreased foot stability. People with flat feet. What are the signs or symptoms? Symptoms include: Pain in the inner ankle. Pain at the arch of your foot. Pain that gets worse with running, walking, or standing. Swelling on the inside of your ankle and foot. Weakness in your ankle or foot. Inability to stand up on tiptoe. Flattening of the arch of your foot. How is this diagnosed? This condition may be diagnosed based on: Your symptoms. Your medical history. A physical exam. Tests, such as: X-ray. MRI. Ultrasound. How is this treated? This condition may be treated by: Putting ice to the injured area. Taking NSAIDs, such as ibuprofen, to reduce pain and swelling. Wearing a special shoe or shoe insert to support your arch (orthotic). Having physical therapy. Replacing high-impact exercise with low-impact exercise, such as swimming or cycling. If your symptoms do not improve  with these treatments, you may need to wear a splint, removable walking boot, or short leg cast for 6-8 weeks to keep your foot and ankle still (immobilized). Follow these instructions at home: If you have a cast, splint, or boot: Keep it clean and dry. Check the skin around it every day. Tell your health care provider about any concerns. If you have a cast: Do not stick anything inside it to scratch your skin. Doing that increases your risk of infection. You may put lotion on dry skin around the edges of the cast. Do not put lotion on the skin underneath the cast. If you have a splint or boot: Wear it as told by your health care provider. Remove it only as told by your health care provider. Loosen it if your toes tingle, become numb, or turn cold and blue. Bathing Do not take baths, swim, or use a hot tub until your health care provider approves. Ask your health care provider if you may take showers. If your cast, splint, or boot is not waterproof: Do not let it get wet. Cover it with a waterproof covering while you take a bath or a shower. Managing pain and swelling   If directed, put ice on the injured area. If you have a removable splint or boot, remove it as told by your health care provider. Put ice in a plastic bag. Place a towel between your skin and the bag or between your cast and the bag. Leave the ice on for 20 minutes, 2-3 times a day. Move your toes often to reduce stiffness and swelling. Raise (elevate) the  injured area above the level of your heart while you are sitting or lying down. Activity Do not use the injured foot to support your body weight until your health care provider says that you can. Use crutches as told by your health care provider. Do not do activities that make pain or swelling worse. Ask your health care provider when it is safe to drive if you have a cast, splint, or boot on your foot. Return to your normal activities as told by your health care  provider. Ask your health care provider what activities are safe for you. Do exercises as told by your health care provider. General instructions Take over-the-counter and prescription medicines only as told by your health care provider. If you have an orthotic, use it as told by your health care provider. Keep all follow-up visits as told by your health care provider. This is important. How is this prevented? Wear footwear that is appropriate to your athletic activity. Avoid athletic activities that cause pain or swelling in your ankle or foot. Before being active, do range-of-motion and stretching exercises. If you develop pain or swelling while training, stop training. If you have pain or swelling that does not improve after a few days of rest, see your health care provider. If you start a new athletic activity, start gradually so you can build up your strength and flexibility. Contact a health care provider if: Your symptoms get worse. Your symptoms do not improve in 6-8 weeks. You develop new, unexplained symptoms. Your splint, boot, or cast gets damaged. Summary Posterior tibial tendinitis is irritation of a tendon called the posterior tibial tendon. This condition is most often caused by repeated stress to the tendon (overuse injury). This condition causes foot pain and ankle pain. It can also lead to a flat foot. This condition may be treated by not doing high-impact activities, applying ice, having physical therapy, wearing orthotics, and wearing a cast, splint, or boot if needed. This information is not intended to replace advice given to you by your health care provider. Make sure you discuss any questions you have with your health care provider. Document Revised: 07/29/2018 Document Reviewed: 06/05/2018 Elsevier Patient Education  2020 [employer]  Posterior Tibial Tendinitis Rehab Ask your health care provider which exercises are safe for you. Do exercises exactly as told  by your health care provider and adjust them as directed. It is normal to feel mild stretching, pulling, tightness, or discomfort as you do these exercises. Stop right away if you feel sudden pain or your pain gets worse. Do not begin these exercises until told by your health care provider. Stretching and range-of-motion exercises These exercises warm up your muscles and joints and improve the movement and flexibility in your ankle and foot. These exercises may also help to relieve pain. Standing wall calf stretch, knee straight   Stand with your hands against a wall. Extend your left / right leg behind you, and bend your front knee slightly. If directed, place a folded washcloth under the arch of your foot for support. Point the toes of your back foot slightly inward. Keeping your heels on the floor and your back knee straight, shift your weight toward the wall. Do not allow your back to arch. You should feel a gentle stretch in your upper left / right calf. Hold this position for 10 seconds. Repeat 10 times. Complete this exercise 2 times a day. Standing wall calf stretch, knee bent Stand with your hands against a wall.  Extend your left / right leg behind you, and bend your front knee slightly. If directed, place a folded washcloth under the arch of your foot for support. Point the toes of your back foot slightly inward. Unlock your back knee so it is bent. Keep your heels on the floor. You should feel a gentle stretch deep in your lower left / right calf. Hold this position for 10 seconds. Repeat 10 times. Complete this exercise 2 times a day. Strengthening exercises These exercises build strength and endurance in your ankle and foot. Endurance is the ability to use your muscles for a long time, even after they get tired. Ankle inversion with band Secure one end of a rubber exercise band or tubing to a fixed object, such as a table leg or a pole, that will stay still when the band is  pulled. Loop the other end of the band around the middle of your left / right foot. Sit on the floor facing the object with your left / right leg extended. The band or tube should be slightly tense when your foot is relaxed. Leading with your big toe, slowly bring your left / right foot and ankle inward, toward your other foot (inversion). Hold this position for 10 seconds. Slowly return your foot to the starting position. Repeat 10 times. Complete this exercise 2 times a day. Towel curls   Sit in a chair on a non-carpeted surface, and put your feet on the floor. Place a towel in front of your feet. Keeping your heel on the floor, put your left / right foot on the towel. Pull the towel toward you by grabbing the towel with your toes and curling them under. Keep your heel on the floor while you do this. Let your toes relax. Grab the towel with your toes again. Keep going until the towel is completely underneath your foot. Repeat 10 times. Complete this exercise 2 times a day. Balance exercise This exercise improves or maintains your balance. Balance is important in preventing falls. Single leg stand Without wearing shoes, stand near a railing or in a doorway. You may hold on to the railing or door frame as needed for balance. Stand on your left / right foot. Keep your big toe down on the floor and try to keep your arch lifted. If balancing in this position is too easy, try the exercise with your eyes closed or while standing on a pillow. Hold this position for 10 seconds. Repeat 10 times. Complete this exercise 2 times a day. This information is not intended to replace advice given to you by your health care provider. Make sure you discuss any questions you have with your health care provider.

## 2023-02-27 ENCOUNTER — Ambulatory Visit: Payer: 59

## 2023-02-27 DIAGNOSIS — M76821 Posterior tibial tendinitis, right leg: Secondary | ICD-10-CM

## 2023-02-27 DIAGNOSIS — L84 Corns and callosities: Secondary | ICD-10-CM

## 2023-02-27 DIAGNOSIS — E1165 Type 2 diabetes mellitus with hyperglycemia: Secondary | ICD-10-CM

## 2023-02-27 DIAGNOSIS — M2041 Other hammer toe(s) (acquired), right foot: Secondary | ICD-10-CM

## 2023-02-27 NOTE — Progress Notes (Signed)
Patient presents to the office today for diabetic shoe and insole measuring.  Patient was measured with brannock device to determine size and width for 1 pair of extra depth shoes and foam casted for 3 pair of insoles.   Documentation of medical necessity will be sent to patient's treating diabetic doctor to verify and sign.   Patient's diabetic provider: Hillery Aldo NP [facility] street   Shoes and insoles will be ordered at that time and patient will be notified for an appointment for fitting when they arrive.   Shoe size (per patient): 9.5 Brannock measurement: 10.5 Patient shoe selection- Shoe choice:   [id number] / [id number] Shoe size ordered: 10.57M

## 2023-03-05 ENCOUNTER — Encounter: Payer: Self-pay | Admitting: Student

## 2023-03-06 ENCOUNTER — Other Ambulatory Visit: Payer: Self-pay | Admitting: Student

## 2023-03-06 DIAGNOSIS — M25519 Pain in unspecified shoulder: Secondary | ICD-10-CM

## 2023-03-06 DIAGNOSIS — G8929 Other chronic pain: Secondary | ICD-10-CM

## 2023-03-12 ENCOUNTER — Ambulatory Visit
Admission: RE | Admit: 2023-03-12 | Discharge: 2023-03-12 | Disposition: A | Discharge status: Home / Self Care | Payer: 59 | Source: Ambulatory Visit | Attending: Student

## 2023-03-12 ENCOUNTER — Other Ambulatory Visit: Payer: Self-pay | Admitting: Student

## 2023-03-12 DIAGNOSIS — R1909 Other intra-abdominal and pelvic swelling, mass and lump: Secondary | ICD-10-CM

## 2023-04-07 ENCOUNTER — Other Ambulatory Visit: Payer: 59

## 2023-04-15 ENCOUNTER — Ambulatory Visit: Payer: 59 | Admitting: Podiatry

## 2023-04-27 ENCOUNTER — Other Ambulatory Visit: Payer: 59

## 2023-04-30 ENCOUNTER — Ambulatory Visit (INDEPENDENT_AMBULATORY_CARE_PROVIDER_SITE_OTHER): Payer: 59 | Admitting: Podiatry

## 2023-04-30 ENCOUNTER — Encounter: Payer: Self-pay | Admitting: Podiatry

## 2023-04-30 VITALS — Ht 68.0 in | Wt 185.0 lb

## 2023-04-30 DIAGNOSIS — L84 Corns and callosities: Secondary | ICD-10-CM

## 2023-04-30 DIAGNOSIS — E1165 Type 2 diabetes mellitus with hyperglycemia: Secondary | ICD-10-CM

## 2023-04-30 NOTE — Progress Notes (Signed)
  Subjective:  Patient ID: Cole Conrad, male    DOB: 25-Sep-1950,  MRN: [id number]  Chief Complaint  Patient presents with   Posterior tibial tendon dysfunction    He reports that he thinks he may have a callus on the 3rd and 4th toe on the left foot. He is also inquiring about his diabetic shoes as well. They are not back in yet.       History of Present Illness   Corn has been more painful      Objective:    Physical Exam   EXTREMITIES: Warm and well-perfused feet with palpable pulses on both feet. Normal capillary fill time. Digital contractures with adduction and rotation of the fifth toe bilaterally. Painful corn on the medial side of the left fifth toe and the lateral side of the fourth toe today.       No images are attached to the encounter.    Results   Procedure: Corns shaving Description: Debrided the corn on the medial side of the left fifth toe.  RADIOLOGY Foot Radiographs: No fracture, dislocation. Mild midfoot arthritis. (02/05/2023)      Assessment:   Encounter Diagnoses  Name Primary?   Callus Yes   Uncontrolled type 2 diabetes mellitus with hyperglycemia (HCC)       Plan:  Patient was evaluated and treated and all questions answered.  Assessment and Plan   Corn on Left fourth and fifth Toe   All symptomatic hyperkeratoses were safely debrided with a sterile #15 blade to patient's level of comfort without incident. We discussed preventative and palliative care of these lesions including supportive and accommodative shoegear, padding, prefabricated and custom molded accommodative orthoses, use of a pumice stone and lotions/creams daily.       Return in about 2 months (around 04/07/2023) for f/u on PTTD right.

## 2023-05-10 ENCOUNTER — Telehealth: Payer: Self-pay

## 2023-05-10 NOTE — Telephone Encounter (Signed)
Spoke with pt about his shoes, told him that his first choice was on backorder till April he decided to go with his second option.

## 2023-05-11 ENCOUNTER — Ambulatory Visit
Admission: RE | Admit: 2023-05-11 | Discharge: 2023-05-11 | Disposition: A | Discharge status: Home / Self Care | Payer: 59 | Source: Ambulatory Visit | Attending: Student | Admitting: Student

## 2023-05-11 DIAGNOSIS — G8929 Other chronic pain: Secondary | ICD-10-CM

## 2023-05-11 DIAGNOSIS — M25519 Pain in unspecified shoulder: Secondary | ICD-10-CM

## 2023-06-04 ENCOUNTER — Other Ambulatory Visit: Payer: Self-pay | Admitting: Nurse Practitioner

## 2023-06-04 DIAGNOSIS — E278 Other specified disorders of adrenal gland: Secondary | ICD-10-CM

## 2023-06-17 ENCOUNTER — Telehealth: Payer: Self-pay

## 2023-06-17 NOTE — Telephone Encounter (Signed)
 1st choice of DM shoe is on B/O emailed [employer] to request 2nd choice shoe [id number] 10.5 MED

## 2023-06-21 ENCOUNTER — Telehealth: Payer: Self-pay

## 2023-06-21 NOTE — Telephone Encounter (Signed)
 Spoke with Cole Conrad on [employer], in the email they sent they are out of [id number] and suggested they have p9100 m 10.5 med.

## 2023-07-02 ENCOUNTER — Ambulatory Visit
Admission: RE | Admit: 2023-07-02 | Discharge: 2023-07-02 | Disposition: A | Discharge status: Home / Self Care | Payer: 59 | Source: Ambulatory Visit | Attending: Nurse Practitioner | Admitting: Nurse Practitioner

## 2023-07-02 DIAGNOSIS — E278 Other specified disorders of adrenal gland: Secondary | ICD-10-CM

## 2023-07-02 MED ORDER — IOPAMIDOL (ISOVUE-300) INJECTION 61%
100.0000 mL | Freq: Once | INTRAVENOUS | Status: AC | PRN
Start: 2023-07-02 — End: 2023-07-02
  Administered 2023-07-02: 100 mL via INTRAVENOUS

## 2023-07-04 ENCOUNTER — Other Ambulatory Visit: Payer: Self-pay | Admitting: Student

## 2023-07-04 DIAGNOSIS — M25519 Pain in unspecified shoulder: Secondary | ICD-10-CM

## 2023-07-29 ENCOUNTER — Other Ambulatory Visit

## 2023-09-20 ENCOUNTER — Other Ambulatory Visit

## 2023-10-23 ENCOUNTER — Encounter (HOSPITAL_COMMUNITY): Payer: Self-pay

## 2023-10-23 ENCOUNTER — Emergency Department (HOSPITAL_COMMUNITY)
Admission: EM | Admit: 2023-10-23 | Discharge: 2023-10-23 | Disposition: A | Discharge status: Home / Self Care | Attending: Emergency Medicine | Admitting: Emergency Medicine

## 2023-10-23 ENCOUNTER — Other Ambulatory Visit: Payer: Self-pay

## 2023-10-23 DIAGNOSIS — Z7982 Long term (current) use of aspirin: Secondary | ICD-10-CM | POA: Insufficient documentation

## 2023-10-23 DIAGNOSIS — K4090 Unilateral inguinal hernia, without obstruction or gangrene, not specified as recurrent: Secondary | ICD-10-CM

## 2023-10-23 DIAGNOSIS — K4091 Unilateral inguinal hernia, without obstruction or gangrene, recurrent: Secondary | ICD-10-CM | POA: Insufficient documentation

## 2023-10-23 NOTE — Discharge Instructions (Addendum)
 Follow-up with your surgeons office.  Continue to work with your PCP to get your A1c under control.  Return for emergent symptoms.

## 2023-10-23 NOTE — ED Triage Notes (Signed)
 Pt reports with right sided inguinal hernia pain. Pt states that he is good right now and that his pain is a 3. Pt states that this has been going on since January. Pt reports bulging.

## 2023-10-23 NOTE — ED Provider Notes (Signed)
 [place] EMERGENCY DEPARTMENT AT [facility] Provider Note   CSN: [id number] Arrival date & time: 10/23/23  9158     Patient presents with: Inguinal Hernia   Cole Conrad is a 73 y.o. male.   73 year old male presents today for concern of right inguinal hernia.  This has been present since January.  Denies any pain.  He states he came in today to see if we could do anything on a more expedited fashion than outpatient.  He saw general surgery in January and there was plans to do surgery however his A1c had climbed since then so his surgery has been postponed.  He expresses frustration towards his PCP for poor control of his A1c.  He denies any pain.  Denies dysuria, abdominal pain, nausea, vomiting.  The history is provided by the patient. No language interpreter was used.       Prior to Admission medications   Medication Sig Start Date End Date Taking? Authorizing Provider  ACCU-CHEK AVIVA PLUS test strip daily as needed. 05/04/20   [provider]  Accu-Chek Softclix Lancets lancets Accu-Chek Softclix Lancets    [provider]  aspirin  81 MG chewable tablet Chew 81 mg by mouth daily.    [provider]  atorvastatin (LIPITOR) 20 MG tablet atorvastatin 20 mg tablet    [provider]  Blood Glucose Monitoring Suppl (ACCU-CHEK GUIDE) w/Device KIT USE AS DIRECTED TO CHECK BLOOD SUGAR ONCE DAILY 06/16/20   [provider]  Cholecalciferol (VITAMIN D3) 5000 UNITS CAPS Take 5,000 Units by mouth daily.    [provider]  cyclobenzaprine  (FLEXERIL ) 10 MG tablet Take 10 mg by mouth at bedtime. 10/25/21   [provider]  diclofenac  Sodium (VOLTAREN ) 1 % GEL Apply 4 g topically 4 (four) times daily. 02/05/23   McDonald, Adam R, DPM  glipiZIDE (GLUCOTROL) 10 MG tablet glipizide 10 mg tablet    [provider]  glucose blood test strip Accu-Chek Aviva Plus test strips    [provider]   HYDROcodone -acetaminophen  (NORCO/VICODIN) 5-325 MG tablet Take 1 tablet by mouth every 6 (six) hours as needed. 11/21/20   [provider]  ibuprofen (ADVIL) 800 MG tablet Take 800 mg by mouth every 8 (eight) hours as needed. 12/02/21   [provider]  JARDIANCE 10 MG TABS tablet Take 10 mg by mouth daily. 08/08/21   [provider]  ketoconazole  (NIZORAL ) 2 % cream Apply to affected area of both feet once daily for four weeks. 05/23/20   Gaynel Delon CROME, DPM  metFORMIN (GLUCOPHAGE) 1000 MG tablet     [provider]  MOUNJARO 5 MG/0.5ML Pen Inject into the skin. 09/17/21   [provider]  Multiple Vitamin (MULTIVITAMIN WITH MINERALS) TABS Take 1 tablet by mouth daily.    [provider]  nabumetone (RELAFEN) 750 MG tablet Take 750 mg by mouth 2 (two) times daily as needed. 07/09/20   [provider]  nitrofurantoin, macrocrystal-monohydrate, (MACROBID) 100 MG capsule Take 100 mg by mouth 2 (two) times daily. 07/17/21   [provider]  ondansetron  (ZOFRAN ) 4 MG tablet Take 4 mg by mouth every 8 (eight) hours as needed. 08/07/21   [provider]  pantoprazole  (PROTONIX ) 20 MG tablet pantoprazole  20 mg tablet,delayed release    [provider]  sulfamethoxazole -trimethoprim  (BACTRIM  DS) 800-160 MG tablet Take 1 tablet by mouth 2 (two) times daily. 07/11/21   [provider]  tamsulosin  (FLOMAX ) 0.4 MG CAPS capsule Take  0.4 mg by mouth daily after supper.    [provider]  triamcinolone  ointment (KENALOG ) 0.1 %  09/15/18   [provider]  TRULICITY 1.5 MG/0.5ML SOPN  10/03/18   [provider]  TRULICITY 3 MG/0.5ML SOPN SMARTSIG:0.5 Milliliter(s) SUB-Q Once a Week 01/24/21   [provider]    Allergies: Patient has no known allergies.    Review of Systems  Constitutional:  Negative for fever.  Gastrointestinal:  Negative for abdominal pain, nausea and vomiting.   Skin:  Negative for wound.  All other systems reviewed and are negative.   Updated Vital Signs BP 137/81 (BP Location: Left Arm)   Pulse (!) 52   Temp 98.6 F (37 C) (Oral)   Resp 16   SpO2 100%   Physical Exam Vitals and nursing note reviewed.  Constitutional:      General: He is not in acute distress.    Appearance: Normal appearance. He is not ill-appearing.  HENT:     Head: Normocephalic and atraumatic.     Nose: Nose normal.  Eyes:     Conjunctiva/sclera: Conjunctivae normal.  Cardiovascular:     Rate and Rhythm: Normal rate and regular rhythm.  Pulmonary:     Effort: Pulmonary effort is normal. No respiratory distress.  Abdominal:     Comments: Large inguinal hernia appreciated.  Easily reproducible.  Musculoskeletal:        General: No deformity. Normal range of motion.     Cervical back: Normal range of motion.  Skin:    Findings: No rash.  Neurological:     Mental Status: He is alert.     (all labs ordered are listed, but only abnormal results are displayed) Labs Reviewed - No data to display  EKG: None  Radiology: No results found.   Procedures   Medications Ordered in the ED - No data to display                                  Medical Decision Making  73 year old male presents today for concern of right inguinal hernia.  Easily reducible on exam.  No tenderness palpation.  No abdominal pain, nausea, vomiting, fever. He came into the emergency department see if there was an expedited way of getting this taken care of.  I did have sidebar discussion with general surgery.  Discussed his case with him.  They agree that there is no acute intervention needed.  They recommended for him to call the office to set up an appointment.  Discussed with patient.  He is agreeable with this.  Strict return precautions given for any signs of strangulation or incarceration.  Patient discharged in stable condition.  No urinary complaints. Discussed with  attending.    Final diagnoses:  Right inguinal hernia    ED Discharge Orders     None          Hildegard Loge, [place] 10/23/23 1132    Dreama Longs, MD 10/24/23 [place]

## 2023-10-24 ENCOUNTER — Other Ambulatory Visit

## 2023-12-13 ENCOUNTER — Other Ambulatory Visit

## 2024-01-09 ENCOUNTER — Other Ambulatory Visit

## 2024-02-11 ENCOUNTER — Encounter (HOSPITAL_COMMUNITY): Payer: Self-pay | Admitting: Emergency Medicine

## 2024-02-11 ENCOUNTER — Inpatient Hospital Stay: Admit: 2024-02-11 | Admitting: Surgery

## 2024-02-11 ENCOUNTER — Emergency Department (HOSPITAL_COMMUNITY)

## 2024-02-11 ENCOUNTER — Other Ambulatory Visit: Payer: Self-pay

## 2024-02-11 ENCOUNTER — Observation Stay (HOSPITAL_COMMUNITY)
Admission: EM | Admit: 2024-02-11 | Discharge: 2024-02-12 | Disposition: A | Discharge status: Home / Self Care | Attending: Surgery | Admitting: Surgery

## 2024-02-11 DIAGNOSIS — K3531 Acute appendicitis with localized peritonitis and gangrene, without perforation: Secondary | ICD-10-CM

## 2024-02-11 DIAGNOSIS — J4520 Mild intermittent asthma, uncomplicated: Secondary | ICD-10-CM | POA: Insufficient documentation

## 2024-02-11 DIAGNOSIS — Z87891 Personal history of nicotine dependence: Secondary | ICD-10-CM | POA: Diagnosis not present

## 2024-02-11 DIAGNOSIS — K3580 Unspecified acute appendicitis: Principal | ICD-10-CM | POA: Diagnosis present

## 2024-02-11 DIAGNOSIS — E279 Disorder of adrenal gland, unspecified: Secondary | ICD-10-CM

## 2024-02-11 DIAGNOSIS — E119 Type 2 diabetes mellitus without complications: Secondary | ICD-10-CM | POA: Insufficient documentation

## 2024-02-11 DIAGNOSIS — Z8546 Personal history of malignant neoplasm of prostate: Secondary | ICD-10-CM | POA: Diagnosis not present

## 2024-02-11 DIAGNOSIS — K4090 Unilateral inguinal hernia, without obstruction or gangrene, not specified as recurrent: Secondary | ICD-10-CM | POA: Insufficient documentation

## 2024-02-11 DIAGNOSIS — R1031 Right lower quadrant pain: Secondary | ICD-10-CM | POA: Diagnosis present

## 2024-02-11 DIAGNOSIS — K4091 Unilateral inguinal hernia, without obstruction or gangrene, recurrent: Secondary | ICD-10-CM

## 2024-02-11 DIAGNOSIS — Z7984 Long term (current) use of oral hypoglycemic drugs: Secondary | ICD-10-CM | POA: Diagnosis not present

## 2024-02-11 DIAGNOSIS — K35891 Other acute appendicitis without perforation, with gangrene: Principal | ICD-10-CM | POA: Insufficient documentation

## 2024-02-11 LAB — CBC
HCT: 39.8 % (ref 39.0–52.0)
Hemoglobin: 12.9 g/dL — ABNORMAL LOW (ref 13.0–17.0)
MCH: 29.7 pg (ref 26.0–34.0)
MCHC: 32.4 g/dL (ref 30.0–36.0)
MCV: 91.7 fL (ref 80.0–100.0)
Platelets: 307 K/uL (ref 150–400)
RBC: 4.34 MIL/uL (ref 4.22–5.81)
RDW: 12.7 % (ref 11.5–15.5)
WBC: 7.9 K/uL (ref 4.0–10.5)
nRBC: 0 % (ref 0.0–0.2)

## 2024-02-11 LAB — COMPREHENSIVE METABOLIC PANEL WITH GFR
ALT: 15 U/L (ref 0–44)
AST: 14 U/L — ABNORMAL LOW (ref 15–41)
Albumin: 4.1 g/dL (ref 3.5–5.0)
Alkaline Phosphatase: 74 U/L (ref 38–126)
Anion gap: 12 (ref 5–15)
BUN: 13 mg/dL (ref 8–23)
CO2: 23 mmol/L (ref 22–32)
Calcium: 9.2 mg/dL (ref 8.9–10.3)
Chloride: 98 mmol/L (ref 98–111)
Creatinine, Ser: 0.93 mg/dL (ref 0.61–1.24)
GFR, Estimated: >60 mL/min (ref >60)
Glucose, Bld: 407 mg/dL — ABNORMAL HIGH (ref 70–99)
Potassium: 4.3 mmol/L (ref 3.5–5.1)
Sodium: 133 mmol/L — ABNORMAL LOW (ref 135–145)
Total Bilirubin: 0.5 mg/dL (ref 0.0–1.2)
Total Protein: 7.4 g/dL (ref 6.5–8.1)

## 2024-02-11 LAB — CBC WITH DIFFERENTIAL/PLATELET
Abs Immature Granulocytes: 0.03 K/uL (ref 0.00–0.07)
Basophils Absolute: 0 K/uL (ref 0.0–0.1)
Basophils Relative: 1 %
Eosinophils Absolute: 0.1 K/uL (ref 0.0–0.5)
Eosinophils Relative: 1 %
HCT: 41 % (ref 39.0–52.0)
Hemoglobin: 13.2 g/dL (ref 13.0–17.0)
Immature Granulocytes: 0 %
Lymphocytes Relative: 15 %
Lymphs Abs: 1.2 K/uL (ref 0.7–4.0)
MCH: 29.3 pg (ref 26.0–34.0)
MCHC: 32.2 g/dL (ref 30.0–36.0)
MCV: 91.1 fL (ref 80.0–100.0)
Monocytes Absolute: 0.7 K/uL (ref 0.1–1.0)
Monocytes Relative: 9 %
Neutro Abs: 5.9 K/uL (ref 1.7–7.7)
Neutrophils Relative %: 74 %
Platelets: 326 K/uL (ref 150–400)
RBC: 4.5 MIL/uL (ref 4.22–5.81)
RDW: 12.8 % (ref 11.5–15.5)
WBC: 7.9 K/uL (ref 4.0–10.5)
nRBC: 0 % (ref 0.0–0.2)

## 2024-02-11 LAB — URINALYSIS, ROUTINE W REFLEX MICROSCOPIC
Bilirubin Urine: NEGATIVE
Glucose, UA: >=500 mg/dL — AB
Hgb urine dipstick: NEGATIVE
Ketones, ur: NEGATIVE mg/dL
Leukocytes,Ua: NEGATIVE
Nitrite: NEGATIVE
Protein, ur: NEGATIVE mg/dL
Specific Gravity, Urine: 1.018 (ref 1.005–1.030)
pH: 5 (ref 5.0–8.0)

## 2024-02-11 LAB — LIPASE, BLOOD: Lipase: 19 U/L (ref 11–51)

## 2024-02-11 LAB — CREATININE, SERUM
Creatinine, Ser: 0.86 mg/dL (ref 0.61–1.24)
GFR, Estimated: >60 mL/min (ref >60)

## 2024-02-11 LAB — SURGICAL PCR SCREEN
MRSA, PCR: NEGATIVE
Staphylococcus aureus: NEGATIVE

## 2024-02-11 LAB — GLUCOSE, CAPILLARY
Glucose-Capillary: 210 mg/dL — ABNORMAL HIGH (ref 70–99)
Glucose-Capillary: 105 mg/dL — ABNORMAL HIGH (ref 70–99)

## 2024-02-11 MED ORDER — METHOCARBAMOL 1000 MG/10ML IJ SOLN: 500.0000 mg | Freq: Three times a day (TID) | INTRAMUSCULAR | Status: DC | PRN

## 2024-02-11 MED ORDER — DOCUSATE SODIUM 100 MG PO CAPS
100.0000 mg | ORAL_CAPSULE | Freq: Two times a day (BID) | ORAL | Status: DC
  Administered 2024-02-11: 100 mg via ORAL
  Filled 2024-02-11 (×2): qty 1

## 2024-02-11 MED ORDER — IOHEXOL 300 MG/ML  SOLN
100.0000 mL | Freq: Once | INTRAMUSCULAR | Status: AC | PRN
  Administered 2024-02-11: 100 mL via INTRAVENOUS

## 2024-02-11 MED ORDER — DIPHENHYDRAMINE HCL 12.5 MG/5ML PO ELIX
12.5000 mg | ORAL_SOLUTION | Freq: Four times a day (QID) | ORAL | Status: DC | PRN
Start: 2024-02-11 — End: 2024-02-12

## 2024-02-11 MED ORDER — DIPHENHYDRAMINE HCL 50 MG/ML IJ SOLN: 12.5000 mg | Freq: Four times a day (QID) | INTRAMUSCULAR | Status: DC | PRN

## 2024-02-11 MED ORDER — INSULIN ASPART 100 UNIT/ML IJ SOLN
0.0000 [IU] | Freq: Three times a day (TID) | INTRAMUSCULAR | Status: DC
  Administered 2024-02-11: 5 [IU] via SUBCUTANEOUS

## 2024-02-11 MED ORDER — MUPIROCIN 2 % EX OINT: 1.0000 | TOPICAL_OINTMENT | Freq: Two times a day (BID) | CUTANEOUS | Status: DC

## 2024-02-11 MED ORDER — SODIUM CHLORIDE 0.9 % IV SOLN
2.0000 g | Freq: Once | INTRAVENOUS | Status: AC
  Administered 2024-02-11: 2 g via INTRAVENOUS
  Filled 2024-02-11: qty 20

## 2024-02-11 MED ORDER — METRONIDAZOLE IVPB CUSTOM: 30.0000 mg/kg | Freq: Once | INTRAVENOUS | Status: DC

## 2024-02-11 MED ORDER — SODIUM CHLORIDE 0.9 % IV BOLUS
500.0000 mL | Freq: Once | INTRAVENOUS | Status: AC
  Administered 2024-02-11: 500 mL via INTRAVENOUS

## 2024-02-11 MED ORDER — ONDANSETRON 4 MG PO TBDP: 4.0000 mg | ORAL_TABLET | Freq: Four times a day (QID) | ORAL | Status: DC | PRN

## 2024-02-11 MED ORDER — SIMETHICONE 80 MG PO CHEW: 40.0000 mg | CHEWABLE_TABLET | Freq: Four times a day (QID) | ORAL | Status: DC | PRN

## 2024-02-11 MED ORDER — ENOXAPARIN SODIUM 40 MG/0.4ML IJ SOSY
40.0000 mg | PREFILLED_SYRINGE | INTRAMUSCULAR | Status: DC
  Administered 2024-02-11: 40 mg via SUBCUTANEOUS
  Filled 2024-02-11: qty 0.4

## 2024-02-11 MED ORDER — METOPROLOL TARTRATE 5 MG/5ML IV SOLN: 5.0000 mg | Freq: Four times a day (QID) | INTRAVENOUS | Status: DC | PRN

## 2024-02-11 MED ORDER — ONDANSETRON HCL 4 MG/2ML IJ SOLN: 4.0000 mg | Freq: Four times a day (QID) | INTRAMUSCULAR | Status: DC | PRN

## 2024-02-11 MED ORDER — ACETAMINOPHEN 650 MG RE SUPP: 650.0000 mg | Freq: Four times a day (QID) | RECTAL | Status: DC | PRN

## 2024-02-11 MED ORDER — POLYETHYLENE GLYCOL 3350 17 G PO PACK: 17.0000 g | PACK | Freq: Every day | ORAL | Status: DC | PRN

## 2024-02-11 MED ORDER — PANTOPRAZOLE SODIUM 40 MG PO TBEC
40.0000 mg | DELAYED_RELEASE_TABLET | Freq: Every day | ORAL | Status: DC
  Administered 2024-02-11: 40 mg via ORAL
  Filled 2024-02-11: qty 1

## 2024-02-11 MED ORDER — SODIUM CHLORIDE 0.9 % IV SOLN: INTRAVENOUS | Status: DC

## 2024-02-11 MED ORDER — METRONIDAZOLE 500 MG/100ML IV SOLN
500.0000 mg | Freq: Once | INTRAVENOUS | Status: AC
  Administered 2024-02-11: 500 mg via INTRAVENOUS
  Filled 2024-02-11: qty 100

## 2024-02-11 MED ORDER — DEXTROSE 5 % IV SOLN: 50.0000 mg/kg | Freq: Once | INTRAVENOUS | Status: DC

## 2024-02-11 MED ORDER — HYDROMORPHONE HCL 1 MG/ML IJ SOLN
0.5000 mg | INTRAMUSCULAR | Status: DC | PRN
  Administered 2024-02-12: 0.5 mg via INTRAVENOUS
  Filled 2024-02-11: qty 0.5

## 2024-02-11 MED ORDER — OXYCODONE HCL 5 MG PO TABS
5.0000 mg | ORAL_TABLET | ORAL | Status: DC | PRN
  Administered 2024-02-11: 10 mg via ORAL
  Filled 2024-02-11 (×2): qty 2

## 2024-02-11 MED ORDER — ACETAMINOPHEN 325 MG PO TABS: 650.0000 mg | ORAL_TABLET | Freq: Four times a day (QID) | ORAL | Status: DC | PRN

## 2024-02-11 MED ORDER — METHOCARBAMOL 500 MG PO TABS
500.0000 mg | ORAL_TABLET | Freq: Three times a day (TID) | ORAL | Status: DC | PRN
  Administered 2024-02-11: 500 mg via ORAL
  Filled 2024-02-11: qty 1

## 2024-02-11 MED ORDER — ATORVASTATIN CALCIUM 20 MG PO TABS
20.0000 mg | ORAL_TABLET | Freq: Every day | ORAL | Status: DC
  Administered 2024-02-11: 20 mg via ORAL
  Filled 2024-02-11: qty 2

## 2024-02-11 MED ORDER — INSULIN ASPART 100 UNIT/ML IJ SOLN: 0.0000 [IU] | Freq: Every day | INTRAMUSCULAR | Status: DC

## 2024-02-11 MED ORDER — PIPERACILLIN-TAZOBACTAM 3.375 G IVPB
3.3750 g | Freq: Three times a day (TID) | INTRAVENOUS | Status: DC
  Administered 2024-02-11 – 2024-02-12 (×2): 3.375 g via INTRAVENOUS
  Filled 2024-02-11 (×2): qty 50

## 2024-02-11 NOTE — Plan of Care (Signed)
  Problem: Coping: Goal: Ability to adjust to condition or change in health will improve Outcome: Progressing   Problem: Fluid Volume: Goal: Ability to maintain a balanced intake and output will improve Outcome: Progressing   Problem: Health Behavior/Discharge Planning: Goal: Ability to manage health-related needs will improve Outcome: Progressing

## 2024-02-11 NOTE — ED Provider Notes (Signed)
 [facility] Provider Note  CSN: [id number] Arrival date & time: 02/11/24 9050  Chief Complaint(s) Abdominal Pain  HPI Cole Conrad is a 73 y.o. male with past medical history as below, significant for GERD, DM2, asthma, prostate cancer who presents to the ED with complaint of right lower quad abdominal pain  Patient has a right sided inguinal hernia, follows with intracolonic surgery, is due to have repair next month.  He felt as though his hernia became larger, he was able to reduce the hernia but is having some residual right side abdominal pain, bloating, pressure sensation.  Poor appetite last couple days, has been constipated for around a week, had small bowel movement this morning, brown, nonmelanotic.  No nausea or vomiting.  No fevers or chills, no chest pain or dyspnea.  Colace yesterday which seemed to help his symptoms.  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  1997 (15 YRS AGO)   STATES NO DRUG USE SINCE   History of head injury 2010-  CLOSED  W/ CONCUSSION--   NO RESIDUAL   Mild asthma SEASONAL -  PT HAS NO INHALER   Prostate cancer (HCC) 05/01/2011 BX   ADENOCARCINOMA,gLEASON= 3+3=6,VOLUNE=68.8CC ,PSA=7.40   Seasonal allergies    Patient Active Problem List   Diagnosis Date Noted   Acute appendicitis 02/11/2024   Pain in joint of left shoulder 04/04/2020   Pain in joint of left elbow 03/04/2020   ARF (acute renal failure) 06/27/2012   Leukocytosis 06/27/2012   Candiduria 06/27/2012   Obstructed Foley catheter 06/27/2012   Ileus (HCC) 11/15/2011   Fever 11/14/2011   Hypoglycemia associated  with diabetes (HCC) 11/11/2011   Anemia 11/11/2011   Acute renal failure 11/11/2011   Hyperkalemia 11/11/2011   Hyponatremia 11/11/2011   Urinary retention 11/11/2011   Rash 11/11/2011   Prostate cancer (HCC)    Arthritis    Hypertension    DM, UNCOMPLICATED, TYPE II, UNCONTROLLED 11/26/2006   DEGENERATIVE JOINT DISEASE, HIPS 11/26/2006   DEGENERATIVE JOINT DISEASE, KNEES, 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 Authorizing Provider  albuterol (VENTOLIN HFA) 108 (90 Base) MCG/ACT inhaler Inhale 2 puffs into the lungs every 6 (six) hours as needed for wheezing or shortness of breath.   Yes [provider]  atorvastatin (LIPITOR) 20 MG tablet Take 20 mg by mouth daily.   Yes [provider]  glipiZIDE (GLUCOTROL XL) 10 MG 24 hr tablet Take 20 mg by mouth daily. 12/19/23  Yes [provider]  JARDIANCE 25 MG TABS tablet Take 25 mg by mouth every morning. 01/15/24  Yes [provider]  metFORMIN (GLUCOPHAGE-XR) 500 MG 24 hr tablet Take 500 mg by mouth every morning.   Yes [provider]  MOUNJARO 10 MG/0.5ML Pen Inject 10 mg into the skin every Monday. 12/03/23  Yes [provider]  Multiple Vitamin (MULTIVITAMIN WITH MINERALS) TABS  Take 1 tablet by mouth daily with breakfast.   Yes [provider]  omeprazole (PRILOSEC) 40 MG capsule Take 40 mg by mouth daily before breakfast. 01/03/24  Yes [provider]  ondansetron  (ZOFRAN ) 4 MG tablet Take 4 mg by mouth every 8 (eight) hours as needed for nausea or vomiting. 08/07/21  Yes [provider]  Oxycodone  HCl 10 MG TABS Take 10 mg by mouth every 8 (eight) hours. 02/01/24  Yes [provider]  tamsulosin  (FLOMAX )  0.4 MG CAPS capsule Take 0.4 mg by mouth daily after breakfast.   Yes [provider]  triamcinolone  ointment (KENALOG ) 0.1 % Apply 1 Application topically 2 (two) times daily as needed (for itching- apply sparingly). 09/15/18  Yes [provider]  diclofenac  Sodium (VOLTAREN ) 1 % GEL Apply 4 g topically 4 (four) times daily. Patient not taking: Reported on 02/11/2024 02/05/23   Silva Juliene SAUNDERS, DPM  ketoconazole  (NIZORAL ) 2 % cream Apply to affected area of both feet once daily for four weeks. Patient not taking: Reported on 02/11/2024 05/23/20   Gaynel Delon CROME, DPM                                                                                                                                    Past Surgical History Past Surgical History:  Procedure Laterality Date   CYSTOSCOPY  09/07/2011   Procedure: CYSTOSCOPY;  Surgeon: Oneil JAYSON Rafter, MD;  Location: [facility];  Service: Urology;  Laterality: N/A;  no seeds noted in bladder   RADIOACTIVE SEED IMPLANT  09/07/2011   Procedure: RADIOACTIVE SEED IMPLANT;  Surgeon: Oneil JAYSON Rafter, MD;  Location: [facility];  Service: Urology;  Laterality: N/A;  84  seeds implanted   ROTATOR CUFF REPAIR  01-10-2004   RIGHT   Family History Family History  Problem Relation Age of Onset   Prostate cancer Maternal Uncle     Social History Social History   Tobacco Use   Smoking status: Former    Current packs/day: 0.00    Types: Cigarettes    Quit date: 09/02/1973    Years since quitting: 50.4   Smokeless tobacco: Never   Tobacco comments:    QUIT SMOKING 1970'S  Substance Use Topics   Alcohol use: No   Drug use: No    Comment: RECOVERING ADDICT  ([facility], [facility]) QUIT 1997  APPROX.  (15 YRS AGO)   Allergies Patient has no known allergies.  Review of Systems A thorough review of systems was obtained and all systems are negative except as noted in the HPI and PMH.   Physical Exam Vital Signs   I have reviewed the triage vital signs BP 119/77   Pulse (!) 56   Temp 98.2 F (36.8 C) (Oral)   Resp 18   Ht 5' 6 (1.676 m)   Wt 72.1 kg   SpO2 99%   BMI 25.66  kg/m  Physical Exam Vitals and nursing note reviewed.  Constitutional:      General: He is not in acute distress.    Appearance: Normal appearance. He is well-developed. He is not ill-appearing.  HENT:     Head: Normocephalic and atraumatic.     Right Ear: External ear normal.     Left Ear: External ear normal.     Nose: Nose normal.     Mouth/Throat:     Mouth: Mucous membranes are moist.  Eyes:     General: No scleral icterus.       Right eye: No discharge.        Left eye: No discharge.  Cardiovascular:     Rate and Rhythm: Normal rate.  Pulmonary:     Effort: Pulmonary effort is normal. No respiratory distress.     Breath sounds: No stridor.  Abdominal:     General: Abdomen is flat. There is no distension.     Tenderness: There is abdominal tenderness in the right lower quadrant. There is no guarding.      Comments: Reducible right inguinal hernia  Musculoskeletal:        General: No deformity.     Cervical back: No rigidity.  Skin:    General: Skin is warm and dry.     Coloration: Skin is not cyanotic, jaundiced or pale.  Neurological:     Mental Status: He is alert.  Psychiatric:        Speech: Speech normal.        Behavior: Behavior normal. Behavior is cooperative.     ED Results and Treatments Labs (all labs ordered are listed, but only abnormal results are displayed) Labs Reviewed  COMPREHENSIVE METABOLIC PANEL WITH GFR - Abnormal; Notable for the following components:      Result Value   Sodium 133 (*)    Glucose, Bld 407 (*)    AST 14 (*)    All other components within normal limits  URINALYSIS, ROUTINE W REFLEX MICROSCOPIC - Abnormal; Notable for the following components:   Glucose, UA >=500 (*)    Bacteria, UA RARE (*)    All other components within normal limits  CBC -  Abnormal; Notable for the following components:   Hemoglobin 12.9 (*)    All other components within normal limits  BASIC METABOLIC PANEL WITH GFR - Abnormal; Notable for the following components:   Glucose, Bld 101 (*)    All other components within normal limits  CBC - Abnormal; Notable for the following components:   RBC 4.07 (*)    Hemoglobin 12.3 (*)    HCT 37.4 (*)    All other components within normal limits  GLUCOSE, CAPILLARY - Abnormal; Notable for the following components:   Glucose-Capillary 210 (*)    All other components within normal limits  GLUCOSE, CAPILLARY - Abnormal; Notable for the following components:   Glucose-Capillary 105 (*)    All other components within normal limits  GLUCOSE, CAPILLARY - Abnormal; Notable for the following components:   Glucose-Capillary 130 (*)    All other components within normal limits  SURGICAL PCR SCREEN  CBC WITH DIFFERENTIAL/PLATELET  LIPASE, BLOOD  CREATININE, SERUM  MAGNESIUM   Radiology CT ABDOMEN PELVIS W CONTRAST Result Date: 02/11/2024 EXAM: CT ABDOMEN AND PELVIS WITH CONTRAST 02/11/2024 12:54:54 PM TECHNIQUE: CT of the abdomen and pelvis was performed with the administration of 100 mL of iohexol  (OMNIPAQUE ) 300 MG/ML solution. Multiplanar reformatted images are provided for review. Automated exposure control, iterative reconstruction, and/or weight-based adjustment of the mA/kV was utilized to reduce the radiation dose to as low as reasonably achievable. COMPARISON: 07/02/2023 CLINICAL HISTORY: RLQ abdominal pain. RLQ abd pain; Omnipaque  300; 100 cc ; reports RLQ abdominal pain that started 1 week ago. Pt reports he ws able to reduce his hernia about 1 week ago. States he has not had a BM since last Monday. FINDINGS: LOWER CHEST: No acute abnormality. LIVER: The liver is unremarkable. GALLBLADDER AND BILE  DUCTS: Gallbladder is unremarkable. No biliary ductal dilatation. SPLEEN: No acute abnormality. PANCREAS: No acute abnormality. ADRENAL GLANDS: 3.1 cm right adrenal mass is noted which is not significantly changed compared to prior exam. KIDNEYS, URETERS AND BLADDER: Left renal cortical scarring is noted. Renal cysts are noted. No stones in the kidneys or ureters. No hydronephrosis. No perinephric or periureteral stranding. Urinary bladder is unremarkable. GI AND BOWEL: The appendix is thickened with mucosal enhancement consistent with appendicitis. A 2.2 x 1.8 cm low density fluid collection is noted in the region of the appendix concerning for abscess or contained perforation. Large right inguinal hernia is noted which contains loop of small bowel, but does not result in obstruction. Stomach demonstrates no acute abnormality. There is no bowel obstruction. PERITONEUM AND RETROPERITONEUM: No ascites. No free air. VASCULATURE: Aorta is normal in caliber. LYMPH NODES: No lymphadenopathy. REPRODUCTIVE ORGANS: Status post prosthetic brachytherapy seed placement. BONES AND SOFT TISSUES: No acute osseous abnormality. No focal soft tissue abnormality. IMPRESSION: 1. Acute appendicitis with associated abscess and/or  contained perforation. 2. Large right inguinal hernia containing a loop of small bowel without obstruction. 3. Right adrenal mass measuring 3.1 cm, not significantly changed from prior; recommend chemical-shift MR for further characterization. Electronically signed by: Lynwood Seip MD 02/11/2024 01:30 PM EDT RP Workstation: [id number]    Pertinent labs & imaging results that were available during my care of the patient were reviewed by me and considered in my medical decision making (see MDM for details).  Medications Ordered in ED Medications  enoxaparin  (LOVENOX ) injection 40 mg ( Subcutaneous Automatically Held 02/27/24 1600)  0.9 %  sodium chloride  infusion ( Intravenous New Bag/Given 02/11/24 2114)   piperacillin -tazobactam (ZOSYN ) IVPB 3.375 g ( Intravenous Automatically Held 02/18/24 1400)  acetaminophen  (TYLENOL ) tablet 650 mg ( Oral MAR Hold 02/12/24 9362)    Or  acetaminophen  (TYLENOL ) suppository 650 mg ( Rectal MAR Hold 02/12/24 0637)  oxyCODONE  (Oxy IR/ROXICODONE ) immediate release tablet 5-10 mg ( Oral MAR Hold 02/12/24 0637)  HYDROmorphone (DILAUDID) injection 0.5 mg ( Intravenous MAR Hold 02/12/24 0637)  methocarbamol (ROBAXIN) tablet 500 mg ( Oral MAR Hold 02/12/24 0637)    Or  methocarbamol (ROBAXIN) injection 500 mg ( Intravenous MAR Hold 02/12/24 0637)  diphenhydrAMINE (BENADRYL) 12.5 MG/5ML elixir 12.5 mg ( Oral MAR Hold 02/12/24 0637)    Or  diphenhydrAMINE (BENADRYL) injection 12.5 mg ( Intravenous MAR Hold 02/12/24 0637)  docusate sodium  (COLACE) capsule 100 mg ( Oral Automatically Held 02/27/24 2200)  polyethylene glycol (MIRALAX / GLYCOLAX) packet 17 g ( Oral MAR Hold 02/12/24 0637)  ondansetron  (ZOFRAN -ODT) disintegrating tablet 4 mg ( Oral MAR Hold 02/12/24 9362)    Or  ondansetron  (ZOFRAN ) injection 4 mg ( Intravenous MAR  Hold 02/12/24 0637)  simethicone  (MYLICON) chewable tablet 40 mg ( Oral MAR Hold 02/12/24 0637)  metoprolol  tartrate (LOPRESSOR ) injection 5 mg ( Intravenous MAR Hold 02/12/24 0637)  insulin  aspart (novoLOG ) injection 0-15 Units ( Subcutaneous Automatically Held 02/27/24 1700)  insulin  aspart (novoLOG ) injection 0-5 Units ( Subcutaneous Automatically Held 02/27/24 2200)  atorvastatin (LIPITOR) tablet 20 mg ( Oral Automatically Held 02/27/24 1000)  pantoprazole  (PROTONIX ) EC tablet 40 mg ( Oral Automatically Held 02/27/24 1000)  mupirocin  ointment (BACTROBAN ) 2 % 1 Application ( Nasal Automatically Held 02/16/24 1000)  lactated ringers  infusion ( Intravenous New Bag/Given 02/12/24 0700)  insulin  aspart (novoLOG ) injection 0-7 Units (has no administration in time range)  sodium chloride  0.9 % bolus 500 mL (0 mLs Intravenous Stopped 02/11/24 1438)   iohexol  (OMNIPAQUE ) 300 MG/ML solution 100 mL (100 mLs Intravenous Contrast Given 02/11/24 1242)  cefTRIAXone  (ROCEPHIN ) 2 g in sodium chloride  0.9 % 100 mL IVPB (2 g Intravenous New Bag/Given 02/11/24 1426)  metroNIDAZOLE (FLAGYL) IVPB 500 mg (500 mg Intravenous New Bag/Given 02/11/24 1431)  chlorhexidine  (PERIDEX ) 0.12 % solution 15 mL (15 mLs Mouth/Throat Given 02/12/24 0531)    Or  Oral care mouth rinse ( Mouth Rinse See Alternative 02/12/24 0531)                                                                                                                                     Procedures Procedures  (including critical care time)  Medical Decision Making / ED Course    Medical Decision Making:    Tierre Netto is a 73 y.o. male  with past medical history as below, significant for GERD, DM2, asthma, prostate cancer who presents to the ED with complaint of right lower quad abdominal pain. The complaint involves an extensive differential diagnosis and also carries with it a high risk of complications and morbidity.  Serious etiology was considered. Ddx includes but is not limited to: Differential diagnosis includes but is not exclusive to acute appendicitis, renal colic, testicular torsion, urinary tract infection, prostatitis,  diverticulitis, small bowel obstruction, colitis, abdominal aortic aneurysm, gastroenteritis, constipation etc.   Complete initial physical exam performed, notably the patient was in acute distress, resting comfortably.    Reviewed and confirmed nursing documentation for past medical history, family history, social history.  Vital signs reviewed.    Right lower quad abdominal pain Acute appendicitis w/ ?perf> - abd soft, nonperitoneal, he has some right lower quadrant tenderness and periumbilical tenderness. - Labs are stable - CTAP with acute appendicitis, ? perforation.  Inguinal Hernia w/o obstruction, adrenal nodule grossly unchanged - consult gen  surg > no thinners, he did have a slice of orange this morning around 10 AM - gen surg to take pt as primary, plan for OR In the AM                    Additional history obtained: -Additional history  obtained from na -External records from outside source obtained and reviewed including: Chart review including previous notes, labs, imaging, consultation notes including  Gen surg documentation Home meds   Lab Tests: -I ordered, reviewed, and interpreted labs.   The pertinent results include:   Labs Reviewed  COMPREHENSIVE METABOLIC PANEL WITH GFR - Abnormal; Notable for the following components:      Result Value   Sodium 133 (*)    Glucose, Bld 407 (*)    AST 14 (*)    All other components within normal limits  URINALYSIS, ROUTINE W REFLEX MICROSCOPIC - Abnormal; Notable for the following components:   Glucose, UA >=500 (*)    Bacteria, UA RARE (*)    All other components within normal limits  CBC - Abnormal; Notable for the following components:   Hemoglobin 12.9 (*)    All other components within normal limits  BASIC METABOLIC PANEL WITH GFR - Abnormal; Notable for the following components:   Glucose, Bld 101 (*)    All other components within normal limits  CBC - Abnormal; Notable for the following components:   RBC 4.07 (*)    Hemoglobin 12.3 (*)    HCT 37.4 (*)    All other components within normal limits  GLUCOSE, CAPILLARY - Abnormal; Notable for the following components:   Glucose-Capillary 210 (*)    All other components within normal limits  GLUCOSE, CAPILLARY - Abnormal; Notable for the following components:   Glucose-Capillary 105 (*)    All other components within normal limits  GLUCOSE, CAPILLARY - Abnormal; Notable for the following components:   Glucose-Capillary 130 (*)    All other components within normal limits  SURGICAL PCR SCREEN  CBC WITH DIFFERENTIAL/PLATELET  LIPASE, BLOOD  CREATININE, SERUM  MAGNESIUM     Notable for labs  stable  EKG   EKG Interpretation Date/Time:    Ventricular Rate:    PR Interval:    QRS Duration:    QT Interval:    QTC Calculation:   R Axis:      Text Interpretation:           Imaging Studies ordered: I ordered imaging studies including CTAP I independently visualized the following imaging with scope of interpretation limited to determining acute life threatening conditions related to emergency care; findings noted above I agree with the radiologist interpretation If any imaging was obtained with contrast I closely monitored patient for any possible adverse reaction a/w contrast administration in the emergency department   Medicines ordered and prescription drug management: Meds ordered this encounter  Medications   sodium chloride  0.9 % bolus 500 mL   iohexol  (OMNIPAQUE ) 300 MG/ML solution 100 mL   DISCONTD: cefTRIAXone  (ROCEPHIN ) 50 mg/kg in dextrose  5 % 50 mL IVPB    Antibiotic Indication::   Other Indication (list below)    Other Indication::   appendicitis   DISCONTD: metroNIDAZOLE (FLAGYL) IVPB 30 mg/kg    Antibiotic Indication::   Other Indication (list below)    Other Indication::   appendicitis   cefTRIAXone  (ROCEPHIN ) 2 g in sodium chloride  0.9 % 100 mL IVPB    Antibiotic Indication::   Intra-abdominal   metroNIDAZOLE (FLAGYL) IVPB 500 mg    Antibiotic Indication::   Intra-abdominal Infection   enoxaparin  (LOVENOX ) injection 40 mg   0.9 %  sodium chloride  infusion   piperacillin -tazobactam (ZOSYN ) IVPB 3.375 g    Antibiotic Indication::   Intra-abdominal Infection   OR Linked Order Group    acetaminophen  (TYLENOL ) tablet  650 mg    acetaminophen  (TYLENOL ) suppository 650 mg   oxyCODONE  (Oxy IR/ROXICODONE ) immediate release tablet 5-10 mg    Refill:  0   HYDROmorphone (DILAUDID) injection 0.5 mg   OR Linked Order Group    methocarbamol (ROBAXIN) tablet 500 mg    methocarbamol (ROBAXIN) injection 500 mg   OR Linked Order Group    diphenhydrAMINE  (BENADRYL) 12.5 MG/5ML elixir 12.5 mg    diphenhydrAMINE (BENADRYL) injection 12.5 mg   docusate sodium  (COLACE) capsule 100 mg   polyethylene glycol (MIRALAX / GLYCOLAX) packet 17 g   OR Linked Order Group    ondansetron  (ZOFRAN -ODT) disintegrating tablet 4 mg    ondansetron  (ZOFRAN ) injection 4 mg   simethicone  (MYLICON) chewable tablet 40 mg   metoprolol  tartrate (LOPRESSOR ) injection 5 mg   insulin  aspart (novoLOG ) injection 0-15 Units    Correction coverage::   Moderate (average weight, post-op)    CBG < 70::   Implement Hypoglycemia Standing Orders and refer to Hypoglycemia Standing Orders sidebar report    CBG 70 - 120::   0 units    CBG 121 - 150::   2 units    CBG 151 - 200::   3 units    CBG 201 - 250::   5 units    CBG 251 - 300::   8 units    CBG 301 - 350::   11 units    CBG 351 - 400::   15 units    CBG > 400:   call MD and obtain STAT lab verification   insulin  aspart (novoLOG ) injection 0-5 Units    Correction coverage::   HS scale    CBG < 70::   Implement Hypoglycemia Standing Orders and refer to Hypoglycemia Standing Orders sidebar report    CBG 70 - 120::   0 units    CBG 121 - 150::   0 units    CBG 151 - 200::   0 units    CBG 201 - 250::   2 units    CBG 251 - 300::   3 units    CBG 301 - 350::   4 units    CBG 351 - 400::   5 units    CBG > 400:   call MD and obtain STAT lab verification   atorvastatin (LIPITOR) tablet 20 mg   pantoprazole  (PROTONIX ) EC tablet 40 mg   mupirocin  ointment (BACTROBAN ) 2 % 1 Application   lactated ringers  infusion   OR Linked Order Group    chlorhexidine  (PERIDEX ) 0.12 % solution 15 mL    Oral care mouth rinse   insulin  aspart (novoLOG ) injection 0-7 Units    Correction coverage::   Periop Sensitive    CBG < 70::   Implement Hypoglycemia Standing Orders and refer to Hypoglycemia Standing Orders sidebar report    CBG 70 - 179::   0 units    CBG 180 - 219::   2 units    CBG 220 - 259::   3 units    CBG 260 - 299::   4  units    CBG 300 - 339::   5 units. Consult anesthesiologist prior to admin    CBG 340 - 379::   6 units. Consult anesthesiologist prior to admin    CBG 380 - 399::   7 units. Consult anesthesiologist prior to admin    CBG >399::   Notify anesthesiologist    -I have reviewed the patients home medicines and have  made adjustments as needed   Consultations Obtained: I requested consultation with the gen surg,  and discussed lab and imaging findings as well as pertinent plan - they recommend: plan or tomorrow   Cardiac Monitoring: Continuous pulse oximetry interpreted by myself, 99% on RA.    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  1997 (15 YRS AGO)   STATES NO DRUG USE SINCE   History of head injury 2010-  CLOSED  W/ CONCUSSION--   NO RESIDUAL   Mild asthma SEASONAL -  PT HAS NO INHALER   Prostate cancer (HCC) 05/01/2011 BX   ADENOCARCINOMA,gLEASON= 3+3=6,VOLUNE=68.8CC ,PSA=7.40   Seasonal allergies       Dispostion: Disposition decision including need for hospitalization was considered, and patient admitted to the hospital.    Final Clinical Impression(s) / ED Diagnoses Final diagnoses:  Acute appendicitis, unspecified acute appendicitis type  Adrenal nodule  Unilateral recurrent inguinal hernia without obstruction or gangrene        Elnor Jayson LABOR, DO 02/12/24 9270

## 2024-02-11 NOTE — H&P (Signed)
 H&P Note  Lan Mcneill 08-05-1950  [id number].    Requesting MD: Jayson Pereyra, DO Chief Complaint/Reason for Consult:  Appendicitis   HPI:  Patient is a 73 year old male with PMH significant for prostate cancer s/p radiation, hx of drug use in remission, T2DM, known RIH and UH, known R adrenal mass who presented to the ED with RLQ abdominal pain. He reports pain intermittently over the last week. Initially he thought his hernia was larger and pain was just related to that. He is still able to reduce hernia and noted continued pain, bloating, poor appetite and some subjective fevers. Has not measured temperature at home. Denies nausea or vomiting. Had a BM this AM. No prior abdominal surgeries and not on any blood thinners. He sees Dr. Lyndel in our office regarding hernia and is scheduled to see him 11/21 to discuss elective hernia repair now that his A1c is improved. NKDA.   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  1997 (15 YRS AGO)   STATES NO DRUG USE SINCE   History of head injury 2010-  CLOSED  W/ CONCUSSION--   NO RESIDUAL   Mild asthma SEASONAL -  PT HAS NO INHALER   Prostate cancer (HCC) 05/01/2011 BX   ADENOCARCINOMA,gLEASON= 3+3=6,VOLUNE=68.8CC ,PSA=7.40   Seasonal allergies     Past Surgical History:  Procedure Laterality Date   CYSTOSCOPY  09/07/2011   Procedure: CYSTOSCOPY;  Surgeon: Oneil JAYSON Rafter, MD;  Location: [facility];  Service: Urology;  Laterality: N/A;  no seeds noted in bladder    RADIOACTIVE SEED IMPLANT  09/07/2011   Procedure: RADIOACTIVE SEED IMPLANT;  Surgeon: Oneil JAYSON Rafter, MD;  Location: [facility];  Service: Urology;  Laterality: N/A;  84  seeds implanted   ROTATOR CUFF REPAIR  01-10-2004   RIGHT    Social History:  reports that he quit smoking about 50 years ago. His smoking use included cigarettes. He has never used smokeless tobacco. He reports that he does not drink alcohol and does not use drugs.  Allergies: No Known Allergies  (Not in a hospital admission)   Blood pressure (!) 173/105, pulse 79, temperature (!) 97.5 F (36.4 C), temperature source Oral, resp. rate 18, SpO2 100%. Physical Exam:  General: pleasant, WD, WN male who is laying in bed in NAD HEENT: head is normocephalic, atraumatic.  Sclera are noninjected.  PERRL.  Ears and nose without any masses or lesions.  Mouth is pink and moist, edentulous of some teeth Heart: regular, rate, and rhythm.  Palpable radial and pedal pulses bilaterally Lungs: CTAB, no wheezes, rhonchi, or rales noted.  Respiratory effort nonlabored Abd: soft, TTP in RLQ without peritonitis, ND, small umbilical hernia is reducible, RIH is soft and reducible  MS: all 4 extremities are symmetrical with no cyanosis, clubbing, or edema. Skin: warm and dry with no masses, lesions, or rashes Neuro: Cranial nerves 2-12 grossly intact, sensation is normal throughout Psych:  A&Ox3 with an appropriate affect.   Results for orders placed or performed during the hospital encounter of 02/11/24 (from the past 48 hours)  CBC with Differential     Status: None   Collection Time: 02/11/24 10:14 AM  Result Value Ref Range   WBC 7.9 4.0 - 10.5 K/uL   RBC 4.50 4.22 - 5.81 MIL/uL   Hemoglobin 13.2 13.0 - 17.0 g/dL   HCT 58.9 60.9 - 47.9 %   MCV 91.1 80.0 - 100.0 fL   MCH 29.3 26.0 - 34.0 pg   MCHC 32.2 30.0 - 36.0 g/dL   RDW 87.1 88.4 - 84.4 %   Platelets 326 150 - 400 K/uL   nRBC 0.0 0.0 - 0.2 %   Neutrophils  Relative % 74 %   Neutro Abs 5.9 1.7 - 7.7 K/uL   Lymphocytes Relative 15 %   Lymphs Abs 1.2 0.7 - 4.0 K/uL   Monocytes Relative 9 %   Monocytes Absolute 0.7 0.1 - 1.0 K/uL   Eosinophils Relative 1 %   Eosinophils Absolute 0.1 0.0 - 0.5 K/uL   Basophils Relative 1 %   Basophils Absolute 0.0 0.0 - 0.1 K/uL   Immature Granulocytes 0 %   Abs Immature Granulocytes 0.03 0.00 - 0.07 K/uL    Comment: Performed at [facility], [address] [place], [place] [address]  Comprehensive metabolic panel     Status: Abnormal   Collection Time: 02/11/24 10:14 AM  Result Value Ref Range   Sodium 133 (L) 135 - 145 mmol/L   Potassium 4.3 3.5 - 5.1 mmol/L   Chloride 98 98 - 111 mmol/L   CO2 23 22 - 32 mmol/L   Glucose, Bld 407 (H) 70 - 99 mg/dL    Comment: Glucose reference range applies only to samples taken after fasting for at least 8 hours.   BUN 13 8 - 23 mg/dL   Creatinine, Ser 9.06 0.61 - 1.24 mg/dL   Calcium 9.2 8.9 - 89.6 mg/dL   Total Protein 7.4 6.5 - 8.1 g/dL   Albumin 4.1 3.5 - 5.0 g/dL   AST 14 (L) 15 - 41 U/L   ALT 15 0 - 44 U/L   Alkaline Phosphatase 74 38 - 126 U/L   Total Bilirubin 0.5 0.0 - 1.2 mg/dL   GFR, Estimated >39 >39 mL/min    Comment: (NOTE) Calculated using the CKD-EPI Creatinine Equation (2021)    Anion gap 12 5 - 15    Comment: Performed at [facility], [address], [place], [place] [address]  Lipase, blood     Status: None   Collection Time: 02/11/24 10:14 AM  Result Value Ref Range   Lipase 19 11 - 51 U/L    Comment: Performed at [facility], [address] [place], [place] [address]  Urinalysis, Routine w reflex microscopic -Urine, Clean Catch     Status: Abnormal   Collection Time: 02/11/24 10:14 AM  Result Value Ref Range   Color, Urine YELLOW YELLOW   APPearance CLEAR CLEAR   Specific Gravity, Urine 1.018 1.005 - 1.030   pH 5.0 5.0 - 8.0   Glucose, UA >=500 (A) NEGATIVE mg/dL   Hgb  urine dipstick NEGATIVE NEGATIVE   Bilirubin Urine NEGATIVE NEGATIVE   Ketones, ur NEGATIVE NEGATIVE mg/dL   Protein, ur NEGATIVE NEGATIVE mg/dL   Nitrite NEGATIVE NEGATIVE   Leukocytes,Ua NEGATIVE NEGATIVE   RBC / HPF 0-5 0 - 5 RBC/hpf  WBC, UA 0-5 0 - 5 WBC/hpf   Bacteria, UA RARE (A) NONE SEEN   Squamous Epithelial / HPF 0-5 0 - 5 /HPF   Mucus PRESENT     Comment: Performed at [facility], [address] [place], [place] [address]   CT ABDOMEN PELVIS W CONTRAST Result Date: 02/11/2024 EXAM: CT ABDOMEN AND PELVIS WITH CONTRAST 02/11/2024 12:54:54 PM TECHNIQUE: CT of the abdomen and pelvis was performed with the administration of 100 mL of iohexol  (OMNIPAQUE ) 300 MG/ML solution. Multiplanar reformatted images are provided for review. Automated exposure control, iterative reconstruction, and/or weight-based adjustment of the mA/kV was utilized to reduce the radiation dose to as low as reasonably achievable. COMPARISON: 07/02/2023 CLINICAL HISTORY: RLQ abdominal pain. RLQ abd pain; Omnipaque  300; 100 cc ; reports RLQ abdominal pain that started 1 week ago. Pt reports he ws able to reduce his hernia about 1 week ago. States he has not had a BM since last Monday. FINDINGS: LOWER CHEST: No acute abnormality. LIVER: The liver is unremarkable. GALLBLADDER AND BILE DUCTS: Gallbladder is unremarkable. No biliary ductal dilatation. SPLEEN: No acute abnormality. PANCREAS: No acute abnormality. ADRENAL GLANDS: 3.1 cm right adrenal mass is noted which is not significantly changed compared to prior exam. KIDNEYS, URETERS AND BLADDER: Left renal cortical scarring is noted. Renal cysts are noted. No stones in the kidneys or ureters. No hydronephrosis. No perinephric or periureteral stranding. Urinary bladder is unremarkable. GI AND BOWEL: The appendix is thickened with mucosal enhancement consistent with appendicitis. A 2.2 x 1.8 cm low density fluid collection is noted in the region of the  appendix concerning for abscess or contained perforation. Large right inguinal hernia is noted which contains loop of small bowel, but does not result in obstruction. Stomach demonstrates no acute abnormality. There is no bowel obstruction. PERITONEUM AND RETROPERITONEUM: No ascites. No free air. VASCULATURE: Aorta is normal in caliber. LYMPH NODES: No lymphadenopathy. REPRODUCTIVE ORGANS: Status post prosthetic brachytherapy seed placement. BONES AND SOFT TISSUES: No acute osseous abnormality. No focal soft tissue abnormality. IMPRESSION: 1. Acute appendicitis with associated abscess and/or  contained perforation. 2. Large right inguinal hernia containing a loop of small bowel without obstruction. 3. Right adrenal mass measuring 3.1 cm, not significantly changed from prior; recommend chemical-shift MR for further characterization. Electronically signed by: Lynwood Seip MD 02/11/2024 01:30 PM EDT RP Workstation: [id number]      Assessment/Plan Perforated appendicitis with abscess - CT with acute appendicitis with associated abscess vs contained perforation, large RIH containing loop of small bowel without obstruction, right adrenal mass not significantly changed from prior - no leukocytosis and HD stable, afebrile  - RLQ TTP on exam without peritonitis  - I do not think patient needs to go to the OR emergently tonight but reasonable to consider laparoscopic appendectomy tomorrow  - I have explained the procedure, risks, and aftercare of Laparoscopic Appendectomy.  Risks include but are not limited to anesthesia (MI, CVA, death, aspiration, prolonged intubation), bleeding, infection, wound problems, hernia, injury to surrounding structures (viscus, nerves, blood vessels, ureter), need for conversion to open procedure or ileocecectomy, post operative ileus or abscess, stump leak, stump appendicitis and increased risk of DVT/PE.   Chronic RIH and small umbilical hernia, reducible - planning elective hernia  repair with Dr. Lyndel, follow up appointment is scheduled for 11/21 T2DM - SSI, A1c 7.3 from 13 previously, follows with endocrinology  R adrenal mass - being worked up outpatient   Admit to observation. Plan for likely OR tomorrow  pending surgical attending evaluation.   FEN: CLD, NS @75cc /h, NPO after MN VTE: LMWH ID: rocephin /flagyl given in ED; Zosyn  to start today given 1 week of symptoms   Hx of prostate cancer s/p radiation   I reviewed ED provider notes, last 24 h vitals and pain scores, last 48 h intake and output, last 24 h labs and trends, last 24 h imaging results, and prior surgery outpatient notes.  This care required high  level of medical decision making.   Burnard JONELLE Louder, [facility] 02/11/2024, 2:44 PM Please see Amion for pager number during day hours 7:00am-4:30pm

## 2024-02-11 NOTE — ED Triage Notes (Signed)
 Pt reports RLQ abdominal pain that started 1 week ago. Pt reports he ws able to reduce his hernia about 1 week ago. States he has not had a BM since last Monday.

## 2024-02-11 NOTE — ED Notes (Signed)
 Patient informed that urine sample is needed. Toileting offered. Patient states unable to void at this time. Urinal left at bedside.

## 2024-02-11 NOTE — ED Notes (Signed)
Report given to short stay RN  

## 2024-02-11 NOTE — Anesthesia Preprocedure Evaluation (Addendum)
 Anesthesia Evaluation  Patient identified by MRN, date of birth, ID band Patient awake    Reviewed: Allergy & Precautions, NPO status , Patient's Chart, lab work & pertinent test results  Airway Mallampati: III  TM Distance: >3 FB Neck ROM: Full    Dental no notable dental hx. (+) Missing, Poor Dentition, Dental Advisory Given,    Pulmonary asthma (albuterol rarely used, well controlled) , former smoker   Pulmonary exam normal breath sounds clear to auscultation       Cardiovascular Normal cardiovascular exam Rhythm:Regular Rate:Normal     Neuro/Psych negative neurological ROS     GI/Hepatic Neg liver ROS,GERD  Controlled,,  Endo/Other  diabetes (on Trulicity, glucose 407 on admission), Poorly Controlled, Type 2, Oral Hypoglycemic Agents    Renal/GU      Musculoskeletal  (+) Arthritis ,  Oxy 10mg  for a few months for the belly pain, no oxy today    Abdominal   Peds  Hematology negative hematology ROS (+)   Anesthesia Other Findings Mounjaro LD: 10/20  Reproductive/Obstetrics                              Anesthesia Physical Anesthesia Plan  ASA: 3  Anesthesia Plan: General   Post-op Pain Management: Ofirmev  IV (intra-op)*   Induction: Intravenous  PONV Risk Score and Plan: Treatment may vary due to age or medical condition, Ondansetron , Dexamethasone  and Midazolam   Airway Management Planned: Oral ETT  Additional Equipment: None  Intra-op Plan:   Post-operative Plan: Extubation in OR  Informed Consent: I have reviewed the patients History and Physical, chart, labs and discussed the procedure including the risks, benefits and alternatives for the proposed anesthesia with the patient or authorized representative who has indicated his/her understanding and acceptance.     Dental advisory given  Plan Discussed with: CRNA  Anesthesia Plan Comments:          Anesthesia  Quick Evaluation

## 2024-02-12 ENCOUNTER — Observation Stay (HOSPITAL_COMMUNITY): Admitting: Anesthesiology

## 2024-02-12 ENCOUNTER — Encounter (HOSPITAL_COMMUNITY): Payer: Self-pay

## 2024-02-12 ENCOUNTER — Encounter (HOSPITAL_COMMUNITY)
Admission: EM | Disposition: A | Discharge status: Home / Self Care | Payer: Self-pay | Source: Home / Self Care | Attending: Emergency Medicine

## 2024-02-12 ENCOUNTER — Ambulatory Visit (HOSPITAL_COMMUNITY): Admitting: Anesthesiology

## 2024-02-12 DIAGNOSIS — Z87891 Personal history of nicotine dependence: Secondary | ICD-10-CM | POA: Diagnosis not present

## 2024-02-12 DIAGNOSIS — I10 Essential (primary) hypertension: Secondary | ICD-10-CM | POA: Diagnosis not present

## 2024-02-12 DIAGNOSIS — K35891 Other acute appendicitis without perforation, with gangrene: Secondary | ICD-10-CM

## 2024-02-12 LAB — CBC
HCT: 37.4 % — ABNORMAL LOW (ref 39.0–52.0)
Hemoglobin: 12.3 g/dL — ABNORMAL LOW (ref 13.0–17.0)
MCH: 30.2 pg (ref 26.0–34.0)
MCHC: 32.9 g/dL (ref 30.0–36.0)
MCV: 91.9 fL (ref 80.0–100.0)
Platelets: 297 K/uL (ref 150–400)
RBC: 4.07 MIL/uL — ABNORMAL LOW (ref 4.22–5.81)
RDW: 12.8 % (ref 11.5–15.5)
WBC: 8.2 K/uL (ref 4.0–10.5)
nRBC: 0 % (ref 0.0–0.2)

## 2024-02-12 LAB — BASIC METABOLIC PANEL WITH GFR
Anion gap: 8 (ref 5–15)
BUN: 8 mg/dL (ref 8–23)
CO2: 25 mmol/L (ref 22–32)
Calcium: 8.9 mg/dL (ref 8.9–10.3)
Chloride: 104 mmol/L (ref 98–111)
Creatinine, Ser: 0.8 mg/dL (ref 0.61–1.24)
GFR, Estimated: >60 mL/min (ref >60)
Glucose, Bld: 101 mg/dL — ABNORMAL HIGH (ref 70–99)
Potassium: 4.3 mmol/L (ref 3.5–5.1)
Sodium: 137 mmol/L (ref 135–145)

## 2024-02-12 LAB — GLUCOSE, CAPILLARY
Glucose-Capillary: 130 mg/dL — ABNORMAL HIGH (ref 70–99)
Glucose-Capillary: 119 mg/dL — ABNORMAL HIGH (ref 70–99)
Glucose-Capillary: 173 mg/dL — ABNORMAL HIGH (ref 70–99)

## 2024-02-12 LAB — MAGNESIUM: Magnesium: 2.1 mg/dL (ref 1.7–2.4)

## 2024-02-12 SURGERY — APPENDECTOMY, LAPAROSCOPIC
Anesthesia: General | Site: Abdomen

## 2024-02-12 MED ORDER — PROPOFOL 10 MG/ML IV BOLUS
INTRAVENOUS | Status: AC
  Filled 2024-02-12: qty 20

## 2024-02-12 MED ORDER — LIDOCAINE HCL (CARDIAC) PF 100 MG/5ML IV SOSY
PREFILLED_SYRINGE | INTRAVENOUS | Status: DC | PRN
  Administered 2024-02-12: 60 mg via INTRAVENOUS

## 2024-02-12 MED ORDER — FENTANYL CITRATE (PF) 100 MCG/2ML IJ SOLN
INTRAMUSCULAR | Status: DC | PRN
  Administered 2024-02-12 (×4): 50 ug via INTRAVENOUS

## 2024-02-12 MED ORDER — SUGAMMADEX SODIUM 200 MG/2ML IV SOLN
INTRAVENOUS | Status: AC
  Filled 2024-02-12: qty 2

## 2024-02-12 MED ORDER — POLYETHYLENE GLYCOL 3350 17 G PO PACK
17.0000 g | PACK | Freq: Every day | ORAL | 1 refills | Status: AC
Start: 2024-02-12 — End: ?

## 2024-02-12 MED ORDER — CHLORHEXIDINE GLUCONATE 0.12 % MT SOLN
15.0000 mL | Freq: Once | OROMUCOSAL | Status: AC
  Administered 2024-02-12: 15 mL via OROMUCOSAL

## 2024-02-12 MED ORDER — ROCURONIUM BROMIDE 100 MG/10ML IV SOLN
INTRAVENOUS | Status: DC | PRN
  Administered 2024-02-12: 10 mg via INTRAVENOUS
  Administered 2024-02-12: 60 mg via INTRAVENOUS

## 2024-02-12 MED ORDER — FENTANYL CITRATE (PF) 100 MCG/2ML IJ SOLN
INTRAMUSCULAR | Status: AC
  Filled 2024-02-12: qty 2

## 2024-02-12 MED ORDER — OXYCODONE HCL 5 MG PO TABS: 5.0000 mg | ORAL_TABLET | ORAL | 0 refills | Status: AC | PRN

## 2024-02-12 MED ORDER — LIDOCAINE HCL (PF) 2 % IJ SOLN
INTRAMUSCULAR | Status: AC
  Filled 2024-02-12: qty 5

## 2024-02-12 MED ORDER — BUPIVACAINE-EPINEPHRINE (PF) 0.25% -1:200000 IJ SOLN
INTRAMUSCULAR | Status: AC
  Filled 2024-02-12: qty 30

## 2024-02-12 MED ORDER — ACETAMINOPHEN 500 MG PO TABS: 1000.0000 mg | ORAL_TABLET | Freq: Four times a day (QID) | ORAL | 3 refills | Status: AC

## 2024-02-12 MED ORDER — SUGAMMADEX SODIUM 200 MG/2ML IV SOLN
INTRAVENOUS | Status: DC | PRN
  Administered 2024-02-12: 200 mg via INTRAVENOUS

## 2024-02-12 MED ORDER — ONDANSETRON HCL 4 MG/2ML IJ SOLN
INTRAMUSCULAR | Status: DC | PRN
Start: 2024-02-12 — End: 2024-02-12
  Administered 2024-02-12: 4 mg via INTRAVENOUS

## 2024-02-12 MED ORDER — PHENYLEPHRINE HCL (PRESSORS) 10 MG/ML IV SOLN
INTRAVENOUS | Status: DC | PRN
  Administered 2024-02-12 (×2): 80 ug via INTRAVENOUS

## 2024-02-12 MED ORDER — PHENYLEPHRINE HCL (PRESSORS) 10 MG/ML IV SOLN
INTRAVENOUS | Status: AC
  Filled 2024-02-12: qty 1

## 2024-02-12 MED ORDER — INSULIN ASPART 100 UNIT/ML IJ SOLN: 0.0000 [IU] | INTRAMUSCULAR | Status: DC | PRN

## 2024-02-12 MED ORDER — HYDROMORPHONE HCL 1 MG/ML IJ SOLN
INTRAMUSCULAR | Status: AC
  Filled 2024-02-12: qty 1

## 2024-02-12 MED ORDER — ORAL CARE MOUTH RINSE: 15.0000 mL | Freq: Once | OROMUCOSAL | Status: AC

## 2024-02-12 MED ORDER — ACETAMINOPHEN 10 MG/ML IV SOLN
INTRAVENOUS | Status: AC
  Filled 2024-02-12: qty 100

## 2024-02-12 MED ORDER — IBUPROFEN 600 MG PO TABS: 600.0000 mg | ORAL_TABLET | Freq: Four times a day (QID) | ORAL | 1 refills | Status: AC

## 2024-02-12 MED ORDER — METHOCARBAMOL 750 MG PO TABS: 750.0000 mg | ORAL_TABLET | Freq: Four times a day (QID) | ORAL | 1 refills | Status: AC

## 2024-02-12 MED ORDER — OXYCODONE HCL 5 MG/5ML PO SOLN: 5.0000 mg | Freq: Once | ORAL | Status: DC | PRN

## 2024-02-12 MED ORDER — HYDROMORPHONE HCL 1 MG/ML IJ SOLN
0.2500 mg | INTRAMUSCULAR | Status: DC | PRN
  Administered 2024-02-12 (×4): 0.5 mg via INTRAVENOUS

## 2024-02-12 MED ORDER — BUPIVACAINE-EPINEPHRINE (PF) 0.25% -1:200000 IJ SOLN
INTRAMUSCULAR | Status: DC | PRN
  Administered 2024-02-12: 30 mL

## 2024-02-12 MED ORDER — PHENYLEPHRINE 80 MCG/ML (10ML) SYRINGE FOR IV PUSH (FOR BLOOD PRESSURE SUPPORT)
PREFILLED_SYRINGE | INTRAVENOUS | Status: AC
  Filled 2024-02-12: qty 10

## 2024-02-12 MED ORDER — OXYCODONE HCL 5 MG PO TABS: 5.0000 mg | ORAL_TABLET | Freq: Once | ORAL | Status: DC | PRN

## 2024-02-12 MED ORDER — AMISULPRIDE (ANTIEMETIC) 5 MG/2ML IV SOLN: 10.0000 mg | Freq: Once | INTRAVENOUS | Status: DC | PRN

## 2024-02-12 MED ORDER — ROCURONIUM BROMIDE 10 MG/ML (PF) SYRINGE
PREFILLED_SYRINGE | INTRAVENOUS | Status: AC
  Filled 2024-02-12: qty 10

## 2024-02-12 MED ORDER — EPHEDRINE SULFATE (PRESSORS) 25 MG/5ML IV SOSY
PREFILLED_SYRINGE | INTRAVENOUS | Status: DC | PRN
  Administered 2024-02-12: 10 mg via INTRAVENOUS

## 2024-02-12 MED ORDER — PROPOFOL 10 MG/ML IV BOLUS
INTRAVENOUS | Status: DC | PRN
  Administered 2024-02-12: 130 mg via INTRAVENOUS

## 2024-02-12 MED ORDER — LACTATED RINGERS IV SOLN: INTRAVENOUS | Status: DC | PRN

## 2024-02-12 MED ORDER — ONDANSETRON HCL 4 MG/2ML IJ SOLN: 4.0000 mg | Freq: Once | INTRAMUSCULAR | Status: DC | PRN

## 2024-02-12 MED ORDER — DEXAMETHASONE SOD PHOSPHATE PF 10 MG/ML IJ SOLN
INTRAMUSCULAR | Status: DC | PRN
  Administered 2024-02-12: 10 mg via INTRAVENOUS

## 2024-02-12 MED ORDER — ONDANSETRON HCL 4 MG/2ML IJ SOLN
INTRAMUSCULAR | Status: AC
  Filled 2024-02-12: qty 2

## 2024-02-12 MED ORDER — ACETAMINOPHEN 10 MG/ML IV SOLN
INTRAVENOUS | Status: DC | PRN
  Administered 2024-02-12: 1000 mg via INTRAVENOUS

## 2024-02-12 MED ORDER — LACTATED RINGERS IV SOLN: INTRAVENOUS | Status: DC

## 2024-02-12 SURGICAL SUPPLY — 35 items
BAG COUNTER SPONGE SURGICOUNT (BAG) IMPLANT
CHLORAPREP W/TINT 26 (MISCELLANEOUS) ×2 IMPLANT
CLIP APPLIE ROT 10 11.4 M/L (STAPLE) IMPLANT
COVER SURGICAL LIGHT HANDLE (MISCELLANEOUS) ×2 IMPLANT
CUTTER ECHEON FLEX ENDO 45 340 (ENDOMECHANICALS) IMPLANT
DERMABOND ADVANCED .7 DNX12 (GAUZE/BANDAGES/DRESSINGS) ×2 IMPLANT
ELECT PENCIL ROCKER SW 15FT (MISCELLANEOUS) IMPLANT
ELECT REM PT RETURN 15FT ADLT (MISCELLANEOUS) ×2 IMPLANT
ENDOLOOP SUT PDS II 0 18 (SUTURE) IMPLANT
GLOVE BIO SURGEON STRL SZ 6.5 (GLOVE) ×2 IMPLANT
GLOVE BIOGEL PI IND STRL 6 (GLOVE) ×2 IMPLANT
GOWN STRL REUS W/ TWL LRG LVL3 (GOWN DISPOSABLE) ×2 IMPLANT
GOWN STRL REUS W/ TWL XL LVL3 (GOWN DISPOSABLE) IMPLANT
IRRIGATION SUCT STRKRFLW 2 WTP (MISCELLANEOUS) IMPLANT
KIT BASIN OR (CUSTOM PROCEDURE TRAY) ×2 IMPLANT
KIT TURNOVER KIT A (KITS) ×2 IMPLANT
NDL INSUFFLATION 14GA 120MM (NEEDLE) IMPLANT
NEEDLE INSUFFLATION 14GA 120MM (NEEDLE) IMPLANT
POUCH RETRIEVAL ECOSAC 10 (ENDOMECHANICALS) ×2 IMPLANT
RELOAD STAPLE 45 2.6 WHT THIN (STAPLE) IMPLANT
RELOAD STAPLE 45 3.6 BLU REG (STAPLE) IMPLANT
SCISSORS LAP 5X35 DISP (ENDOMECHANICALS) IMPLANT
SET TUBE SMOKE EVAC HIGH FLOW (TUBING) ×2 IMPLANT
SHEARS HARMONIC 36 ACE (MISCELLANEOUS) IMPLANT
SLEEVE ADV FIXATION 5X100MM (TROCAR) ×2 IMPLANT
SPIKE FLUID TRANSFER (MISCELLANEOUS) ×2 IMPLANT
SUT MNCRL AB 4-0 PS2 18 (SUTURE) ×2 IMPLANT
SUT VIC AB 0 UR5 27 (SUTURE) IMPLANT
SUT VICRYL 0 UR6 27IN ABS (SUTURE) ×2 IMPLANT
TOWEL OR 17X26 10 PK STRL BLUE (TOWEL DISPOSABLE) ×2 IMPLANT
TRAY FOLEY MTR SLVR 14FR STAT (SET/KITS/TRAYS/PACK) IMPLANT
TRAY FOLEY MTR SLVR 16FR STAT (SET/KITS/TRAYS/PACK) IMPLANT
TRAY LAPAROSCOPIC (CUSTOM PROCEDURE TRAY) ×2 IMPLANT
TROCAR ADV FIXATION 5X100MM (TROCAR) ×2 IMPLANT
TROCAR BALLN 12MMX100 BLUNT (TROCAR) ×2 IMPLANT

## 2024-02-12 NOTE — Discharge Summary (Signed)
 [facility] Discharge Summary   Cole Conrad ID: Cole Conrad MRN: [id number] DOB/AGE: 71-Sep-1952 71 y.o.  Admit date: 02/11/2024 Discharge date: 02/12/2024  Admitting Diagnosis: Acute appendicitis Chronic right inguinal hernia without obstruction  Discharge Diagnosis Same as above  Consultants None   Imaging: CT ABDOMEN PELVIS W CONTRAST Result Date: 02/11/2024 EXAM: CT ABDOMEN AND PELVIS WITH CONTRAST 02/11/2024 12:54:54 PM TECHNIQUE: CT of the abdomen and pelvis was performed with the administration of 100 mL of iohexol  (OMNIPAQUE ) 300 MG/ML solution. Multiplanar reformatted images are provided for review. Automated exposure control, iterative reconstruction, and/or weight-based adjustment of the mA/kV was utilized to reduce the radiation dose to as low as reasonably achievable. COMPARISON: 07/02/2023 CLINICAL HISTORY: RLQ abdominal pain. RLQ abd pain; Omnipaque  300; 100 cc ; reports RLQ abdominal pain that started 1 week ago. Pt reports he ws able to reduce his hernia about 1 week ago. States he has not had a BM since last Monday. FINDINGS: LOWER CHEST: No acute abnormality. LIVER: The liver is unremarkable. GALLBLADDER AND BILE DUCTS: Gallbladder is unremarkable. No biliary ductal dilatation. SPLEEN: No acute abnormality. PANCREAS: No acute abnormality. ADRENAL GLANDS: 3.1 cm right adrenal mass is noted which is not significantly changed compared to prior exam. KIDNEYS, URETERS AND BLADDER: Left renal cortical scarring is noted. Renal cysts are noted. No stones in the kidneys or ureters. No hydronephrosis. No perinephric or periureteral stranding. Urinary bladder is unremarkable. GI AND BOWEL: The appendix is thickened with mucosal enhancement consistent with appendicitis. A 2.2 x 1.8 cm low density fluid collection is noted in the region of the appendix concerning for abscess or contained perforation. Large right inguinal hernia is noted which contains loop of small bowel, but  does not result in obstruction. Stomach demonstrates no acute abnormality. There is no bowel obstruction. PERITONEUM AND RETROPERITONEUM: No ascites. No free air. VASCULATURE: Aorta is normal in caliber. LYMPH NODES: No lymphadenopathy. REPRODUCTIVE ORGANS: Status post prosthetic brachytherapy seed placement. BONES AND SOFT TISSUES: No acute osseous abnormality. No focal soft tissue abnormality. IMPRESSION: 1. Acute appendicitis with associated abscess and/or  contained perforation. 2. Large right inguinal hernia containing a loop of small bowel without obstruction. 3. Right adrenal mass measuring 3.1 cm, not significantly changed from prior; recommend chemical-shift MR for further characterization. Electronically signed by: Lynwood Seip MD 02/11/2024 01:30 PM EDT RP Workstation: [id number]    Procedures Dr. Dreama Hanger (02/12/24) - Laparoscopic Appendectomy  Hospital Course:  Cole Conrad is a 73 year old male who presented to the ED with abdominal pain.  Workup showed acute appendicitis.  Cole Conrad was admitted and underwent procedure listed above.  Tolerated procedure well and was transferred to the floor.  Diet was advanced as tolerated.  On POD0, the Cole Conrad was voiding well, tolerating diet, ambulating well, pain well controlled, vital signs stable, incisions c/d/i and felt stable for discharge home.  Cole Conrad will follow up in our office as outlined below and is aware to call with questions or concerns.   Physical Exam: General:  Alert, NAD, pleasant, comfortable Abd:  Soft, ND, mild tenderness, incisions C/D/I  I or a member of my team have reviewed this Cole Conrad in the Controlled Substance Database.   Allergies as of 02/12/2024   No Known Allergies      Medication List     TAKE these medications    acetaminophen  500 MG tablet Commonly known as: TYLENOL  Take 2 tablets (1,000 mg total) by mouth 4 (four) times daily.   albuterol 108 (90 Base) MCG/ACT inhaler  Commonly known as: VENTOLIN  HFA Inhale 2 puffs into the lungs every 6 (six) hours as needed for wheezing or shortness of breath.   atorvastatin 20 MG tablet Commonly known as: LIPITOR Take 20 mg by mouth daily.   diclofenac  Sodium 1 % Gel Commonly known as: VOLTAREN  Apply 4 g topically 4 (four) times daily.   glipiZIDE 10 MG 24 hr tablet Commonly known as: GLUCOTROL XL Take 20 mg by mouth daily.   ibuprofen 600 MG tablet Commonly known as: ADVIL Take 1 tablet (600 mg total) by mouth 4 (four) times daily.   Jardiance 25 MG Tabs tablet Generic drug: empagliflozin Take 25 mg by mouth every morning.   ketoconazole  2 % cream Commonly known as: NIZORAL  Apply to affected area of both feet once daily for four weeks.   metFORMIN 500 MG 24 hr tablet Commonly known as: GLUCOPHAGE-XR Take 500 mg by mouth every morning.   methocarbamol 750 MG tablet Commonly known as: ROBAXIN Take 1 tablet (750 mg total) by mouth 4 (four) times daily.   Mounjaro 10 MG/0.5ML Pen Generic drug: tirzepatide Inject 10 mg into the skin every Monday.   multivitamin with minerals Tabs tablet Take 1 tablet by mouth daily with breakfast.   omeprazole 40 MG capsule Commonly known as: PRILOSEC Take 40 mg by mouth daily before breakfast.   ondansetron  4 MG tablet Commonly known as: ZOFRAN  Take 4 mg by mouth every 8 (eight) hours as needed for nausea or vomiting.   Oxycodone  HCl 10 MG Tabs Take 10 mg by mouth every 8 (eight) hours. What changed: Another medication with the same name was added. Make sure you understand how and when to take each.   oxyCODONE  5 MG immediate release tablet Commonly known as: Roxicodone  Take 1 tablet (5 mg total) by mouth every 4 (four) hours as needed. What changed: You were already taking a medication with the same name, and this prescription was added. Make sure you understand how and when to take each.   polyethylene glycol 17 g packet Commonly known as: MiraLax Take 17 g by mouth daily.    tamsulosin  0.4 MG Caps capsule Commonly known as: FLOMAX  Take 0.4 mg by mouth daily after breakfast.   triamcinolone  ointment 0.1 % Commonly known as: KENALOG  Apply 1 Application topically 2 (two) times daily as needed (for itching- apply sparingly).          Follow-up Information     Stechschulte, Deward PARAS, MD. Go on 03/06/2024.   Specialty: Surgery Why: 2:10 PM, please arrive 30 min prior to appointment time to check in. For post-op follow up and to discuss hernia repair. Contact information: [address] [place] [address] [phone]                 Signed: Burnard JONELLE Louder , [facility] 02/12/2024, 1:01 PM Please see Amion for pager number during day hours 7:00am-4:30pm

## 2024-02-12 NOTE — Anesthesia Procedure Notes (Signed)
 Procedure Name: Intubation Date/Time: 02/12/2024 9:55 AM  Performed by: Obadiah Reyes BROCKS, CRNAPre-anesthesia Checklist: Patient identified, Emergency Drugs available, Suction available and Patient being monitored Patient Re-evaluated:Patient Re-evaluated prior to induction Oxygen Delivery Method: Circle System Utilized Preoxygenation: Pre-oxygenation with 100% oxygen Induction Type: IV induction Ventilation: Mask ventilation without difficulty Laryngoscope Size: Mac and 3 Grade View: Grade I Tube type: Oral Number of attempts: 1 Airway Equipment and Method: Stylet and Oral airway Placement Confirmation: ETT inserted through vocal cords under direct vision, positive ETCO2 and breath sounds checked- equal and bilateral Secured at: 23 cm Tube secured with: Tape Dental Injury: Teeth and Oropharynx as per pre-operative assessment

## 2024-02-12 NOTE — Op Note (Signed)
   Operative Note   Date: 02/12/2024  Procedure: laparoscopic appendectomy  Pre-op diagnosis: acute appendicitis Post-op diagnosis: Grade 1c appendicitis: gangrenous/necrotic appendix without perforation  Indication and clinical history: The patient is a 73 y.o. year old male with acute appendicitis     Surgeon: Dreama GEANNIE Hanger, MD  Anesthesiologist: Merla, DO Anesthesia: General  Findings:  Specimen: appendix EBL: <5cc Drains/Implants: none  Disposition: PACU - hemodynamically stable.  Description of procedure: The patient was positioned supine on the operating room table. Time-out was performed verifying correct patient, procedure, signature of informed consent, and administration of pre-operative antibiotics. General anesthetic induction and intubation were uneventful. Foley catheter insertion was not performed as patient voided immediately prior to the procedure . The abdomen was prepped and draped in the usual sterile fashion. An infra-umbilical incision was made using an open technique using zero vicryl stay sutures on either side of the fascia and a 10mm Hassan port inserted. After establishing pneumoperitoneum, which the patient tolerated well, the abdominal cavity was inspected and no injury of any intra-abdominal structures was identified. Two additional five millimeter ports were placed under direct visualization and using local anesthetic in the suprapubic and left lower quadrant regions. The patient was repositioned to Trendelenburg with the left side down. Further inspection of the right lower quadrant revealed grade 1c appendicitis. Adhesiolysis was performed for approximately 10 minutes during dissection, mobilization, and identification of the appendix. The appendix was dissected away from its mesoappendix and an endoscopic stapler used to divide the mesoappendix using a vascular load. A bowel load of the endoscopic stapler was used to staple across the appendix at its  base. Both staple lines were inspected and found to be intact and without bleeding. The appendix was placed in an endoscopic specimen retrieval bag, removed via the umbilical port site, and sent to pathology as a permanent specimen. The right lower quadrant was again inspected and hemostasis confirmed. The suprapubic and left lower quadrant ports were removed under direct visualization and hemostasis confirmed. The umbilical port was removed last after desufflating the abdomen and the fascia re-approximated using the stay sutures. Additional local anesthetic was administered at the umbilical incision site. The skin of all port sites was closed with 4-0 monocryl. Sterile dressings were applied. All sponge and instrument counts were correct at the conclusion of the procedure. The patient was awakened from anesthesia, extubated uneventfully, and transported to the PACU in good condition. There were no complications.   Upon entering the abdomen (organ space), I encountered infection of the appendix.  CASE DATA:  Type of patient?: LDOW CASE (Surgical Hospitalist [facility] Inpatient)  Status of Case? URGENT Add On  Infection Present At Time Of Surgery (PATOS)?  INFECTION of the appendix   Dreama GEANNIE Hanger, MD General and Trauma Surgery [facility]

## 2024-02-12 NOTE — Transfer of Care (Signed)
 Immediate Anesthesia Transfer of Care Note  Patient: Cole Conrad  Procedure(s) Performed: APPENDECTOMY, LAPAROSCOPIC (Abdomen)  Patient Location: PACU  Anesthesia Type:General  Level of Consciousness: awake, alert , and oriented  Airway & Oxygen Therapy: Patient Spontanous Breathing and Patient connected to face mask oxygen  Post-op Assessment: Report given to RN and Post -op Vital signs reviewed and stable  Post vital signs: Reviewed and stable  Last Vitals:  Vitals Value Taken Time  BP 151/84 02/12/24 11:15  Temp 37 C 02/12/24 11:15  Pulse 61 02/12/24 11:17  Resp 22 02/12/24 11:17  SpO2 100 % 02/12/24 11:17  Vitals shown include unfiled device data.  Last Pain:  Vitals:   02/12/24 0645  TempSrc: Oral  PainSc: 0-No pain         Complications: No notable events documented.

## 2024-02-12 NOTE — Progress Notes (Signed)
 General Surgery Follow Up Note  Subjective:    Overnight Issues:   Objective:  Vital signs for last 24 hours: Temp:  [97.5 F (36.4 C)-98.6 F (37 C)] 98.2 F (36.8 C) (10/29 0645) Pulse Rate:  [53-79] 56 (10/29 0645) Resp:  [16-18] 18 (10/29 0645) BP: (95-173)/(68-105) 119/77 (10/29 0645) SpO2:  [98 %-100 %] 99 % (10/29 0645) Weight:  [72.1 kg] 72.1 kg (10/29 0645)  Hemodynamic parameters for last 24 hours:    Intake/Output from previous day: 10/28 0701 - 10/29 0700 In: 2078.5 [P.O.:720; I.V.:667.5; IV Piggyback:691.1] Out: 250 [Urine:250]  Intake/Output this shift: No intake/output data recorded.  Vent settings for last 24 hours:    Physical Exam:  Gen: comfortable, no distress Neuro: follows commands, alert, communicative HEENT: PERRL Neck: supple CV: RRR Pulm: unlabored breathing on RA Abd: soft, NT   GU: urine clear and yellow, +spontaneous void Extr: wwp, no edema  Results for orders placed or performed during the hospital encounter of 02/11/24 (from the past 24 hours)  CBC with Differential     Status: None   Collection Time: 02/11/24 10:14 AM  Result Value Ref Range   WBC 7.9 4.0 - 10.5 K/uL   RBC 4.50 4.22 - 5.81 MIL/uL   Hemoglobin 13.2 13.0 - 17.0 g/dL   HCT 58.9 60.9 - 47.9 %   MCV 91.1 80.0 - 100.0 fL   MCH 29.3 26.0 - 34.0 pg   MCHC 32.2 30.0 - 36.0 g/dL   RDW 87.1 88.4 - 84.4 %   Platelets 326 150 - 400 K/uL   nRBC 0.0 0.0 - 0.2 %   Neutrophils Relative % 74 %   Neutro Abs 5.9 1.7 - 7.7 K/uL   Lymphocytes Relative 15 %   Lymphs Abs 1.2 0.7 - 4.0 K/uL   Monocytes Relative 9 %   Monocytes Absolute 0.7 0.1 - 1.0 K/uL   Eosinophils Relative 1 %   Eosinophils Absolute 0.1 0.0 - 0.5 K/uL   Basophils Relative 1 %   Basophils Absolute 0.0 0.0 - 0.1 K/uL   Immature Granulocytes 0 %   Abs Immature Granulocytes 0.03 0.00 - 0.07 K/uL  Comprehensive metabolic panel     Status: Abnormal   Collection Time: 02/11/24 10:14 AM  Result Value Ref  Range   Sodium 133 (L) 135 - 145 mmol/L   Potassium 4.3 3.5 - 5.1 mmol/L   Chloride 98 98 - 111 mmol/L   CO2 23 22 - 32 mmol/L   Glucose, Bld 407 (H) 70 - 99 mg/dL   BUN 13 8 - 23 mg/dL   Creatinine, Ser 9.06 0.61 - 1.24 mg/dL   Calcium 9.2 8.9 - 89.6 mg/dL   Total Protein 7.4 6.5 - 8.1 g/dL   Albumin 4.1 3.5 - 5.0 g/dL   AST 14 (L) 15 - 41 U/L   ALT 15 0 - 44 U/L   Alkaline Phosphatase 74 38 - 126 U/L   Total Bilirubin 0.5 0.0 - 1.2 mg/dL   GFR, Estimated >39 >39 mL/min   Anion gap 12 5 - 15  Lipase, blood     Status: None   Collection Time: 02/11/24 10:14 AM  Result Value Ref Range   Lipase 19 11 - 51 U/L  Urinalysis, Routine w reflex microscopic -Urine, Clean Catch     Status: Abnormal   Collection Time: 02/11/24 10:14 AM  Result Value Ref Range   Color, Urine YELLOW YELLOW   APPearance CLEAR CLEAR   Specific Gravity, Urine  1.018 1.005 - 1.030   pH 5.0 5.0 - 8.0   Glucose, UA >=500 (A) NEGATIVE mg/dL   Hgb urine dipstick NEGATIVE NEGATIVE   Bilirubin Urine NEGATIVE NEGATIVE   Ketones, ur NEGATIVE NEGATIVE mg/dL   Protein, ur NEGATIVE NEGATIVE mg/dL   Nitrite NEGATIVE NEGATIVE   Leukocytes,Ua NEGATIVE NEGATIVE   RBC / HPF 0-5 0 - 5 RBC/hpf   WBC, UA 0-5 0 - 5 WBC/hpf   Bacteria, UA RARE (A) NONE SEEN   Squamous Epithelial / HPF 0-5 0 - 5 /HPF   Mucus PRESENT   CBC     Status: Abnormal   Collection Time: 02/11/24  3:55 PM  Result Value Ref Range   WBC 7.9 4.0 - 10.5 K/uL   RBC 4.34 4.22 - 5.81 MIL/uL   Hemoglobin 12.9 (L) 13.0 - 17.0 g/dL   HCT 60.1 60.9 - 47.9 %   MCV 91.7 80.0 - 100.0 fL   MCH 29.7 26.0 - 34.0 pg   MCHC 32.4 30.0 - 36.0 g/dL   RDW 87.2 88.4 - 84.4 %   Platelets 307 150 - 400 K/uL   nRBC 0.0 0.0 - 0.2 %  Creatinine, serum     Status: None   Collection Time: 02/11/24  3:55 PM  Result Value Ref Range   Creatinine, Ser 0.86 0.61 - 1.24 mg/dL   GFR, Estimated >39 >39 mL/min  Surgical PCR screen     Status: None   Collection Time: 02/11/24   5:08 PM   Specimen: Nasal Mucosa; Nasal Swab  Result Value Ref Range   MRSA, PCR NEGATIVE NEGATIVE   Staphylococcus aureus NEGATIVE NEGATIVE  Glucose, capillary     Status: Abnormal   Collection Time: 02/11/24  5:31 PM  Result Value Ref Range   Glucose-Capillary 210 (H) 70 - 99 mg/dL  Glucose, capillary     Status: Abnormal   Collection Time: 02/11/24  9:59 PM  Result Value Ref Range   Glucose-Capillary 105 (H) 70 - 99 mg/dL  Basic metabolic panel     Status: Abnormal   Collection Time: 02/12/24  5:17 AM  Result Value Ref Range   Sodium 137 135 - 145 mmol/L   Potassium 4.3 3.5 - 5.1 mmol/L   Chloride 104 98 - 111 mmol/L   CO2 25 22 - 32 mmol/L   Glucose, Bld 101 (H) 70 - 99 mg/dL   BUN 8 8 - 23 mg/dL   Creatinine, Ser 9.19 0.61 - 1.24 mg/dL   Calcium 8.9 8.9 - 89.6 mg/dL   GFR, Estimated >39 >39 mL/min   Anion gap 8 5 - 15  CBC     Status: Abnormal   Collection Time: 02/12/24  5:17 AM  Result Value Ref Range   WBC 8.2 4.0 - 10.5 K/uL   RBC 4.07 (L) 4.22 - 5.81 MIL/uL   Hemoglobin 12.3 (L) 13.0 - 17.0 g/dL   HCT 62.5 (L) 60.9 - 47.9 %   MCV 91.9 80.0 - 100.0 fL   MCH 30.2 26.0 - 34.0 pg   MCHC 32.9 30.0 - 36.0 g/dL   RDW 87.1 88.4 - 84.4 %   Platelets 297 150 - 400 K/uL   nRBC 0.0 0.0 - 0.2 %  Magnesium      Status: None   Collection Time: 02/12/24  5:17 AM  Result Value Ref Range   Magnesium  2.1 1.7 - 2.4 mg/dL  Glucose, capillary     Status: Abnormal   Collection Time: 02/12/24  6:33 AM  Result Value Ref Range   Glucose-Capillary 130 (H) 70 - 99 mg/dL  Glucose, capillary     Status: Abnormal   Collection Time: 02/12/24  8:38 AM  Result Value Ref Range   Glucose-Capillary 119 (H) 70 - 99 mg/dL   Comment 1 Notify RN     Assessment & Plan: Present on Admission:  Acute appendicitis    LOS: 0 days   Additional comments:I reviewed the patient's new clinical lab test results.   and I reviewed the patients new imaging test results.    Acute appendicitis - plan  lap appy. Informed consent was obtained after detailed explanation of risks, including bleeding, infection, abscess, staple line leak, stump appendicitis, injury to surrounding structures, and need for conversion to open procedure. All questions answered to the patient's satisfaction. FEN - NPO except sips/chips DVT - SCDs, LMWH Dispo - med-surg    Dreama GEANNIE Hanger, MD Trauma & General Surgery Please use [email or web address] to contact on call provider  02/12/2024  *Care during the described time interval was provided by me. I have reviewed this patient's available data, including medical history, events of note, physical examination and test results as part of my evaluation.

## 2024-02-12 NOTE — Anesthesia Postprocedure Evaluation (Signed)
 Anesthesia Post Note  Patient: Cole Conrad  Procedure(s) Performed: APPENDECTOMY, LAPAROSCOPIC (Abdomen)     Patient location during evaluation: PACU Anesthesia Type: General Level of consciousness: awake and alert, oriented and patient cooperative Pain management: pain level controlled Vital Signs Assessment: post-procedure vital signs reviewed and stable Respiratory status: spontaneous breathing, nonlabored ventilation and respiratory function stable Cardiovascular status: blood pressure returned to baseline and stable Postop Assessment: no apparent nausea or vomiting Anesthetic complications: no   No notable events documented.  Last Vitals:  Vitals:   02/12/24 1145 02/12/24 1200  BP: (!) 150/88 131/74  Pulse: 63 64  Resp: (!) 22 19  Temp: 36.9 C   SpO2: 100% 100%    Last Pain:  Vitals:   02/12/24 1200  TempSrc:   PainSc: Asleep                 [name]

## 2024-02-12 NOTE — Plan of Care (Signed)

## 2024-02-12 NOTE — Discharge Instructions (Signed)
 [facility]  LAPAROSCOPIC SURGERY: POST OP INSTRUCTIONS Always review your discharge instruction sheet given to you by the facility where your surgery was performed. IF YOU HAVE DISABILITY OR FAMILY LEAVE FORMS, YOU MUST BRING THEM TO THE OFFICE FOR PROCESSING.   DO NOT GIVE THEM TO YOUR DOCTOR.  PAIN CONTROL  Pain regimen: take over-the-counter tylenol  (acetaminophen ) 1000mg  every six hours, the prescription ibuprofen  (600mg ) every six hours and the robaxin  (methocarbamol ) 750mg  every six hours. With all three of these, you should be taking something every two hours. Example: tylenol  (acetaminophen ) at 8am, ibuprofen  at 10am, robaxin  (methocarbamol ) at 12pm, tylenol  (acetaminophen ) again at 2pm, ibuprofen  again at 4pm, robaxin  (methocarbamol ) at 6pm. You also have a prescription for oxycodone , which should be taken if the tylenol  (acetaminophen ), ibuprofen , and robaxin  (methocarbamol ) are not enough to control your pain. You may take the oxycodone  as frequently as every four hours as needed, but if you are taking the other medications as above, you should not need the oxycodone  this frequently. You have also been given a prescription for Miralax which is a stool softener. Please take this as prescribed because the oxycodone  can cause constipation and the Miralax will minimize or prevent constipation. Do not drive while taking or under the influence of the oxycodone  as it is a narcotic medication. Use ice packs to help control pain.  If you need a refill on your pain medication, please contact your pharmacy.  They will contact our office to request authorization. Prescriptions will not be filled after 5pm or on week-ends.  HOME MEDICATIONS Take your usually prescribed medications unless otherwise directed.  DIET You should follow a light diet the first few days after arrival home.  Be sure to include lots of fluids daily.  Do not consume alcohol while taking oxycodone  or  ibuprofen .   CONSTIPATION It is common to experience some constipation after surgery and if you are taking pain medication. Constipation will make your abdominal pain worse, so it is best to try to prevent it by increasing fluid intake and taking a stool softener. You have already been given a prescription for a mild laxative, Miralax, which you should be taking once daily. You can increase the Miralax to twice daily or even three times daily until you have a bowel movement. If still no bowel movement 24 hours after taking Miralax three times in one day, you may try magnesium  citrate, available over the counter at a local pharmacy.   WOUND/INCISION CARE Most patients will experience some swelling and bruising in the area of the incisions.  Ice packs will help.  Swelling and bruising can take several days to resolve.  May shower beginning 02/13/24.  Do not peel off or scrub skin glue. May allow warm soapy water to run over incision, then rinse and pat dry.  Do not soak in any water (tubs, hot tubs, pools, lakes, oceans) for one week.   ACTIVITIES You may resume regular (light) daily activities beginning the next day--such as daily self-care, walking, climbing stairs--gradually increasing activities as tolerated.  You may have sexual intercourse when it is comfortable.   No lifting greater than 5 pounds for six weeks.  You may drive when you are no longer taking narcotic pain medication, you can comfortably wear a seatbelt, and you can safely maneuver your car and apply brakes.  FOLLOW-UP You should see your doctor in the office for a follow-up appointment approximately 2-3 weeks after your surgery.  You should have  been given your post-op/follow-up appointment when your surgery was scheduled.  If you did not receive a post-op/follow-up appointment, make sure that you call for this appointment within a day or two after you arrive home to ensure a convenient appointment time.  WHEN TO CALL YOUR  DOCTOR: Fever over 101.5 Inability to urinate Continued bleeding from incision. Increased pain, redness, or drainage from the incision. Increasing abdominal pain  The clinic staff is available to answer your questions during regular business hours.  Please don't hesitate to call and ask to speak to one of the nurses for clinical concerns.  If you have a medical emergency, go to the nearest emergency room or call 911.  A surgeon from [facility] is always on call at the hospital. [address], [address], [place], [place]  [address] ? [address] [address], [place], [place]   [address] [phone] ? [phone] ? FAX [phone] Web site: [email or web address]

## 2024-02-12 NOTE — Plan of Care (Signed)
  Problem: Metabolic: Goal: Ability to maintain appropriate glucose levels will improve Outcome: Progressing   Problem: Skin Integrity: Goal: Risk for impaired skin integrity will decrease Outcome: Progressing   Problem: Clinical Measurements: Goal: Diagnostic test results will improve Outcome: Progressing   Problem: Pain Managment: Goal: General experience of comfort will improve and/or be controlled Outcome: Progressing

## 2024-02-13 ENCOUNTER — Encounter (HOSPITAL_COMMUNITY): Payer: Self-pay | Admitting: Surgery

## 2024-02-14 LAB — SURGICAL PATHOLOGY
